# Patient Record
Sex: Male | Born: 1965 | Race: White | Hispanic: No | Marital: Single | Smoking: Current every day smoker
Health system: Southern US, Community
[De-identification: ages and names within clinical notes are randomized; demographics above are authoritative.]

## PROBLEM LIST (undated history)

## (undated) DIAGNOSIS — E785 Hyperlipidemia, unspecified: Secondary | ICD-10-CM

## (undated) DIAGNOSIS — D496 Neoplasm of unspecified behavior of brain: Secondary | ICD-10-CM

## (undated) DIAGNOSIS — F419 Anxiety disorder, unspecified: Secondary | ICD-10-CM

## (undated) DIAGNOSIS — R202 Paresthesia of skin: Secondary | ICD-10-CM

## (undated) DIAGNOSIS — K219 Gastro-esophageal reflux disease without esophagitis: Secondary | ICD-10-CM

## (undated) DIAGNOSIS — D330 Benign neoplasm of brain, supratentorial: Secondary | ICD-10-CM

## (undated) DIAGNOSIS — I10 Essential (primary) hypertension: Secondary | ICD-10-CM

## (undated) HISTORY — DX: Paresthesia of skin: R20.2

## (undated) HISTORY — PX: TOTAL HIP ARTHROPLASTY: SHX124

## (undated) HISTORY — PX: BRAIN SURGERY: SHX531

## (undated) HISTORY — PX: EYE SURGERY: SHX253

---
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
## 1998-02-11 ENCOUNTER — Emergency Department (HOSPITAL_COMMUNITY): Admission: EM | Admit: 1998-02-11 | Discharge: 1998-02-11 | Payer: Self-pay | Admitting: Emergency Medicine

## 1999-05-18 ENCOUNTER — Encounter: Payer: Self-pay | Admitting: Gastroenterology

## 1999-05-18 ENCOUNTER — Ambulatory Visit (HOSPITAL_COMMUNITY): Admission: RE | Admit: 1999-05-18 | Discharge: 1999-05-18 | Payer: Self-pay | Admitting: Gastroenterology

## 2000-05-17 ENCOUNTER — Ambulatory Visit (HOSPITAL_COMMUNITY): Admission: RE | Admit: 2000-05-17 | Discharge: 2000-05-17 | Payer: Self-pay | Admitting: Endocrinology

## 2001-06-01 ENCOUNTER — Ambulatory Visit (HOSPITAL_COMMUNITY): Admission: RE | Admit: 2001-06-01 | Discharge: 2001-06-01 | Payer: Self-pay | Admitting: Endocrinology

## 2001-06-01 ENCOUNTER — Encounter: Payer: Self-pay | Admitting: Endocrinology

## 2001-06-08 ENCOUNTER — Encounter: Payer: Self-pay | Admitting: Neurological Surgery

## 2001-06-08 ENCOUNTER — Ambulatory Visit (HOSPITAL_COMMUNITY): Admission: RE | Admit: 2001-06-08 | Discharge: 2001-06-08 | Payer: Self-pay | Admitting: Neurological Surgery

## 2001-06-10 ENCOUNTER — Inpatient Hospital Stay (HOSPITAL_COMMUNITY): Admission: EM | Admit: 2001-06-10 | Discharge: 2001-06-16 | Payer: Self-pay | Admitting: Emergency Medicine

## 2001-06-11 ENCOUNTER — Encounter: Payer: Self-pay | Admitting: Endocrinology

## 2001-06-12 ENCOUNTER — Encounter: Payer: Self-pay | Admitting: Neurosurgery

## 2001-08-01 ENCOUNTER — Encounter: Payer: Self-pay | Admitting: Neurosurgery

## 2001-08-01 ENCOUNTER — Encounter: Admission: RE | Admit: 2001-08-01 | Discharge: 2001-08-01 | Payer: Self-pay | Admitting: Neurosurgery

## 2002-05-04 ENCOUNTER — Encounter: Payer: Self-pay | Admitting: Neurosurgery

## 2002-05-04 ENCOUNTER — Encounter: Admission: RE | Admit: 2002-05-04 | Discharge: 2002-05-04 | Payer: Self-pay | Admitting: Neurosurgery

## 2005-12-19 ENCOUNTER — Encounter: Admission: RE | Admit: 2005-12-19 | Discharge: 2005-12-19 | Payer: Self-pay | Admitting: Orthopedic Surgery

## 2007-02-02 ENCOUNTER — Emergency Department (HOSPITAL_COMMUNITY): Admission: EM | Admit: 2007-02-02 | Discharge: 2007-02-02 | Payer: Self-pay | Admitting: Emergency Medicine

## 2007-04-24 ENCOUNTER — Encounter: Admission: RE | Admit: 2007-04-24 | Discharge: 2007-04-24 | Payer: Self-pay | Admitting: Neurosurgery

## 2007-06-19 ENCOUNTER — Inpatient Hospital Stay (HOSPITAL_COMMUNITY): Admission: RE | Admit: 2007-06-19 | Discharge: 2007-06-26 | Payer: Self-pay | Admitting: Orthopaedic Surgery

## 2007-06-22 ENCOUNTER — Ambulatory Visit: Payer: Self-pay | Admitting: Physical Medicine & Rehabilitation

## 2007-08-24 ENCOUNTER — Encounter: Admission: RE | Admit: 2007-08-24 | Discharge: 2007-10-31 | Payer: Self-pay | Admitting: Orthopaedic Surgery

## 2010-06-23 NOTE — Op Note (Signed)
NAMEHAIM, HANSSON               ACCOUNT NO.:  [id number]   MEDICAL RECORD NO.:  [id number]          PATIENT TYPE:  INP   LOCATION:  5025                         FACILITY:  [facility]   PHYSICIAN:  Mark C. Ophelia Charter, M.D.    DATE OF BIRTH:  [birth year]   DATE OF PROCEDURE:  06/19/2007  DATE OF DISCHARGE:                               OPERATIVE REPORT   PREOPERATIVE DIAGNOSIS:  Left hip avascular necrosis.   POSTOPERATIVE DIAGNOSIS:  Left hip avascular necrosis.   PROCEDURE:  1. Left hip press-fit monopolar hemiarthroplasty.  2. De[place]uy [place] coated #6 stem with +0 neck and 52-mm head.   SURGEON:  Annell Greening, MD   ASSISTANT:  Wende Neighbors, PA-C   ANESTHESIA:  GOT plus Marcaine local.   ESTIMATED BLOOD LOSS:  300 mL.   DRAINS:  None.   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  He had bilateral AVN and has been in a  wheelchair for greater than 6 months getting up to go to the bathroom.  X-rays and MRI shows whole head involvement with significant collapse on  the left without significant acetabular changes.   PROCEDURE:  After induction of general anesthesia orotracheal  intubation, the patient was placed in the lateral position with the left  hip up.  Standard prep and drape was performed usual impervious  stockinette, Cobans, split sheets, sterile skin marker, Betadine and  Biodrape.  Foley catheter was placed prior to the patient being turned  to the lateral position in prepping and draping.  Time-out checklist was  completed.  Surgical site position was marked and confirmed images were  displayed.  Posterior incision was made, gluteus maximus split in line  with the fibers.  Gluteus maximus was thick as expected for a young 52-  year-old.  Tensor fascia was nicked, extended in line with the gluteus  maximus fascia and a Charnley retractor placed.   Short external rotators  were divided.  Piriformis was tagged for later repair.  Posterior  capsule was opened, rushed fluid clear yellow, no purulence was seen.  Hip was internally rotated.  Neck was cut one fingerbreadth above the  lesser trochanter and head was moved with a corkscrew.  The articular  piece was still left inside the acetabulum and was peeled off the head  about 4 mm thick and was still attached to some capsule.  The remaining  head was excised severely deformed barely able to be measured.  A 52 mm  trials were tried with 50, 52, 54; 52 gave the best suction fit.  Neck  was recut, playing down, canal starter reamer and broaching power  reaming up to 6 broaching to #6 with excellent fit.  Lateral  trochanteric reamer was used.  There were some spurs noted on the neck  that were trimmed back and the acetabular cartilage looked good.  Labrum  was preserved.  Trial with zero was attempted but was extremely tight.  The  patient was not relaxed and muscle relaxant was given by the  anesthesia team to paralyze the patient until he had no twitches.  A -3  was placed and left for a period of time.  There was excellent stability  of flexion, internal rotation to 90 degrees Weber, hip flexors were  tight due to the long time for the patient's onset of symptoms of  presentation.  Quad was not tight, it easily flexed past 90 degrees.  He  was stable in extension and external rotation anteriorly.  Anterior  capsule was divided partially which improved hip flexion contracture,  rectus femoris was not divided.  No further anterior capsule division  was performed and then the permanent stem was placed with +0 neck, 52 mm  ball reduced with identical findings of stability, improvement in the  hip flexure but he still was tight with hip flexion contracture.  When  he was brought out to extension, the quad easily flexed past 90 degrees.  The iliopsoas was tight.  The abductor was not tight  and did not require  release.  After irrigation with saline solution, piriformis was repaired  back to the gluteus medius tendon.  Posterior capsule was not able to be  repaired since the patient had lost some height with AVN and collapse,  with restoration of the capsule was stretched out.  One suture was  placed in it side-to-side.  It was not sutured back down to the bone.  Tensor fascia was repaired with no #1 Tycron and 0 Vicryl on the gluteus  maximus fascia, 2-0 Vicryl on the subcu tissue, subcuticular skin  closure.  Marcaine infiltration.  Tincture of Benzoin, Steri-Strips  postop dressing and knee immobilizer.  Instrument count and needle count  was correct.      Mark C. Ophelia Charter, M.D.  Electronically Signed     MCY/MEDQ  D:  06/19/2007  T:  06/20/2007  Job:  [id number]

## 2010-06-26 NOTE — Procedures (Signed)
[name]  Patient:    Herbert Williams, Herbert Williams Visit Number: [id number] MRN: [id number]          Service Type: MED Location: 3000 3028 01 Attending Physician:  Melody Haver Dictated by:   Petra Kuba, M.D. Proc. Date: 06/12/01 Admit Date:  06/10/2001   CC:         Alfonse Alpers. Dagoberto Ligas, M.D.  John C. Madilyn Fireman, M.D.   Procedure Report  PROCEDURE PERFORMED:  Esophagogastroduodenoscopy.  ENDOSCOPIST:  Petra Kuba, M.D.  INDICATIONS FOR PROCEDURE:  Recurring nausea and vomiting.  Consent was signed after risks, benefits, methods, and options were thoroughly discussed by myself with the father and the patient as well as by Dr. Randa Evens over the weekend.  MEDICATIONS USED:  Demerol 30 mg, Versed 4 mg.  DESCRIPTION OF PROCEDURE:  Video endoscope was inserted by direct vision. Proximal and mid esophagus was normal.  In the distal esophagus was a small medium-sized hiatal hernia with a few small shallow linear probable Mallory-Weiss tears.  Very minimal heme was seen at this juncture.  The scope passed into the stomach and advanced to a normal antrum, normal pylorus, into a normal duodenal bulb and around the C-loop to a normal second portion of the duodenum.  The scope was withdrawn back to the bulb and a good look there ruled out abnormalities in that location.  Scope was withdrawn back to the stomach and retroflexed.  Angularis, fundus, greater and lesser curve were normal.  High in the cardia, the hiatal hernia was confirmed.  The scope was straightened.  On straight visualization the stomach was normal.  Air was suctioned.  The scope slowly withdrawn, again confirming the hiatal hernia and the small linear Mallory-Weiss tears without active bleeding.  Air and water were suctioned and the scope further withdrawn.  The remainder of the esophagus was normal.  The patient tolerated the procedure well.  There was no obvious immediate  complication.  ENDOSCOPIC DIAGNOSIS: 1. Small to medium-size hiatal hernia with a Mallory-Weiss tear. 2. Otherwise normal EGD.  PLAN:  Change him to p.o. b.i.d. pump inhibitors.  If doing well in the future can wean to once a day.  Dr. Madilyn Fireman to see tomorrow, will go ahead and slowly readvance his diet. Dictated by:   Petra Kuba, M.D. Attending Physician:  Melody Haver DD:  06/12/01 TD:  06/13/01 Job: [id number]

## 2010-06-26 NOTE — Consult Note (Signed)
[place]. [facility]  Patient:    Herbert Williams, Herbert Williams Visit Number: [id number] MRN: [id number]          Service Type: MED Location: 3000 3028 01 Attending Physician:  Melody Haver Dictated by:   Llana Aliment. Randa Evens, M.D. Admit Date:  06/10/2001   CC:         John C. Madilyn Fireman, M.D.  Alfonse Alpers. Dagoberto Ligas, M.D.  Stefani Dama, M.D.   Consultation Report  DATE OF BIRTH:  [birth year]  REASON FOR CONSULTATION:  Hematemesis.  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 He has done fairly well since then. He has had fairly significant deficits with ataxia, tremor, but no mental deficits. He has had no progression of his neurologic deficits, has been fairly stable. He has had a right cranial nerve palsy, marked hyperflexia. Most recently, he has had nausea and vomiting according to he and his mother. Nausea and vomiting has been going on about two weeks intermittently, has been fairly persistent for the previous several days. He came in to the emergency room yesterday after vomiting up blood yesterday morning and again yesterday evening. It was bright red and it occurred after he had already vomited some. His pertinent labs in the emergency room reveal normal electrolytes with a hemoglobin of 16.4. The patient has not had any abdominal pain. His main symptoms have been headache and nausea. He apparently has had an MRI done just a couple of days ago showing possible increased pressure. He has a VP shunt in place and Dr. Danielle Dess is following this. Due to his hematemesis, we are asked to see him. He does have history of chronic esophageal reflux, but symptoms have been stable on Prevacid.  CURRENT MEDICATIONS:  Darvocet, Prevacid. He has not been on any nonsteroidals whatsoever. I have gone over the names of a number of them and he has not taken any of them.  ALLERGIES:   PENICILLIN.  PAST MEDICAL HISTORY:  Status post resection of a choroid plexus papilloma. Neurological deficit, stable since age 45 with recent nausea and vomiting and possible increased intracranial pressure. He does have a VP shunt in place and apparently has had this replaced several times over the years but has never had any other abdominal surgery. Other problems include sinus arrhythmia with tachycardia, hypertension, hyperlipidemia, chronic constipation, gastroesophageal reflux.  FAMILY HISTORY:  Not pertinent. The patient is adopted. Family history unknown.  PHYSICAL EXAMINATION:  VITAL SIGNS:  Temperature 96.7, pulse 84, blood pressure 146/87.  GENERAL:  A very pleasant, oriented, and alert white male who has obvious neurological impairment with obvious gross abnormalities of his eyes and mouth and facial muscles.  NEUROLOGIC:  Formal neurologic exam was not performed.  HEENT:  Eyes:  Sclerae nonicteric.  LUNGS:  Clear.  HEART:  Regular rate and rhythm, no murmurs or gallops.  ABDOMEN:  Soft, completely nontender, nondistended. I cannot palpate any areas of tenderness.  LABORATORY DATA:  Pertinent data base:  White count 18,900, hemoglobin 16, renal function tests all unremarkable.  DIAGNOSTIC DATA:  Gallbladder ultrasound obtained just prior to this exam. I have seen the report and have not been able to study the films as of yet.  ASSESSMENT: 1. Hematemesis. This occurred two times. The patient is chronically on    Prevacid for reflux and I think more than likely the hematemesis was due  to a Mallory-Weiss ______. Does not seem to have any risk factors who    uninvolves him, etc. I do think we need to be certain that the cause of his    nausea and vomiting is not due to an intra-abdominal process such as    gallstones or ulcer. However, I do think the overall picture appears that    this is less likely. 2. Nausea and vomiting. Apparently at this point in time,  due to increase in    intracranial pressure. 3. 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 4. Stable neurological deficits.  PLAN:  Will proceed with endoscopy tomorrow by myself or one of my partners. Will go ahead and prophylax with Cipro due to his VP shunt. Will continue him IV Protonix. Dictated by:   Llana Aliment. Randa Evens, M.D. Attending Physician:  Melody Haver DD:  06/11/01 TD:  06/12/01 Job: [id number]

## 2010-06-26 NOTE — H&P (Signed)
[place]. [facility]  Patient:    Herbert Williams, Herbert Williams Visit Number: [id number] MRN: [id number]          Service Type: MED Location: 3000 3028 01 Attending Physician:  Melody Haver Dictated by:   Alfonse Alpers Dagoberto Ligas, M.D. Admit Date:  06/10/2001                           History and Physical  CHIEF COMPLAINT:  This is a 45 year old man who presents with a history of persistent vomiting.  HISTORY OF PRESENT ILLNESS:  This patient had a choroid plexus papilloma resected in 1978.  At that time, he became somewhat debilitated, using a walker for stability and having a moderate tremor and ataxia.  He has been doing reasonably well but has had a history of gastroesophageal reflux disease and has been followed by the gastroenterologist.  He has probably not had an endoscopic examination.  Approximately two weeks prior to this admission, he began having more and more symptoms of persistent vomiting and discomfort. However, associated with that has been a progression of right-sided headache. An MRI was done which showed a questionable increase in pressure in the fourth ventricle system with a question of increased ventricular pressure.  He has been followed by the neurosurgeons, however, and no definite diagnosis has been made.  Apparently, more laboratory studies have been done including MRI scanning, however, no definite diagnosis has been made.  During the last five days, he has had persistent vomiting which has persisted and it has increased to the point where now he is vomiting black coffee-grounds material.  He presents now with persistent vomiting and hematemesis.  PAST MEDICAL HISTORY:  His past medical history is essentially negative except for his choroid plexus papilloma resection in 1978.  MEDICATIONS PRIOR TO THIS ADMISSION:  Prevacid 30 mg one q.d.  FAMILY HISTORY:  Noncontributory.  PERSONAL HISTORY:  He lives with his family.  He denies  excessive alcohol intake.  He is moderately active.  ALLERGIES:  He has an allergy to PENICILLIN.  REVIEW OF SYSTEMS:  His weight has been stable.  CARDIOVASCULAR/RESPIRATORY: No complaints.  GI:  See above.    PHYSICAL EXAMINATION:  GENERAL:  This is a well-developed man who appears in no acute distress.  SKIN:  Warm.  HEENT:  His head is normocephalic.  NECK:  Supple.  LUNGS:  Clear.  CARDIOVASCULAR:  Cardiovascular reveals the rhythm to be regular.  ABDOMEN:  Soft.  No masses or tenderness are present.  EXTREMITIES:  Normal.  NEUROMUSCULAR:  Essentially unchanged.  He has the same speech impediment that he has had previously.  His pupils are mid-size.  He has moderate ataxia bilaterally.  IMPRESSION: 1. Gastrointestinal bleeding probably from a Mallory-Weiss tear. 2. Rule out intracerebral pressure resulting in vomiting. Dictated by:   Alfonse Alpers. Dagoberto Ligas, M.D. Attending Physician:  Melody Haver DD:  06/10/01 TD:  06/11/01 Job: [id number]

## 2010-06-26 NOTE — Discharge Summary (Signed)
Herbert Williams, Herbert Williams               ACCOUNT NO.:  [id number]   MEDICAL RECORD NO.:  [id number]          PATIENT TYPE:  INP   LOCATION:  5025                         FACILITY:  [facility]   PHYSICIAN:  Mark C. Ophelia Charter, M.D.    DATE OF BIRTH:  04-22-1965   DATE OF ADMISSION:  06/19/2007  DATE OF DISCHARGE:  06/26/2007                               DISCHARGE SUMMARY   ADMISSION DIAGNOSES:  1. Avascular necrosis of the left hip.  2. History of brain tumor, status post craniotomy in 1978 with shunt      placement.  Shunt revisions in 1985 and 2003.  3. Hypertension  4. Gastroesophageal reflux disease.  5. Tobacco abuse.   DISCHARGE DIAGNOSES:  1. Avascular necrosis of the left hip.  2. History of brain tumor status post craniotomy 1978 with shunt      placement.  Shunt revisions in 1985 and 2003.  3. Hypertension  4. Gastroesophageal reflux disease.  5. Tobacco abuse.  6. Mild posthemorrhagic anemia.   PROCEDURE:  On Jun 19, 2007, the patient underwent left hip press-fit  monopolar hemiarthroplasty performed by Dr. Ophelia Charter, assisted by Maud Deed PA-C under general anesthesia.   CONSULTATIONS:  Dr. Thomasena Edis of [facility]   BRIEF HISTORY:  The patient is a 45 year old male with history of  cranial surgery for brain stem tumor.  He has had alcohol abuse over the  years drinking as much as a 12-pack a day.  He has had chronic and  progressive left hip pain refractory to conservative treatment.  He has  been wheelchair-bound for greater than 6 months secondary to the pain in  his hip.  MRI scan of the left hip has indicated whole-head involvement  with significant collapse of the left femoral head without significant  acetabular changes.  He has bilateral avascular necrosis of the hips.  It was felt in order to allow him mobility once again.  He would require  surgical intervention in the form of the left hip hemiarthroplasty.  He  was admitted for this  procedure.   BRIEF HOSPITAL COURSE:  The patient tolerated the procedure under  general anesthesia without complications.  Postoperatively, he was  placed on Coumadin for DVT prophylaxis.  The patient's INR was at 2.3  after 1 dose of Coumadin.  Coumadin was held secondary to a rapid  increase in INR initially and he eventually was placed back on the  Coumadin.  Adjustments in dosage were made according to protimes by the  pharmacist at [facility].  Pain was well controlled with p.o. analgesics  prior to discharge.  He was afebrile and vital signs stable through the  hospital stay.  Initially, it was felt he may require inpatient  rehabilitation as he was somewhat slow to advance.  His largest deficit  was that of balance secondary to his previous brain tumor.  He does live  with his family and unfortunately he was not felt to be a candidate for  inpatient rehabilitation.  Arrangements were made for home health  physical therapy and occupational therapy as well as a  bath aide.  The  patient prior to discharge was able to ambulate 80 feet with a rolling  walker.  The patient was instructed in total hip replacement  precautions.  Dressing was changed daily during the hospital stay and  wound was healing without difficulties.  The patient was instructed in  wound care for home purposes.   PERTINENT LABORATORY VALUES:  EKG on admission normal sinus rhythm.  CBC  on admission, hemoglobin and hematocrit 16.1 and 48 respectively.  At  discharge, 13.6 and 39.1, white blood cell count on admission was 11.8.  On Jun 21, 2007, 13.2.  INR at discharge 2.8.  Chemistry studies on  admission within normal limits, repeat on Jun 20, 2007, with values  normal with exception of glucose 124.  Urinalysis on admission negative  for urinary tract infection.   PLAN:  Arrangements were made for the patient to have necessary durable  medical equipment, home health physical therapy 3 times weekly for 3  weeks  as well as the home health nurse for weekly protimes and  occupational therapy for ADLs.  He is allowed to full weightbearing on  the operative extremity.  He should have range of motion, strengthening,  and stretching exercises.  He is allowed to shower.  Dressing change to  be done daily.  He has on Coumadin for DVT prophylaxis and arrangements  made for Coumadin management through the advanced home care.   MEDICATIONS AT DISCHARGE:  The patient will resume his home medications  as taken prior to admission and medication reconciliation form was given  to the patient with these instructions.  Tylox 1-2 every 6-8 hours as  needed for pain and Coumadin 2.5 mg Wednesday and Sunday with 5 mg on  all other days for a total of 4 weeks.  The patient will follow up 2  weeks in the office with Dr. Ophelia Charter.  All questions encouraged and  answered.   CONDITION ON DISCHARGE:  Stable.      Wende Neighbors, P.A.      Mark C. Ophelia Charter, M.D.  Electronically Signed    SMV/MEDQ  D:  07/26/2007  T:  07/27/2007  Job:  [id number]

## 2010-11-04 LAB — CBC
MCHC: 33.8
Platelets: 222
Platelets: 235
Platelets: 247
RDW: 12.9
RDW: 13.2
RDW: 13.2
WBC: 11.8 — ABNORMAL HIGH

## 2010-11-04 LAB — BASIC METABOLIC PANEL
BUN: 8
CO2: 32
Calcium: 9.3
Creatinine, Ser: 0.9
GFR calc Af Amer: >60
Glucose, Bld: 124 — ABNORMAL HIGH

## 2010-11-04 LAB — COMPREHENSIVE METABOLIC PANEL
ALT: 43
BUN: 10
Calcium: 10.2
Chloride: 97
Creatinine, Ser: 0.81
GFR calc Af Amer: >60
GFR calc non Af Amer: >60
Glucose, Bld: 94
Potassium: 5
Sodium: 136

## 2010-11-04 LAB — PROTIME-INR
INR: 0.9
INR: 1
INR: 2.3 — ABNORMAL HIGH
INR: 2.2 — ABNORMAL HIGH
INR: 2.8 — ABNORMAL HIGH
Prothrombin Time: 12.5
Prothrombin Time: 13
Prothrombin Time: 24.7 — ABNORMAL HIGH
Prothrombin Time: 29 — ABNORMAL HIGH
Prothrombin Time: 30.3 — ABNORMAL HIGH

## 2010-11-04 LAB — URINALYSIS, ROUTINE W REFLEX MICROSCOPIC
Bilirubin Urine: NEGATIVE
Hgb urine dipstick: NEGATIVE
Protein, ur: NEGATIVE
Urobilinogen, UA: 1

## 2010-11-04 LAB — DIFFERENTIAL
Eosinophils Absolute: 0.5
Lymphocytes Relative: 16
Lymphs Abs: 1.9
Neutrophils Relative %: 73

## 2010-11-04 LAB — HEMOGLOBIN AND HEMATOCRIT, BLOOD: Hemoglobin: 13.6

## 2013-09-10 ENCOUNTER — Emergency Department (HOSPITAL_COMMUNITY): Payer: Medicare HMO

## 2013-09-10 ENCOUNTER — Encounter (HOSPITAL_COMMUNITY): Payer: Self-pay | Admitting: Emergency Medicine

## 2013-09-10 ENCOUNTER — Inpatient Hospital Stay (HOSPITAL_COMMUNITY)
Admission: EM | Admit: 2013-09-10 | Discharge: 2013-09-13 | DRG: 481 | Disposition: A | Discharge status: Skilled Nursing Facility | Payer: Medicare HMO | Attending: Internal Medicine | Admitting: Internal Medicine

## 2013-09-10 DIAGNOSIS — S72413A Displaced unspecified condyle fracture of lower end of unspecified femur, initial encounter for closed fracture: Principal | ICD-10-CM | POA: Diagnosis present

## 2013-09-10 DIAGNOSIS — R29818 Other symptoms and signs involving the nervous system: Secondary | ICD-10-CM | POA: Diagnosis present

## 2013-09-10 DIAGNOSIS — F17200 Nicotine dependence, unspecified, uncomplicated: Secondary | ICD-10-CM | POA: Diagnosis present

## 2013-09-10 DIAGNOSIS — D72829 Elevated white blood cell count, unspecified: Secondary | ICD-10-CM | POA: Diagnosis present

## 2013-09-10 DIAGNOSIS — R29810 Facial weakness: Secondary | ICD-10-CM | POA: Diagnosis present

## 2013-09-10 DIAGNOSIS — M87059 Idiopathic aseptic necrosis of unspecified femur: Secondary | ICD-10-CM | POA: Diagnosis present

## 2013-09-10 DIAGNOSIS — E785 Hyperlipidemia, unspecified: Secondary | ICD-10-CM | POA: Diagnosis present

## 2013-09-10 DIAGNOSIS — IMO0002 Reserved for concepts with insufficient information to code with codable children: Secondary | ICD-10-CM

## 2013-09-10 DIAGNOSIS — R279 Unspecified lack of coordination: Secondary | ICD-10-CM | POA: Diagnosis present

## 2013-09-10 DIAGNOSIS — I10 Essential (primary) hypertension: Secondary | ICD-10-CM | POA: Diagnosis present

## 2013-09-10 DIAGNOSIS — R259 Unspecified abnormal involuntary movements: Secondary | ICD-10-CM | POA: Diagnosis present

## 2013-09-10 DIAGNOSIS — Z96649 Presence of unspecified artificial hip joint: Secondary | ICD-10-CM | POA: Diagnosis not present

## 2013-09-10 DIAGNOSIS — S82142A Displaced bicondylar fracture of left tibia, initial encounter for closed fracture: Secondary | ICD-10-CM

## 2013-09-10 DIAGNOSIS — W010XXA Fall on same level from slipping, tripping and stumbling without subsequent striking against object, initial encounter: Secondary | ICD-10-CM | POA: Diagnosis present

## 2013-09-10 DIAGNOSIS — K219 Gastro-esophageal reflux disease without esophagitis: Secondary | ICD-10-CM | POA: Diagnosis present

## 2013-09-10 DIAGNOSIS — S82142P Displaced bicondylar fracture of left tibia, subsequent encounter for closed fracture with malunion: Secondary | ICD-10-CM

## 2013-09-10 DIAGNOSIS — Z880 Allergy status to penicillin: Secondary | ICD-10-CM | POA: Diagnosis not present

## 2013-09-10 DIAGNOSIS — S72412A Displaced unspecified condyle fracture of lower end of left femur, initial encounter for closed fracture: Secondary | ICD-10-CM

## 2013-09-10 DIAGNOSIS — S82109A Unspecified fracture of upper end of unspecified tibia, initial encounter for closed fracture: Secondary | ICD-10-CM | POA: Diagnosis present

## 2013-09-10 DIAGNOSIS — S82143A Displaced bicondylar fracture of unspecified tibia, initial encounter for closed fracture: Secondary | ICD-10-CM

## 2013-09-10 DIAGNOSIS — Z86011 Personal history of benign neoplasm of the brain: Secondary | ICD-10-CM

## 2013-09-10 DIAGNOSIS — F1010 Alcohol abuse, uncomplicated: Secondary | ICD-10-CM | POA: Diagnosis present

## 2013-09-10 DIAGNOSIS — S8253XA Displaced fracture of medial malleolus of unspecified tibia, initial encounter for closed fracture: Secondary | ICD-10-CM | POA: Diagnosis present

## 2013-09-10 DIAGNOSIS — M25569 Pain in unspecified knee: Secondary | ICD-10-CM | POA: Diagnosis present

## 2013-09-10 DIAGNOSIS — D330 Benign neoplasm of brain, supratentorial: Secondary | ICD-10-CM | POA: Diagnosis present

## 2013-09-10 HISTORY — DX: Essential (primary) hypertension: I10

## 2013-09-10 HISTORY — DX: Hyperlipidemia, unspecified: E78.5

## 2013-09-10 HISTORY — DX: Benign neoplasm of brain, supratentorial: D33.0

## 2013-09-10 HISTORY — DX: Gastro-esophageal reflux disease without esophagitis: K21.9

## 2013-09-10 HISTORY — DX: Neoplasm of unspecified behavior of brain: D49.6

## 2013-09-10 LAB — CBC
HCT: 43.1 % (ref 39.0–52.0)
Hemoglobin: 14.5 g/dL (ref 13.0–17.0)
MCH: 32.5 pg (ref 26.0–34.0)
MCHC: 33.6 g/dL (ref 30.0–36.0)
MCV: 96.6 fL (ref 78.0–100.0)
Platelets: 237 10*3/uL (ref 150–400)
RBC: 4.46 MIL/uL (ref 4.22–5.81)
RDW: 12.6 % (ref 11.5–15.5)
WBC: 14.1 10*3/uL — ABNORMAL HIGH (ref 4.0–10.5)

## 2013-09-10 LAB — SURGICAL PCR SCREEN
MRSA, PCR: NEGATIVE
STAPHYLOCOCCUS AUREUS: NEGATIVE

## 2013-09-10 LAB — BASIC METABOLIC PANEL
Anion gap: 13 (ref 5–15)
BUN: 12 mg/dL (ref 6–23)
CO2: 27 mEq/L (ref 19–32)
Calcium: 10.1 mg/dL (ref 8.4–10.5)
Chloride: 99 mEq/L (ref 96–112)
Creatinine, Ser: 0.72 mg/dL (ref 0.50–1.35)
GFR calc Af Amer: >90 mL/min (ref >90)
GFR calc non Af Amer: >90 mL/min (ref >90)
Glucose, Bld: 110 mg/dL — ABNORMAL HIGH (ref 70–99)
Potassium: 4.3 mEq/L (ref 3.7–5.3)
Sodium: 139 mEq/L (ref 137–147)

## 2013-09-10 LAB — URINALYSIS, ROUTINE W REFLEX MICROSCOPIC
Bilirubin Urine: NEGATIVE
Glucose, UA: NEGATIVE mg/dL
Hgb urine dipstick: NEGATIVE
KETONES UR: NEGATIVE mg/dL
LEUKOCYTES UA: NEGATIVE
NITRITE: NEGATIVE
PH: 6 (ref 5.0–8.0)
Protein, ur: NEGATIVE mg/dL
Specific Gravity, Urine: 1.023 (ref 1.005–1.030)
Urobilinogen, UA: 1 mg/dL (ref 0.0–1.0)

## 2013-09-10 MED ORDER — FENOFIBRATE 160 MG PO TABS
160.0000 mg | ORAL_TABLET | Freq: Every day | ORAL | Status: DC
  Administered 2013-09-11 – 2013-09-13 (×3): 160 mg via ORAL
  Filled 2013-09-10 (×4): qty 1

## 2013-09-10 MED ORDER — LORAZEPAM 2 MG/ML IJ SOLN
0.0000 mg | Freq: Two times a day (BID) | INTRAMUSCULAR | Status: DC
  Filled 2013-09-10: qty 1

## 2013-09-10 MED ORDER — ENOXAPARIN SODIUM 40 MG/0.4ML ~~LOC~~ SOLN
40.0000 mg | SUBCUTANEOUS | Status: DC
Start: 2013-09-10 — End: 2013-09-13
  Administered 2013-09-11 – 2013-09-12 (×2): 40 mg via SUBCUTANEOUS
  Filled 2013-09-10 (×4): qty 0.4

## 2013-09-10 MED ORDER — ADULT MULTIVITAMIN W/MINERALS CH
1.0000 | ORAL_TABLET | Freq: Every day | ORAL | Status: DC
  Administered 2013-09-11 – 2013-09-13 (×3): 1 via ORAL
  Filled 2013-09-10 (×3): qty 1

## 2013-09-10 MED ORDER — LORAZEPAM 2 MG/ML IJ SOLN: 1.0000 mg | Freq: Four times a day (QID) | INTRAMUSCULAR | Status: DC | PRN

## 2013-09-10 MED ORDER — PANTOPRAZOLE SODIUM 40 MG PO TBEC
40.0000 mg | DELAYED_RELEASE_TABLET | Freq: Every day | ORAL | Status: DC
  Administered 2013-09-12 – 2013-09-13 (×2): 40 mg via ORAL
  Filled 2013-09-10 (×2): qty 1

## 2013-09-10 MED ORDER — FOLIC ACID 1 MG PO TABS
1.0000 mg | ORAL_TABLET | Freq: Every day | ORAL | Status: DC
  Administered 2013-09-11 – 2013-09-13 (×3): 1 mg via ORAL
  Filled 2013-09-10 (×3): qty 1

## 2013-09-10 MED ORDER — MAGNESIUM CITRATE PO SOLN
1.0000 | Freq: Once | ORAL | Status: AC | PRN
Start: 2013-09-10 — End: 2013-09-10

## 2013-09-10 MED ORDER — ALBUTEROL SULFATE (2.5 MG/3ML) 0.083% IN NEBU: 2.5000 mg | INHALATION_SOLUTION | RESPIRATORY_TRACT | Status: DC | PRN

## 2013-09-10 MED ORDER — ALUM & MAG HYDROXIDE-SIMETH 200-200-20 MG/5ML PO SUSP: 30.0000 mL | Freq: Four times a day (QID) | ORAL | Status: DC | PRN

## 2013-09-10 MED ORDER — NICOTINE 21 MG/24HR TD PT24
21.0000 mg | MEDICATED_PATCH | Freq: Every day | TRANSDERMAL | Status: DC
  Filled 2013-09-10: qty 1

## 2013-09-10 MED ORDER — ONDANSETRON HCL 4 MG/2ML IJ SOLN: 4.0000 mg | Freq: Four times a day (QID) | INTRAMUSCULAR | Status: DC | PRN

## 2013-09-10 MED ORDER — ACETAMINOPHEN 650 MG RE SUPP
650.0000 mg | Freq: Four times a day (QID) | RECTAL | Status: DC | PRN
Start: 2013-09-10 — End: 2013-09-13

## 2013-09-10 MED ORDER — LORAZEPAM 2 MG/ML IJ SOLN
0.0000 mg | Freq: Four times a day (QID) | INTRAMUSCULAR | Status: AC
  Administered 2013-09-10 – 2013-09-12 (×4): 2 mg via INTRAVENOUS
  Filled 2013-09-10 (×3): qty 1

## 2013-09-10 MED ORDER — LORAZEPAM 1 MG PO TABS: 1.0000 mg | ORAL_TABLET | Freq: Four times a day (QID) | ORAL | Status: DC | PRN

## 2013-09-10 MED ORDER — MORPHINE SULFATE 2 MG/ML IJ SOLN
2.0000 mg | INTRAMUSCULAR | Status: DC | PRN
  Administered 2013-09-10 – 2013-09-11 (×2): 2 mg via INTRAVENOUS
  Filled 2013-09-10 (×2): qty 1

## 2013-09-10 MED ORDER — CARBOXYMETHYLCELLULOSE SODIUM 0.5 % OP SOLN: 2.0000 [drp] | Freq: Four times a day (QID) | OPHTHALMIC | Status: DC | PRN

## 2013-09-10 MED ORDER — NICOTINE 14 MG/24HR TD PT24
14.0000 mg | MEDICATED_PATCH | Freq: Every day | TRANSDERMAL | Status: DC
Start: 2013-09-10 — End: 2013-09-13
  Filled 2013-09-10 (×4): qty 1

## 2013-09-10 MED ORDER — POLYETHYLENE GLYCOL 3350 17 G PO PACK: 17.0000 g | PACK | Freq: Every day | ORAL | Status: DC | PRN

## 2013-09-10 MED ORDER — DOCUSATE SODIUM 100 MG PO CAPS
100.0000 mg | ORAL_CAPSULE | Freq: Two times a day (BID) | ORAL | Status: DC
  Administered 2013-09-10 – 2013-09-13 (×6): 100 mg via ORAL
  Filled 2013-09-10 (×7): qty 1

## 2013-09-10 MED ORDER — VITAMIN B-1 100 MG PO TABS
100.0000 mg | ORAL_TABLET | Freq: Every day | ORAL | Status: DC
Start: 2013-09-10 — End: 2013-09-13
  Administered 2013-09-11 – 2013-09-13 (×3): 100 mg via ORAL
  Filled 2013-09-10 (×3): qty 1

## 2013-09-10 MED ORDER — SODIUM CHLORIDE 0.9 % IV SOLN
INTRAVENOUS | Status: AC
  Administered 2013-09-10 – 2013-09-11 (×2): via INTRAVENOUS

## 2013-09-10 MED ORDER — THIAMINE HCL 100 MG/ML IJ SOLN
Freq: Once | INTRAVENOUS | Status: AC
  Administered 2013-09-10: 17:00:00 via INTRAVENOUS
  Filled 2013-09-10: qty 1000

## 2013-09-10 MED ORDER — POLYVINYL ALCOHOL 1.4 % OP SOLN
2.0000 [drp] | Freq: Four times a day (QID) | OPHTHALMIC | Status: DC | PRN
  Filled 2013-09-10: qty 15

## 2013-09-10 MED ORDER — ONDANSETRON HCL 4 MG PO TABS
4.0000 mg | ORAL_TABLET | Freq: Four times a day (QID) | ORAL | Status: DC | PRN
Start: 2013-09-10 — End: 2013-09-13

## 2013-09-10 MED ORDER — OXYCODONE HCL 5 MG PO TABS
5.0000 mg | ORAL_TABLET | ORAL | Status: DC | PRN
  Administered 2013-09-10 – 2013-09-13 (×13): 5 mg via ORAL
  Filled 2013-09-10 (×13): qty 1

## 2013-09-10 MED ORDER — IPRATROPIUM BROMIDE 0.02 % IN SOLN: 0.5000 mg | RESPIRATORY_TRACT | Status: DC | PRN

## 2013-09-10 MED ORDER — SORBITOL 70 % SOLN: 30.0000 mL | Freq: Every day | Status: DC | PRN

## 2013-09-10 MED ORDER — TRAMADOL HCL 50 MG PO TABS
50.0000 mg | ORAL_TABLET | Freq: Once | ORAL | Status: AC
  Administered 2013-09-10: 50 mg via ORAL
  Filled 2013-09-10: qty 1

## 2013-09-10 MED ORDER — ACETAMINOPHEN 325 MG PO TABS: 650.0000 mg | ORAL_TABLET | Freq: Four times a day (QID) | ORAL | Status: DC | PRN

## 2013-09-10 MED ORDER — THIAMINE HCL 100 MG/ML IJ SOLN
100.0000 mg | Freq: Every day | INTRAMUSCULAR | Status: DC
  Filled 2013-09-10 (×3): qty 1

## 2013-09-10 MED ORDER — ATENOLOL 50 MG PO TABS
50.0000 mg | ORAL_TABLET | Freq: Every day | ORAL | Status: DC
Start: 2013-09-11 — End: 2013-09-13
  Administered 2013-09-11 – 2013-09-13 (×3): 50 mg via ORAL
  Filled 2013-09-10 (×3): qty 1

## 2013-09-10 NOTE — H&P (Addendum)
[facility] History and Physical  Herbert Williams QMG:[id number] DOB: 31-Dec-1965 DOA: 09/10/2013  Referring physician: Dr Mingo Amber PCP: Mathews Argyle, MD   Chief Complaint: Left knee pain  HPI: Herbert Williams is a 48 y.o. male  With history of [place] in 1978 with baseline moderate tremor and ataxia who ambulates with a a walker, hyperlipidemia, probable hypertension, gastroesophageal reflux disease who presents to the ED with left knee pain. Patient states 1 day prior to admission he slipped on a paper towel and fell on his left side. Patient stated that he was able to get up and ambulate yesterday. However at 3 AM on the morning of admission patient woke up with a stiff leg with some swelling and pain. Patient subsequently presented to the ED. Patient states was unable to bear weight on his left lower extremity. Patient denies any fevers, no chills, no chest pain, no shortness of breath, no syncope, no nausea, no vomiting, no abdominal pain, no dysuria, no cough, no weakness. Patient was seen in the ED CBC done had a white count of 14.1 otherwise was within normal limits. Basic metabolic profile done was unremarkable. Extremity left hip showed avascular necrosis of the right hip and left hip replacement in good anatomic alignment. Left knee showed a fracture of the left femoral condyles with slight displacement of fracture fragment. Extraocular left tibia/fibula showed fracture of the left femoral condylar region no tibiofibular fracture detected. CT of the left knee showed mildly displaced intra-articular fracture involving the lateral femoral condyle posterolaterally. Nondisplaced fracture of the medial tibial plateau posteromedially. EDPA status she spoke with Dr. Percell Miller of orthopedics who requested patient be admitted to the medicine service and be transferred to [facility]. We were subsequently called to admit the patient.   Review of Systems: As per HPI o/w  negative. Constitutional:  No weight loss, night sweats, Fevers, chills, fatigue.  HEENT:  No headaches, Difficulty swallowing,Tooth/dental problems,Sore throat,  No sneezing, itching, ear ache, nasal congestion, post nasal drip,  Cardio-vascular:  No chest pain, Orthopnea, PND, swelling in lower extremities, anasarca, dizziness, palpitations  GI:  No heartburn, indigestion, abdominal pain, nausea, vomiting, diarrhea, change in bowel habits, loss of appetite  Resp:  No shortness of breath with exertion or at rest. No excess mucus, no productive cough, No non-productive cough, No coughing up of blood.No change in color of mucus.No wheezing.No chest wall deformity  Skin:  no rash or lesions.  GU:  no dysuria, change in color of urine, no urgency or frequency. No flank pain.  Musculoskeletal:  No joint pain or swelling. No decreased range of motion. No back pain.  Psych:  No change in mood or affect. No depression or anxiety. No memory loss.   Past Medical History  Diagnosis Date  . Brain tumor 1979  . 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 GERD (gastroesophageal reflux disease) 09/10/2013  . Hyperlipemia 09/10/2013  . HTN (hypertension) 09/10/2013   Past Surgical History  Procedure Laterality Date  . Brain surgery    . Total hip arthroplasty Left    Social History:  reports that he has been smoking Cigarettes.  He has been smoking about 0.25 packs per day. He does not have any smokeless tobacco history on file. He reports that he drinks about 12.6 ounces of alcohol per week. He reports that he does not use illicit drugs.  Allergies  Allergen Reactions  . Penicillins  Rash    History reviewed. No pertinent family history.   Prior to Admission medications   Medication Sig Start Date End Date Taking? Authorizing Provider  atenolol (TENORMIN) 50 MG tablet Take 50 mg by mouth daily.   Yes Historical Provider, MD   carboxymethylcellulose (REFRESH PLUS) 0.5 % SOLN 2 drops 4 (four) times daily as needed (dry eyes.).   Yes Historical Provider, MD  fenofibrate 160 MG tablet Take 160 mg by mouth daily.   Yes Historical Provider, MD  ibuprofen (ADVIL,MOTRIN) 200 MG tablet Take 200 mg by mouth every 6 (six) hours as needed for moderate pain.   Yes Historical Provider, MD  lansoprazole (PREVACID) 30 MG capsule Take 30 mg by mouth daily at 12 noon.   Yes Historical Provider, MD   Physical Exam: Filed Vitals:   09/10/13 1026 09/10/13 1524  BP: 149/78 138/78  Pulse: 78 78  Temp: 98 F (36.7 C)   TempSrc: Oral   Resp: 20 16  SpO2: 98% 98%    Wt Readings from Last 3 Encounters:  No data found for Wt    General:  Well-developed well-nourished sitting on gurney in no acute cardiopulmonary distress with a baseline tremor however speaking in full sentences.  Eyes: PERRLA,EOMI, normal lids, irises & conjunctiva ENT: grossly normal hearing, lips & tongue Neck: no LAD, masses or thyromegaly Cardiovascular: RRR, no m/r/g. No LE edema. Respiratory: CTA bilaterally, no w/r/r. Normal respiratory effort. Abdomen: soft, ntnd, positive bowel sounds, no rebound, no guarding Skin: Erythema noted around the left knee. Musculoskeletal: grossly normal tone BUE/BLE. Left knee with no deformity, erythema noted around the knee with some tenderness to palpation. No warmth. Left knee with full range of motion. Left ankle with full range of motion. Psychiatric: grossly normal mood and affect, speech fluent and appropriate Neurologic: Alert and oriented x3. Cranial nerves II through XII are grossly intact. No focal deficits.           Labs on Admission:  Basic Metabolic Panel:  Recent Labs Lab 09/10/13 1316  NA 139  K 4.3  CL 99  CO2 27  GLUCOSE 110*  BUN 12  CREATININE 0.72  CALCIUM 10.1   Liver Function Tests: No results found for this basename: AST, ALT, ALKPHOS, BILITOT, PROT, ALBUMIN,  in the last 168  hours No results found for this basename: LIPASE, AMYLASE,  in the last 168 hours No results found for this basename: AMMONIA,  in the last 168 hours CBC:  Recent Labs Lab 09/10/13 1316  WBC 14.1*  HGB 14.5  HCT 43.1  MCV 96.6  PLT 237   Cardiac Enzymes: No results found for this basename: CKTOTAL, CKMB, CKMBINDEX, TROPONINI,  in the last 168 hours  BNP (last 3 results) No results found for this basename: PROBNP,  in the last 8760 hours CBG: No results found for this basename: GLUCAP,  in the last 168 hours  Radiological Exams on Admission: Dg Hip Complete Left  09/10/2013   CLINICAL DATA:  Fall.  EXAM: LEFT HIP - COMPLETE 2+ VIEW  COMPARISON:  04/24/2007.  FINDINGS: Soft tissue structures are unremarkable. Gas pattern is nonspecific. Stool is noted throughout the colon. Pill fragments are noted over the abdomen. Catheters noted in stable position over the abdomen. Left hip replacement. Sclerosis and cystic changes right femoral head consistent with avascular necrosis. Loose bodies are noted over the right hip joint.  IMPRESSION: 1. No acute bony abnormality. 2. Avascular necrosis right hip. 3. Left hip replacement in good  anatomic alignment.   Electronically Signed   By: Marcello Moores  Register   On: 09/10/2013 12:08   Dg Tibia/fibula Left  09/10/2013   CLINICAL DATA:  Fall.  Ankle pain.  Soft tissue swelling laterally.  EXAM: LEFT TIBIA AND FIBULA - 2 VIEW  COMPARISON:  Left knee films same date. Left ankle films 02/02/2007.  FINDINGS: Fracture of the left femoral condylar region suspected as noted on the knee film report.  No tibia or fibular fracture is detected. There are degenerative changes of the lateral malleolar and distal tibiotalar region.  Spur at the level of the Achilles tendon insertion and plantar fashion origin.  IMPRESSION: Fracture of the left femoral condylar region suspected as noted on the knee film report.  No tibia or fibular fracture is detected. There are degenerative  changes of the lateral malleolar and distal tibiotalar region.   Electronically Signed   By: Chauncey Cruel M.D.   On: 09/10/2013 11:14   Ct Knee Left Wo Contrast  09/10/2013   CLINICAL DATA:  Posterior knee pain after falling yesterday. Evaluate lateral femoral condylar fracture.  EXAM: CT OF THE LEFT KNEE WITHOUT CONTRAST  TECHNIQUE: Multidetector CT imaging of the left knee was performed according to the standard protocol. Multiplanar CT image reconstructions were also generated.  COMPARISON:  Radiographs same date.  FINDINGS: The bones appear diffusely demineralized with the trabecular prominence. As demonstrated radiographically, there is a mildly displaced fracture involving the posterolateral aspect of the lateral femoral condyle. This demonstrates up to 4 mm of articular surface offset laterally. The lateral metaphyseal cortex is also mildly displaced.  The medial femoral condyle is intact. There is a nondisplaced fracture involving the medial tibial plateau posteromedially. The lateral tibial plateau and proximal fibula are intact. The patella is intact.  There is a small joint effusion without definite layering intra-articular fat. There is mild soft tissue edema surrounding the knee.  IMPRESSION: 1. Mildly displaced intra-articular fracture involving the lateral femoral condyle posterolaterally. There is mild articular surface offset as described. 2. Nondisplaced fracture of the medial tibial plateau posteromedially. 3. Prominent osseous demineralization for age suggesting possible metabolic disorder or disuse. Correlate clinically.   Electronically Signed   By: Camie Patience M.D.   On: 09/10/2013 12:44   Dg Knee Complete 4 Views Left  09/10/2013   CLINICAL DATA:  Fall.  Pain.  EXAM: LEFT KNEE - COMPLETE 4+ VIEW  COMPARISON:  None.  FINDINGS: Fracture of the left femoral condyles which is unusual and I suspect acute. Slight displacement of fracture fragment. Small joint effusion. Given the unusual  appearance, CT imaging may be considered to confirm that this is acute and better define the degree of displacement of the fracture fragment.  IMPRESSION: Fracture of the left femoral condyles which is unusual and I suspect acute. Slight displacement of fracture fragment. Small joint effusion. Given the unusual appearance, CT imaging may be considered to confirm that this is acute and better define the degree of displacement of the fracture fragment.   Electronically Signed   By: Chauncey Cruel M.D.   On: 09/10/2013 11:10    EKG: Not done  Assessment/Plan Principal Problem:   Fracture of femoral condyle, left, closed Active Problems:   Tibial plateau fracture, left   Fracture, tibial plateau   Choroid plexus papilloma: s/p resection 1978   GERD (gastroesophageal reflux disease)   Hyperlipemia   HTN (hypertension)   Leukocytosis  #1 left tibial plateau fracture/left fracture of the femoral condyle Secondary to  mechanical fall. EDPA stated she spoke with Dr. Percell Miller of orthopedics and patient will be taken to the operating room for repair tomorrow. Per ALT of.  #2 gastroesophageal reflux disease PPI.  #3 hypertension Continue home regimen of atenolol.  #4 leukocytosis Leukocytosis. Urinalysis is negative. Patient with no respiratory symptoms. Follow.  #5 hyperlipidemia Check a fasting lipid panel. Continue [facility].  #6 history of choroid plexus papilloma status post resection in 1978 Stable. PT/OT.  #7 Hx of ETOH use Per family patient drinks 3 large cans of beer daily and likely may need something to prevent withdrawal. Place on Ativan withdrawal CIWA protocol.  #8 Tobacco use Nicotine patch. Tobacco cessation  #9 Prophylaxis PPI for GI prophylaxis. Lovenox for DVT prophylaxis.  Code Status: Full DVT Prophylaxis:Lovenox Family Communication: Updated patient, mother and cousin at bedside. Disposition Plan: Admit to medsurg at [facility] per ortho  Time spent: 10  [place] MD [facility] Pager [phone]  **Disclaimer: This note may have been dictated with voice recognition software. Similar sounding words can inadvertently be transcribed and this note may contain transcription errors which may not have been corrected upon publication of note.**

## 2013-09-10 NOTE — Consult Note (Signed)
ORTHOPAEDIC CONSULTATION  REQUESTING PHYSICIAN: Eugenie Filler, MD  Chief Complaint: left knee injury  HPI: Herbert Williams is a 48 y.o. male who complains of  A mechanical fall and left knee pain. He ambulates with a walker the majority of the time per patient and mother.   Past Medical History  Diagnosis Date  . Brain tumor 1979  . 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 GERD (gastroesophageal reflux disease) 09/10/2013  . Hyperlipemia 09/10/2013  . HTN (hypertension) 09/10/2013   Past Surgical History  Procedure Laterality Date  . Brain surgery    . Total hip arthroplasty Left    History   Social History  . Marital Status: Single    Spouse Name: N/A    Number of Children: N/A  . Years of Education: N/A   Social History Main Topics  . Smoking status: Current Every Day Smoker -- 0.25 packs/day    Types: Cigarettes  . Smokeless tobacco: None  . Alcohol Use: 12.6 oz/week    21 Cans of beer per week     Comment: per family, patient drinks larger cans of beer.  . Drug Use: No  . Sexual Activity: None   Other Topics Concern  . None   Social History Narrative  . None   History reviewed. No pertinent family history. Allergies  Allergen Reactions  . Penicillins Rash   Prior to Admission medications   Medication Sig Start Date End Date Taking? Authorizing Provider  atenolol (TENORMIN) 50 MG tablet Take 50 mg by mouth daily.   Yes Historical Provider, MD  carboxymethylcellulose (REFRESH PLUS) 0.5 % SOLN 2 drops 4 (four) times daily as needed (dry eyes.).   Yes Historical Provider, MD  fenofibrate 160 MG tablet Take 160 mg by mouth daily.   Yes Historical Provider, MD  ibuprofen (ADVIL,MOTRIN) 200 MG tablet Take 200 mg by mouth every 6 (six) hours as needed for moderate pain.   Yes Historical Provider, MD  lansoprazole (PREVACID) 30 MG capsule Take 30 mg by mouth daily at 12 noon.   Yes  Historical Provider, MD   Dg Hip Complete Left  09/10/2013   CLINICAL DATA:  Fall.  EXAM: LEFT HIP - COMPLETE 2+ VIEW  COMPARISON:  04/24/2007.  FINDINGS: Soft tissue structures are unremarkable. Gas pattern is nonspecific. Stool is noted throughout the colon. Pill fragments are noted over the abdomen. Catheters noted in stable position over the abdomen. Left hip replacement. Sclerosis and cystic changes right femoral head consistent with avascular necrosis. Loose bodies are noted over the right hip joint.  IMPRESSION: 1. No acute bony abnormality. 2. Avascular necrosis right hip. 3. Left hip replacement in good anatomic alignment.   Electronically Signed   By: Marcello Moores  Register   On: 09/10/2013 12:08   Dg Tibia/fibula Left  09/10/2013   CLINICAL DATA:  Fall.  Ankle pain.  Soft tissue swelling laterally.  EXAM: LEFT TIBIA AND FIBULA - 2 VIEW  COMPARISON:  Left knee films same date. Left ankle films 02/02/2007.  FINDINGS: Fracture of the left femoral condylar region suspected as noted on the knee film report.  No tibia or fibular fracture is detected. There are degenerative changes of the lateral malleolar and distal tibiotalar region.  Spur at the level of the Achilles tendon insertion and plantar fashion origin.  IMPRESSION: Fracture of the left femoral condylar region suspected as noted on the knee film  report.  No tibia or fibular fracture is detected. There are degenerative changes of the lateral malleolar and distal tibiotalar region.   Electronically Signed   By: Chauncey Cruel M.D.   On: 09/10/2013 11:14   Ct Knee Left Wo Contrast  09/10/2013   CLINICAL DATA:  Posterior knee pain after falling yesterday. Evaluate lateral femoral condylar fracture.  EXAM: CT OF THE LEFT KNEE WITHOUT CONTRAST  TECHNIQUE: Multidetector CT imaging of the left knee was performed according to the standard protocol. Multiplanar CT image reconstructions were also generated.  COMPARISON:  Radiographs same date.  FINDINGS: The bones  appear diffusely demineralized with the trabecular prominence. As demonstrated radiographically, there is a mildly displaced fracture involving the posterolateral aspect of the lateral femoral condyle. This demonstrates up to 4 mm of articular surface offset laterally. The lateral metaphyseal cortex is also mildly displaced.  The medial femoral condyle is intact. There is a nondisplaced fracture involving the medial tibial plateau posteromedially. The lateral tibial plateau and proximal fibula are intact. The patella is intact.  There is a small joint effusion without definite layering intra-articular fat. There is mild soft tissue edema surrounding the knee.  IMPRESSION: 1. Mildly displaced intra-articular fracture involving the lateral femoral condyle posterolaterally. There is mild articular surface offset as described. 2. Nondisplaced fracture of the medial tibial plateau posteromedially. 3. Prominent osseous demineralization for age suggesting possible metabolic disorder or disuse. Correlate clinically.   Electronically Signed   By: Camie Patience M.D.   On: 09/10/2013 12:44   Dg Knee Complete 4 Views Left  09/10/2013   CLINICAL DATA:  Fall.  Pain.  EXAM: LEFT KNEE - COMPLETE 4+ VIEW  COMPARISON:  None.  FINDINGS: Fracture of the left femoral condyles which is unusual and I suspect acute. Slight displacement of fracture fragment. Small joint effusion. Given the unusual appearance, CT imaging may be considered to confirm that this is acute and better define the degree of displacement of the fracture fragment.  IMPRESSION: Fracture of the left femoral condyles which is unusual and I suspect acute. Slight displacement of fracture fragment. Small joint effusion. Given the unusual appearance, CT imaging may be considered to confirm that this is acute and better define the degree of displacement of the fracture fragment.   Electronically Signed   By: Chauncey Cruel M.D.   On: 09/10/2013 11:10    Positive ROS: All  other systems have been reviewed and were otherwise negative with the exception of those mentioned in the HPI and as above.  Labs cbc  Recent Labs  09/10/13 1316  WBC 14.1*  HGB 14.5  HCT 43.1  PLT 237    Labs inflam No results found for this basename: ESR, CRP,  in the last 72 hours  Labs coag No results found for this basename: INR, PT, PTT,  in the last 72 hours   Recent Labs  09/10/13 1316  NA 139  K 4.3  CL 99  CO2 27  GLUCOSE 110*  BUN 12  CREATININE 0.72  CALCIUM 10.1    Physical Exam: Filed Vitals:   09/10/13 2056  BP: 132/53  Pulse: 71  Temp: 98.2 F (36.8 C)  Resp: 16   General: Alert, no acute distress Cardiovascular: No pedal edema Respiratory: No cyanosis, no use of accessory musculature GI: No organomegaly, abdomen is soft and non-tender Skin: No lesions in the area of chief complaint Neurologic: Sensation intact distally Psychiatric: Patient is competent for consent with normal mood and affect Lymphatic:  No axillary or cervical lymphadenopathy  MUSCULOSKELETAL:  TTP at L knee. Distally NVI. Compartments soft Other extremities are atraumatic with painless ROM and NVI.  Assessment: Left lat chondyle fx.   Plan: Arthroscopic assisted CRPP of his chondyle Weight Bearing Status: NWB LLE PT VTE px: SCD's and ASA post op   Edmonia Lynch, D, MD Cell [phone]   09/10/2013 10:01 PM

## 2013-09-10 NOTE — ED Notes (Signed)
Pt states was walking with walker yesterday, slipped on piece of paper. C/o left leg pain from knee down to ankle.

## 2013-09-10 NOTE — ED Provider Notes (Signed)
CSN: [id number]     Arrival date & time 09/10/13  1017 History   First MD Initiated Contact with Patient 09/10/13 1026     Chief Complaint  Patient presents with  . Knee Pain  . Leg Pain     (Consider location/radiation/quality/duration/timing/severity/associated sxs/prior Treatment) HPI Pt is a 48yo male with hx of brain tumor in 1979 presenting to ED with c/o left knee and lower leg pain. Pt uses a walker and wheelchair to help get around at home. Reports falling on his left knee yesterday while in his room using his walker.  Pt feel from ground height.  Pain is constant, 5/10, worse when trying to stand. Reports taking ibuprofen w/o relief.  Family has accompanied pt and states he is normally able to stand on left leg w/o difficulty.  Pt does have hx of left total hip replacement in 2009 but denies pain in left hip.  Denies any other injuries as result of fall including head or neck trauma.    Past Medical History  Diagnosis Date  . Brain tumor 1979  . 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 GERD (gastroesophageal reflux disease) 09/10/2013  . Hyperlipemia 09/10/2013  . HTN (hypertension) 09/10/2013   Past Surgical History  Procedure Laterality Date  . Brain surgery    . Total hip arthroplasty Left    History reviewed. No pertinent family history. History  Substance Use Topics  . Smoking status: Current Every Day Smoker -- 0.25 packs/day    Types: Cigarettes  . Smokeless tobacco: Not on file  . Alcohol Use: 12.6 oz/week    21 Cans of beer per week     Comment: per family, patient drinks larger cans of beer.    Review of Systems  Musculoskeletal: Positive for arthralgias, gait problem ( chronic, uses wheelchair and walker) and myalgias. Negative for back pain, neck pain and neck stiffness.       Left knee and leg  Skin: Positive for color change ( red left knee ). Negative for wound.  Neurological: Negative  for weakness, numbness and headaches.  All other systems reviewed and are negative.     Allergies  Penicillins  Home Medications   Prior to Admission medications   Medication Sig Start Date End Date Taking? Authorizing Provider  atenolol (TENORMIN) 50 MG tablet Take 50 mg by mouth daily.   Yes Historical Provider, MD  carboxymethylcellulose (REFRESH PLUS) 0.5 % SOLN 2 drops 4 (four) times daily as needed (dry eyes.).   Yes Historical Provider, MD  fenofibrate 160 MG tablet Take 160 mg by mouth daily.   Yes Historical Provider, MD  ibuprofen (ADVIL,MOTRIN) 200 MG tablet Take 200 mg by mouth every 6 (six) hours as needed for moderate pain.   Yes Historical Provider, MD  lansoprazole (PREVACID) 30 MG capsule Take 30 mg by mouth daily at 12 noon.   Yes Historical Provider, MD   BP 137/87  Pulse 92  Temp(Src) 97.9 F (36.6 C) (Oral)  Resp 16  Ht 5\' 7"  (1.702 m)  Wt 174 lb (78.926 kg)  BMI 27.25 kg/m2  SpO2 99% Physical Exam  Nursing note and vitals reviewed. Constitutional: He is oriented to person, place, and time. He appears well-developed and well-nourished.  HENT:  Head: Normocephalic and atraumatic.  Eyes: EOM are normal.  Neck: Normal range of motion.  Cardiovascular: Normal rate.   Pulses:      Posterior tibial  pulses are 2+ on the left side.  Pulmonary/Chest: Effort normal.  Musculoskeletal: He exhibits tenderness. He exhibits no edema.  Left knee: no obvious deformity. Mild erythema with circumferential tenderness to knee and left lower leg.   No erythema or warmth of left calf. Soft compartments.  FROM left knee and ankle. No tenderness to left ankle. No tenderness of left hip.  Neurological: He is alert and oriented to person, place, and time.  Skin: Skin is warm and dry. There is erythema.  Skin in tact, no ecchymosis. Mild erythema of left knee  Psychiatric: He has a normal mood and affect. His behavior is normal.    ED Course  Procedures (including critical  care time) Labs Review Labs Reviewed  CBC - Abnormal; Notable for the following:    WBC 14.1 (*)    All other components within normal limits  BASIC METABOLIC PANEL - Abnormal; Notable for the following:    Glucose, Bld 110 (*)    All other components within normal limits  URINALYSIS, ROUTINE W REFLEX MICROSCOPIC - Abnormal; Notable for the following:    Color, Urine AMBER (*)    All other components within normal limits  BASIC METABOLIC PANEL - Abnormal; Notable for the following:    Glucose, Bld 106 (*)    All other components within normal limits  CBC - Abnormal; Notable for the following:    RBC 3.97 (*)    Hemoglobin 12.6 (*)    All other components within normal limits  SURGICAL PCR SCREEN  URINE CULTURE  PROTIME-INR  LIPID PANEL  MAGNESIUM  APTT  PROTIME-INR    Imaging Review Dg Hip Complete Left  09/10/2013   CLINICAL DATA:  Fall.  EXAM: LEFT HIP - COMPLETE 2+ VIEW  COMPARISON:  04/24/2007.  FINDINGS: Soft tissue structures are unremarkable. Gas pattern is nonspecific. Stool is noted throughout the colon. Pill fragments are noted over the abdomen. Catheters noted in stable position over the abdomen. Left hip replacement. Sclerosis and cystic changes right femoral head consistent with avascular necrosis. Loose bodies are noted over the right hip joint.  IMPRESSION: 1. No acute bony abnormality. 2. Avascular necrosis right hip. 3. Left hip replacement in good anatomic alignment.   Electronically Signed   By: Marcello Moores  Register   On: 09/10/2013 12:08   Dg Tibia/fibula Left  09/10/2013   CLINICAL DATA:  Fall.  Ankle pain.  Soft tissue swelling laterally.  EXAM: LEFT TIBIA AND FIBULA - 2 VIEW  COMPARISON:  Left knee films same date. Left ankle films 02/02/2007.  FINDINGS: Fracture of the left femoral condylar region suspected as noted on the knee film report.  No tibia or fibular fracture is detected. There are degenerative changes of the lateral malleolar and distal tibiotalar  region.  Spur at the level of the Achilles tendon insertion and plantar fashion origin.  IMPRESSION: Fracture of the left femoral condylar region suspected as noted on the knee film report.  No tibia or fibular fracture is detected. There are degenerative changes of the lateral malleolar and distal tibiotalar region.   Electronically Signed   By: Chauncey Cruel M.D.   On: 09/10/2013 11:14   Ct Knee Left Wo Contrast  09/10/2013   CLINICAL DATA:  Posterior knee pain after falling yesterday. Evaluate lateral femoral condylar fracture.  EXAM: CT OF THE LEFT KNEE WITHOUT CONTRAST  TECHNIQUE: Multidetector CT imaging of the left knee was performed according to the standard protocol. Multiplanar CT image reconstructions were also generated.  COMPARISON:  Radiographs same date.  FINDINGS: The bones appear diffusely demineralized with the trabecular prominence. As demonstrated radiographically, there is a mildly displaced fracture involving the posterolateral aspect of the lateral femoral condyle. This demonstrates up to 4 mm of articular surface offset laterally. The lateral metaphyseal cortex is also mildly displaced.  The medial femoral condyle is intact. There is a nondisplaced fracture involving the medial tibial plateau posteromedially. The lateral tibial plateau and proximal fibula are intact. The patella is intact.  There is a small joint effusion without definite layering intra-articular fat. There is mild soft tissue edema surrounding the knee.  IMPRESSION: 1. Mildly displaced intra-articular fracture involving the lateral femoral condyle posterolaterally. There is mild articular surface offset as described. 2. Nondisplaced fracture of the medial tibial plateau posteromedially. 3. Prominent osseous demineralization for age suggesting possible metabolic disorder or disuse. Correlate clinically.   Electronically Signed   By: Camie Patience M.D.   On: 09/10/2013 12:44   Dg Knee Complete 4 Views Left  09/10/2013    CLINICAL DATA:  Fall.  Pain.  EXAM: LEFT KNEE - COMPLETE 4+ VIEW  COMPARISON:  None.  FINDINGS: Fracture of the left femoral condyles which is unusual and I suspect acute. Slight displacement of fracture fragment. Small joint effusion. Given the unusual appearance, CT imaging may be considered to confirm that this is acute and better define the degree of displacement of the fracture fragment.  IMPRESSION: Fracture of the left femoral condyles which is unusual and I suspect acute. Slight displacement of fracture fragment. Small joint effusion. Given the unusual appearance, CT imaging may be considered to confirm that this is acute and better define the degree of displacement of the fracture fragment.   Electronically Signed   By: Chauncey Cruel M.D.   On: 09/10/2013 11:10     EKG Interpretation None      MDM   Final diagnoses:  Fracture of femoral condyle, left, closed, initial encounter  Fracture, tibial plateau, left, closed, with malunion, subsequent encounter  Essential hypertension  Leukocytosis    Pt is a 48yo male with c/o left leg pain after fall in his room yesterday while using his walker.  Pain is circumferential of left knee but FROM. neurovascularly in tact with soft compartments.   Pt does have hx of brain tumor in 1979 which has caused him to rely on a wheelchair and walker to ambulate, however, family states pt is normally able to place full weight on left leg.    Last meal was around 9am this morning.   Imaging: (1) mildly displaced intra-articular fracture involving lateral femoral condyle posterolaterally.  Mild articular surface offset.  (2) Nondisplaced fracture of medial tibial plateau posteromedially. (3) prominent osseous demineralization for age suggesting possible metabolic disorder or disuse.   Plain films Left hip: no acute bony abnormality.  Left hip replacement in good anatomic alignment.    Discussed pt with Dr. Mingo Amber who agrees to consult with orthopedics and  likely admit pt.  No evidence of compartment syndrome at this time.   Consulted with Dr. Percell Miller who agreed to operate on pt tomorrow morning, requested pt be transferred to [facility] where he is performing surgeries.  Pt will be admitted to medicine.   Consulted with Dr. Grandville Silos, hospitalist, to admit pt to med-surg at [facility].     Noland Fordyce, PA-C 09/11/13 [phone]

## 2013-09-10 NOTE — ED Notes (Signed)
Bed: WTR6 Expected date:  Expected time:  Means of arrival:  Comments: 

## 2013-09-10 NOTE — ED Notes (Signed)
Patient transported to X-ray 

## 2013-09-10 NOTE — ED Notes (Addendum)
[facility] here to provide safe transport of pt to [facility], pt's w/c sent home with family.  NAD upon leaving dept.

## 2013-09-10 NOTE — ED Notes (Signed)
Initial Contact - pt sitting up in w/c, A+Ox4, reports slipped on a napkin while walking with a walker yesterday and fell.  Pt denies hitting head or LOC.  Pt reports was able to ambulate after even though pain was present.  Pt reports pain worse this AM, 5/10 pain to L knee and lower leg currently, and pt reports "my whole leg is swollen".  Mild swelling noted, redness to L knee.  +csm/+pulses.  Skin otherwise PWD.  Pt denies other complaints.  Speaking full/clear sentences.  NAD.

## 2013-09-10 NOTE — ED Notes (Signed)
inpt md at bedside

## 2013-09-10 NOTE — ED Notes (Signed)
Pt to radiology.

## 2013-09-10 NOTE — ED Notes (Signed)
[facility] called to provide transport of pt to [facility].

## 2013-09-11 ENCOUNTER — Inpatient Hospital Stay (HOSPITAL_COMMUNITY): Payer: Medicare HMO

## 2013-09-11 ENCOUNTER — Encounter (HOSPITAL_COMMUNITY)
Admission: EM | Disposition: A | Discharge status: Skilled Nursing Facility | Payer: Self-pay | Source: Home / Self Care | Attending: Internal Medicine

## 2013-09-11 ENCOUNTER — Encounter (HOSPITAL_COMMUNITY): Payer: Self-pay | Admitting: Anesthesiology

## 2013-09-11 ENCOUNTER — Encounter (HOSPITAL_COMMUNITY): Payer: Medicare HMO | Admitting: Anesthesiology

## 2013-09-11 ENCOUNTER — Inpatient Hospital Stay (HOSPITAL_COMMUNITY): Payer: Medicare HMO | Admitting: Anesthesiology

## 2013-09-11 DIAGNOSIS — E785 Hyperlipidemia, unspecified: Secondary | ICD-10-CM

## 2013-09-11 HISTORY — PX: KNEE ARTHROSCOPY: SHX127

## 2013-09-11 HISTORY — PX: PERCUTANEOUS PINNING: SHX2209

## 2013-09-11 LAB — LIPID PANEL
CHOLESTEROL: 161 mg/dL (ref 0–200)
HDL: 63 mg/dL (ref >39)
LDL Cholesterol: 73 mg/dL (ref 0–99)
Total CHOL/HDL Ratio: 2.6 RATIO
Triglycerides: 123 mg/dL (ref <150)
VLDL: 25 mg/dL (ref 0–40)

## 2013-09-11 LAB — CBC
HCT: 39.2 % (ref 39.0–52.0)
Hemoglobin: 12.6 g/dL — ABNORMAL LOW (ref 13.0–17.0)
MCH: 31.7 pg (ref 26.0–34.0)
MCHC: 32.1 g/dL (ref 30.0–36.0)
MCV: 98.7 fL (ref 78.0–100.0)
Platelets: 179 10*3/uL (ref 150–400)
RBC: 3.97 MIL/uL — AB (ref 4.22–5.81)
RDW: 12.9 % (ref 11.5–15.5)
WBC: 8.4 10*3/uL (ref 4.0–10.5)

## 2013-09-11 LAB — BASIC METABOLIC PANEL
Anion gap: 12 (ref 5–15)
BUN: 9 mg/dL (ref 6–23)
CO2: 27 mEq/L (ref 19–32)
Calcium: 9.1 mg/dL (ref 8.4–10.5)
Chloride: 101 mEq/L (ref 96–112)
Creatinine, Ser: 0.74 mg/dL (ref 0.50–1.35)
GFR calc Af Amer: >90 mL/min (ref >90)
GFR calc non Af Amer: >90 mL/min (ref >90)
GLUCOSE: 106 mg/dL — AB (ref 70–99)
Potassium: 4 mEq/L (ref 3.7–5.3)
SODIUM: 140 meq/L (ref 137–147)

## 2013-09-11 LAB — PROTIME-INR
INR: 0.98 (ref 0.00–1.49)
Prothrombin Time: 13 seconds (ref 11.6–15.2)

## 2013-09-11 LAB — URINE CULTURE
CULTURE: NO GROWTH
Colony Count: NO GROWTH

## 2013-09-11 SURGERY — ARTHROSCOPY, KNEE
Anesthesia: General | Laterality: Left

## 2013-09-11 MED ORDER — PROPOFOL 10 MG/ML IV BOLUS
INTRAVENOUS | Status: AC
  Filled 2013-09-11: qty 20

## 2013-09-11 MED ORDER — DOCUSATE SODIUM 100 MG PO CAPS: 100.0000 mg | ORAL_CAPSULE | Freq: Two times a day (BID) | ORAL | Status: DC

## 2013-09-11 MED ORDER — BUPIVACAINE-EPINEPHRINE (PF) 0.5% -1:200000 IJ SOLN
INTRAMUSCULAR | Status: DC | PRN
  Administered 2013-09-11: 30 mL via PERINEURAL

## 2013-09-11 MED ORDER — MIDAZOLAM HCL 2 MG/2ML IJ SOLN
INTRAMUSCULAR | Status: AC
  Filled 2013-09-11: qty 2

## 2013-09-11 MED ORDER — CLINDAMYCIN PHOSPHATE 600 MG/50ML IV SOLN
600.0000 mg | Freq: Four times a day (QID) | INTRAVENOUS | Status: AC
  Administered 2013-09-11 – 2013-09-12 (×3): 600 mg via INTRAVENOUS
  Filled 2013-09-11 (×3): qty 50

## 2013-09-11 MED ORDER — FLUTICASONE PROPIONATE 50 MCG/ACT NA SUSP
2.0000 | Freq: Every day | NASAL | Status: DC
  Filled 2013-09-11: qty 16

## 2013-09-11 MED ORDER — OXYCODONE HCL 5 MG/5ML PO SOLN
5.0000 mg | Freq: Once | ORAL | Status: DC | PRN
Start: 2013-09-11 — End: 2013-09-11

## 2013-09-11 MED ORDER — SALINE SPRAY 0.65 % NA SOLN
1.0000 | NASAL | Status: DC | PRN
  Filled 2013-09-11: qty 44

## 2013-09-11 MED ORDER — OXYCODONE HCL 5 MG PO TABS: 10.0000 mg | ORAL_TABLET | ORAL | Status: DC | PRN

## 2013-09-11 MED ORDER — NEOSTIGMINE METHYLSULFATE 10 MG/10ML IV SOLN
INTRAVENOUS | Status: DC | PRN
  Administered 2013-09-11: 4 mg via INTRAVENOUS

## 2013-09-11 MED ORDER — HYDROMORPHONE HCL PF 1 MG/ML IJ SOLN
0.2500 mg | INTRAMUSCULAR | Status: DC | PRN
  Administered 2013-09-11 (×2): 0.25 mg via INTRAVENOUS

## 2013-09-11 MED ORDER — LIDOCAINE HCL (CARDIAC) 20 MG/ML IV SOLN
INTRAVENOUS | Status: DC | PRN
  Administered 2013-09-11: 60 mg via INTRAVENOUS
  Administered 2013-09-11: 40 mg via INTRAVENOUS

## 2013-09-11 MED ORDER — CLINDAMYCIN PHOSPHATE 600 MG/50ML IV SOLN
600.0000 mg | Freq: Once | INTRAVENOUS | Status: DC
  Filled 2013-09-11: qty 50

## 2013-09-11 MED ORDER — LACTATED RINGERS IV SOLN
INTRAVENOUS | Status: DC
  Administered 2013-09-11 – 2013-09-13 (×3): via INTRAVENOUS

## 2013-09-11 MED ORDER — ROCURONIUM BROMIDE 100 MG/10ML IV SOLN
INTRAVENOUS | Status: DC | PRN
  Administered 2013-09-11: 50 mg via INTRAVENOUS

## 2013-09-11 MED ORDER — OXYCODONE HCL 5 MG PO TABS: 5.0000 mg | ORAL_TABLET | Freq: Once | ORAL | Status: DC | PRN

## 2013-09-11 MED ORDER — FENTANYL CITRATE 0.05 MG/ML IJ SOLN
INTRAMUSCULAR | Status: AC
  Administered 2013-09-11: 25 ug
  Filled 2013-09-11: qty 2

## 2013-09-11 MED ORDER — ASPIRIN 81 MG PO CHEW
81.0000 mg | CHEWABLE_TABLET | Freq: Every day | ORAL | Status: DC
  Administered 2013-09-11 – 2013-09-13 (×3): 81 mg via ORAL
  Filled 2013-09-11 (×2): qty 1

## 2013-09-11 MED ORDER — CLINDAMYCIN PHOSPHATE 600 MG/50ML IV SOLN
INTRAVENOUS | Status: DC | PRN
  Administered 2013-09-11: 600 mg via INTRAVENOUS

## 2013-09-11 MED ORDER — ARTIFICIAL TEARS OP OINT
TOPICAL_OINTMENT | OPHTHALMIC | Status: DC | PRN
  Administered 2013-09-11: 1 via OPHTHALMIC

## 2013-09-11 MED ORDER — FENTANYL CITRATE 0.05 MG/ML IJ SOLN
INTRAMUSCULAR | Status: AC
  Filled 2013-09-11: qty 5

## 2013-09-11 MED ORDER — ONDANSETRON HCL 4 MG PO TABS: 4.0000 mg | ORAL_TABLET | Freq: Three times a day (TID) | ORAL | Status: DC | PRN

## 2013-09-11 MED ORDER — PROPOFOL 10 MG/ML IV BOLUS
INTRAVENOUS | Status: DC | PRN
  Administered 2013-09-11: 160 mg via INTRAVENOUS

## 2013-09-11 MED ORDER — PROMETHAZINE HCL 25 MG/ML IJ SOLN: 6.2500 mg | INTRAMUSCULAR | Status: DC | PRN

## 2013-09-11 MED ORDER — ONDANSETRON HCL 4 MG/2ML IJ SOLN
INTRAMUSCULAR | Status: DC | PRN
  Administered 2013-09-11: 4 mg via INTRAVENOUS

## 2013-09-11 MED ORDER — FENTANYL CITRATE 0.05 MG/ML IJ SOLN
INTRAMUSCULAR | Status: DC | PRN
  Administered 2013-09-11 (×5): 50 ug via INTRAVENOUS

## 2013-09-11 MED ORDER — ASPIRIN 81 MG PO TABS: 81.0000 mg | ORAL_TABLET | Freq: Every day | ORAL | Status: AC

## 2013-09-11 MED ORDER — SODIUM CHLORIDE 0.9 % IR SOLN
Status: DC | PRN
  Administered 2013-09-11 (×2): 3000 mL

## 2013-09-11 MED ORDER — LACTATED RINGERS IV SOLN
INTRAVENOUS | Status: DC | PRN
  Administered 2013-09-11: 12:00:00 via INTRAVENOUS

## 2013-09-11 MED ORDER — HYDROMORPHONE HCL PF 1 MG/ML IJ SOLN
INTRAMUSCULAR | Status: AC
  Filled 2013-09-11: qty 1

## 2013-09-11 MED ORDER — PROPOFOL 10 MG/ML IV BOLUS
INTRAVENOUS | Status: AC
Start: 2013-09-11 — End: 2013-09-11
  Filled 2013-09-11: qty 20

## 2013-09-11 MED ORDER — GLYCOPYRROLATE 0.2 MG/ML IJ SOLN
INTRAMUSCULAR | Status: DC | PRN
  Administered 2013-09-11: 0.6 mg via INTRAVENOUS

## 2013-09-11 MED ORDER — MIDAZOLAM HCL 2 MG/2ML IJ SOLN
INTRAMUSCULAR | Status: AC
  Administered 2013-09-11: 1 mg
  Filled 2013-09-11: qty 2

## 2013-09-11 SURGICAL SUPPLY — 60 items
BANDAGE ELASTIC 6 VELCRO ST LF (GAUZE/BANDAGES/DRESSINGS) ×2 IMPLANT
BANDAGE ESMARK 6X9 LF (GAUZE/BANDAGES/DRESSINGS) IMPLANT
BIT DRILL CANNULATED 2.7 (BIT) IMPLANT
BLADE CUTTER GATOR 3.5 (BLADE) ×2 IMPLANT
BLADE SURG 11 STRL SS (BLADE) ×2 IMPLANT
BLADE SURG ROTATE 9660 (MISCELLANEOUS) ×2 IMPLANT
BNDG CMPR 9X6 STRL LF SNTH (GAUZE/BANDAGES/DRESSINGS)
BNDG ESMARK 6X9 LF (GAUZE/BANDAGES/DRESSINGS)
BNDG GAUZE ELAST 4 BULKY (GAUZE/BANDAGES/DRESSINGS) ×2 IMPLANT
BOOTCOVER CLEANROOM LRG (PROTECTIVE WEAR) ×2 IMPLANT
COVER SURGICAL LIGHT HANDLE (MISCELLANEOUS) ×1 IMPLANT
CUFF TOURNIQUET SINGLE 34IN LL (TOURNIQUET CUFF) ×2 IMPLANT
CUTTER MENISCUS 3.5MM 6/BX (BLADE) ×1 IMPLANT
DRAPE ARTHROSCOPY W/POUCH 114 (DRAPES) ×3 IMPLANT
DRAPE C-ARMOR (DRAPES) ×2 IMPLANT
DRAPE U-SHAPE 47X51 STRL (DRAPES) ×3 IMPLANT
DRILL CANNULATED 2.7 (BIT) ×3
DRSG ADAPTIC 3X8 NADH LF (GAUZE/BANDAGES/DRESSINGS) ×2 IMPLANT
DRSG PAD ABDOMINAL 8X10 ST (GAUZE/BANDAGES/DRESSINGS) ×2 IMPLANT
FACESHIELD WRAPAROUND (MASK) IMPLANT
FACESHIELD WRAPAROUND OR TEAM (MASK) ×1 IMPLANT
GAUZE SPONGE 4X4 12PLY STRL (GAUZE/BANDAGES/DRESSINGS) ×2 IMPLANT
GAUZE XEROFORM 1X8 LF (GAUZE/BANDAGES/DRESSINGS) ×1 IMPLANT
GLOVE BIO SURGEON STRL SZ8 (GLOVE) ×2 IMPLANT
GLOVE BIOGEL PI IND STRL 8 (GLOVE) ×1 IMPLANT
GLOVE BIOGEL PI INDICATOR 8 (GLOVE) ×2
GLOVE BIOGEL PI ORTHO PRO SZ8 (GLOVE) ×2
GLOVE ORTHO TXT STRL SZ7.5 (GLOVE) ×3 IMPLANT
GLOVE PI ORTHO PRO STRL SZ8 (GLOVE) IMPLANT
GOWN STRL REUS W/ TWL LRG LVL3 (GOWN DISPOSABLE) ×2 IMPLANT
GOWN STRL REUS W/ TWL XL LVL3 (GOWN DISPOSABLE) ×1 IMPLANT
GOWN STRL REUS W/TWL 2XL LVL3 (GOWN DISPOSABLE) ×1 IMPLANT
GOWN STRL REUS W/TWL LRG LVL3 (GOWN DISPOSABLE) ×6
GOWN STRL REUS W/TWL XL LVL3 (GOWN DISPOSABLE) ×6
K-WIRE ORTHOPEDIC 2X15CML THR (WIRE) ×6
KIT ROOM TURNOVER OR (KITS) ×3 IMPLANT
KWIRE ORTHOPEDIC 2X15CML THR (WIRE) IMPLANT
MANIFOLD NEPTUNE II (INSTRUMENTS) ×2 IMPLANT
NDL SPNL 18GX3.5 QUINCKE PK (NEEDLE) IMPLANT
NEEDLE 22X1 1/2 (OR ONLY) (NEEDLE) ×5 IMPLANT
NEEDLE SPNL 18GX3.5 QUINCKE PK (NEEDLE) ×3 IMPLANT
NS IRRIG 1000ML POUR BTL (IV SOLUTION) IMPLANT
PACK ARTHROSCOPY DSU (CUSTOM PROCEDURE TRAY) ×3 IMPLANT
PAD ABD 8X10 STRL (GAUZE/BANDAGES/DRESSINGS) ×2 IMPLANT
PAD ARMBOARD 7.5X6 YLW CONV (MISCELLANEOUS) ×4 IMPLANT
PAD CAST 4YDX4 CTTN HI CHSV (CAST SUPPLIES) IMPLANT
PADDING CAST COTTON 4X4 STRL (CAST SUPPLIES) ×3
PADDING CAST COTTON 6X4 STRL (CAST SUPPLIES) IMPLANT
SCREW CANNULATED 5.0X44MM (Screw) ×2 IMPLANT
SET ARTHROSCOPY TUBING (MISCELLANEOUS) ×3
SET ARTHROSCOPY TUBING LN (MISCELLANEOUS) ×1 IMPLANT
SPONGE GAUZE 4X4 12PLY STER LF (GAUZE/BANDAGES/DRESSINGS) ×2 IMPLANT
SPONGE LAP 4X18 X RAY DECT (DISPOSABLE) ×3 IMPLANT
SUT ETHILON 2 0 FS 18 (SUTURE) ×2 IMPLANT
TOWEL OR 17X24 6PK STRL BLUE (TOWEL DISPOSABLE) ×1 IMPLANT
TOWEL OR 17X26 10 PK STRL BLUE (TOWEL DISPOSABLE) ×3 IMPLANT
TUBE CONNECTING 12'X1/4 (SUCTIONS)
TUBE CONNECTING 12X1/4 (SUCTIONS) ×1 IMPLANT
WASHER FOR SCREW 5.0 (Washer) ×2 IMPLANT
WATER STERILE IRR 1000ML POUR (IV SOLUTION) ×3 IMPLANT

## 2013-09-11 NOTE — Progress Notes (Signed)
Report called to Sharyn Lull , RN in short stay. Patient having ORIF of L femur fracture today. Consents signed, PCR swab performed, CHG bath done.

## 2013-09-11 NOTE — Anesthesia Procedure Notes (Addendum)
Anesthesia Regional Block:  Femoral nerve block  Pre-Anesthetic Checklist: ,, timeout performed, Correct Patient, Correct Site, Correct Laterality, Correct Procedure, Correct Position, site marked, Risks and benefits discussed, at surgeon's request and post-op pain management  Laterality: Left and Upper  Prep: chloraprep       Needles:  Injection technique: Single-shot  Needle Type: Echogenic Needle     Needle Length: 9cm 9 cm Needle Gauge: 22 and 22 G  Needle insertion depth: 5 cm   Additional Needles:  Procedures: ultrasound guided (picture in chart) and nerve stimulator Femoral nerve block  Nerve Stimulator or Paresthesia:  Response: Twitch elicited, 0.8 mA,   Additional Responses:   Narrative:  Start time: 09/11/2013 10:15 AM End time: 09/11/2013 10:30 AM Injection made incrementally with aspirations every 5 mL.  Performed by: Personally  Anesthesiologist: Bartolo Darter, MD  Additional Notes: BP cuff, EKG monitors applied. Sedation begun. Femoral artery palpated for location of nerve. After nerve location anesthetic injected incrementally, slowly , and after neg aspirations. Tolerated well.   Procedure Name: Intubation Date/Time: 09/11/2013 12:28 PM Performed by: Scheryl Darter Pre-anesthesia Checklist: Patient identified, Emergency Drugs available, Suction available, Patient being monitored and Timeout performed Patient Re-evaluated:Patient Re-evaluated prior to inductionOxygen Delivery Method: Circle system utilized Preoxygenation: Pre-oxygenation with 100% oxygen Intubation Type: IV induction Ventilation: Mask ventilation without difficulty Laryngoscope Size: Miller and 4 Grade View: Grade II Tube type: Oral Tube size: 8.0 mm Number of attempts: 1 Airway Equipment and Method: Stylet Placement Confirmation: ETT inserted through vocal cords under direct vision,  positive ETCO2 and breath sounds checked- equal and bilateral Secured at: 22 cm Tube secured with:  Tape Dental Injury: Teeth and Oropharynx as per pre-operative assessment

## 2013-09-11 NOTE — Anesthesia Postprocedure Evaluation (Signed)
  Anesthesia Post-op Note  Patient: Herbert Williams  Procedure(s) Performed: Procedure(s): LEFT ARTHROSCOPY WITH FIXATION OF FEMUR FX. (Left) PERCUTANEOUS PINNING EXTREMITY (Left)  Patient Location: PACU  Anesthesia Type:General  Level of Consciousness: awake, alert  and oriented  Airway and Oxygen Therapy: Patient Spontanous Breathing and Patient connected to nasal cannula oxygen  Post-op Pain: mild  Post-op Assessment: Post-op Vital signs reviewed, Patient's Cardiovascular Status Stable, Respiratory Function Stable, Patent Airway and Pain level controlled  Post-op Vital Signs: stable  Last Vitals:  Filed Vitals:   09/11/13 1500  BP: 140/77  Pulse: 77  Temp: 36.4 C  Resp: 16    Complications: No apparent anesthesia complications

## 2013-09-11 NOTE — Transfer of Care (Signed)
Immediate Anesthesia Transfer of Care Note  Patient: Herbert Williams  Procedure(s) Performed: Procedure(s): LEFT ARTHROSCOPY WITH FIXATION OF FEMUR FX. (Left) PERCUTANEOUS PINNING EXTREMITY (Left)  Patient Location: PACU  Anesthesia Type:General  Level of Consciousness: awake, alert , oriented and sedated  Airway & Oxygen Therapy: Patient Spontanous Breathing and Patient connected to nasal cannula oxygen  Post-op Assessment: Report given to PACU RN, Post -op Vital signs reviewed and stable and Patient moving all extremities  Post vital signs: Reviewed and stable  Complications: No apparent anesthesia complications

## 2013-09-11 NOTE — Progress Notes (Signed)
PT Cancellation Note  Patient Details Name: Herbert Williams MRN: [id number] DOB: 1965/02/18   Cancelled Treatment:    Reason Eval/Treat Not Completed:  (Pt off unit getting ORIF).  Will check back on pt tomorrow.    Denice Bors 09/11/2013, 1:02 PM

## 2013-09-11 NOTE — Progress Notes (Signed)
OT Cancellation Note  Patient Details Name: Herbert Williams MRN: [id number] DOB: 08/31/1965   Cancelled Treatment:    Reason Eval/Treat Not Completed: Patient at procedure or test/ unavailable. OT to reattempt.  Hortencia Pilar 09/11/2013, 11:36 AM

## 2013-09-11 NOTE — Anesthesia Preprocedure Evaluation (Signed)
Anesthesia Evaluation  Patient identified by MRN, date of birth, ID band Patient awake    Reviewed: Allergy & Precautions, H&P , NPO status , Patient's Chart, lab work & pertinent test results  History of Anesthesia Complications Negative for: history of anesthetic complications  Airway Mallampati: I  Neck ROM: Full    Dental  (+) Teeth Intact   Pulmonary Current Smoker,  breath sounds clear to auscultation        Cardiovascular hypertension, Rate:Normal     Neuro/Psych Post tumor resection 1978    GI/Hepatic GERD-  ,  Endo/Other    Renal/GU      Musculoskeletal   Abdominal   Peds  Hematology   Anesthesia Other Findings   Reproductive/Obstetrics                           Anesthesia Physical Anesthesia Plan  ASA: II  Anesthesia Plan: General   Post-op Pain Management:    Induction: Intravenous  Airway Management Planned:   Additional Equipment:   Intra-op Plan:   Post-operative Plan: Extubation in OR  Informed Consent: I have reviewed the patients History and Physical, chart, labs and discussed the procedure including the risks, benefits and alternatives for the proposed anesthesia with the patient or authorized representative who has indicated his/her understanding and acceptance.   Dental advisory given  Plan Discussed with: CRNA and Surgeon  Anesthesia Plan Comments:         Anesthesia Quick Evaluation

## 2013-09-11 NOTE — Progress Notes (Signed)
[facility] Progress Note   Herbert Williams ENI:[id number] DOB: 07/02/1965 DOA: 09/10/2013 PCP: Mathews Argyle, MD  Brief narrative: Herbert Williams is a 48 y.o. male  Admitted for right knee surgery and was asked for medical team to admit.    Subjective: Has no complaints other than pain after surgery.   Assessment/Plan: Principal Problem:   Fracture of femoral condyle, left, closed - per ortho team  Active Problems:  #1 left tibial plateau fracture/left fracture of the femoral condyle  Per ortho  #2 gastroesophageal reflux disease  PPI.   #3 hypertension  Continue home regimen of atenolol.   #4 leukocytosis  Improved - no acute infection  #5 hyperlipidemia  fasting lipid panel normal-  Continue [facility].   #6 history of choroid plexus papilloma status post resection in 1978  Neurological deficits are stable (left facial droop, poor balance)- needs walker to walk at baseline  #7 Hx of ETOH use  Per family patient drinks 3 large cans of beer daily therefore, Placed on Ativan withdrawal CIWA protocol - no signs of withdrawal as of yet- follow  #8 Tobacco use  Nicotine patch. Tobacco cessation       Code Status: full code Family Communication: none Disposition Plan: per PT eval  Consultants: ortho  Procedures: 8/4- LEFT ARTHROSCOPY WITH FIXATION OF FEMUR FX., PERCUTANEOUS PINNING EXTREMITY   Antibiotics: Anti-infectives   Start     Dose/Rate Route Frequency Ordered Stop   09/11/13 1515  clindamycin (CLEOCIN) IVPB 600 mg     600 mg 100 mL/hr over 30 Minutes Intravenous Every 6 hours 09/11/13 1454 09/12/13 0914   09/11/13 1300  clindamycin (CLEOCIN) IVPB 600 mg  Status:  Discontinued     600 mg 100 mL/hr over 30 Minutes Intravenous  Once 09/11/13 1246 09/11/13 1454       DVT prophylaxis: Lovenox  Objective: Filed Weights   09/10/13 1900  Weight: 78.926 kg (174 lb)    Intake/Output Summary (Last 24 hours) at 09/11/13 1736 Last data  filed at 09/11/13 1703  Gross per 24 hour  Intake    640 ml  Output    525 ml  Net    115 ml     Vitals Filed Vitals:   09/11/13 1430 09/11/13 1445 09/11/13 1454 09/11/13 1500  BP: 152/88 143/86 158/91 140/77  Pulse: 71 59 80 77  Temp:   98.3 F (36.8 C) 97.5 F (36.4 C)  TempSrc:    Oral  Resp: 16 19 13 16   Height:      Weight:      SpO2: 100% 100% 100% 100%    Exam: General: No acute respiratory distress Lungs: Clear to auscultation bilaterally without wheezes or crackles Cardiovascular: Regular rate and rhythm without murmur gallop or rub normal S1 and S2 Abdomen: Nontender, nondistended, soft, bowel sounds positive, no rebound, no ascites, no appreciable mass Extremities: No significant cyanosis, clubbing, or edema bilateral lower extremities- left leg in immobilizer  Data Reviewed: Basic Metabolic Panel:  Recent Labs Lab 09/10/13 1316 09/11/13 0549  NA 139 140  K 4.3 4.0  CL 99 101  CO2 27 27  GLUCOSE 110* 106*  BUN 12 9  CREATININE 0.72 0.74  CALCIUM 10.1 9.1   Liver Function Tests: No results found for this basename: AST, ALT, ALKPHOS, BILITOT, PROT, ALBUMIN,  in the last 168 hours No results found for this basename: LIPASE, AMYLASE,  in the last 168 hours No results found for this basename: AMMONIA,  in the  last 168 hours CBC:  Recent Labs Lab 09/10/13 1316 09/11/13 0549  WBC 14.1* 8.4  HGB 14.5 12.6*  HCT 43.1 39.2  MCV 96.6 98.7  PLT 237 179   Cardiac Enzymes: No results found for this basename: CKTOTAL, CKMB, CKMBINDEX, TROPONINI,  in the last 168 hours BNP (last 3 results) No results found for this basename: PROBNP,  in the last 8760 hours CBG: No results found for this basename: GLUCAP,  in the last 168 hours  Recent Results (from the past 240 hour(s))  SURGICAL PCR SCREEN     Status: None   Collection Time    09/10/13  5:27 PM      Result Value Ref Range Status   MRSA, PCR NEGATIVE  NEGATIVE Final   Staphylococcus aureus NEGATIVE   NEGATIVE Final   Comment:            The Xpert SA Assay (FDA     approved for NASAL specimens     in patients over 21 years of age),     is one component of     a comprehensive surveillance     program.  Test performance has     been validated by [employer] for patients greater     than or equal to 67 year old.     It is not intended     to diagnose infection nor to     guide or monitor treatment.     Studies:  Recent x-ray studies have been reviewed in detail by the Attending Physician  Scheduled Meds:  Scheduled Meds: . aspirin  81 mg Oral Daily  . atenolol  50 mg Oral Daily  . clindamycin (CLEOCIN) IV  600 mg Intravenous Q6H  . docusate sodium  100 mg Oral BID  . enoxaparin (LOVENOX) injection  40 mg Subcutaneous Q24H  . fenofibrate  160 mg Oral Daily  . folic acid  1 mg Oral Daily  . HYDROmorphone      . LORazepam  0-4 mg Intravenous Q6H   Followed by  . [START ON 09/12/2013] LORazepam  0-4 mg Intravenous Q12H  . multivitamin with minerals  1 tablet Oral Daily  . nicotine  14 mg Transdermal Daily  . pantoprazole  40 mg Oral Daily  . thiamine  100 mg Oral Daily   Or  . thiamine  100 mg Intravenous Daily   Continuous Infusions: . sodium chloride 75 mL/hr at 09/11/13 0700  . lactated ringers 75 mL/hr at 09/11/13 1551    Time spent on care of this patient: 30 min   [place], MD 09/11/2013, 5:36 PM  LOS: 1 day   [facility] Office  [phone] Pager - Text Page per [email or web address]  If 7PM-7AM, please contact night-coverage [email or web address]

## 2013-09-11 NOTE — Discharge Instructions (Signed)
Remove dressings in 5 days and ok to shower then. No soaking  No weight on leg  Take ASA 81mg  for 30 days  Knee immobilizer full time.

## 2013-09-11 NOTE — Op Note (Signed)
09/10/2013 - 09/11/2013  1:31 PM  PATIENT:  Herbert Williams    PRE-OPERATIVE DIAGNOSIS:  LEFT FEMORAL CONDYLE FRACTURE  POST-OPERATIVE DIAGNOSIS:  Same  PROCEDURE:  LEFT ARTHROSCOPY WITH FIXATION OF FEMUR FX., PERCUTANEOUS PINNING EXTREMITY  SURGEON:  Edmonia Lynch, D, MD  ASSISTANT: Joya Gaskins  ANESTHESIA:   General  PREOPERATIVE INDICATIONS:  [name] is a  48 y.o. male with a diagnosis of LEFT FEMORAL CONDYLE FRACTURE who failed conservative measures and elected for surgical management.    The risks benefits and alternatives were discussed with the patient preoperatively including but not limited to the risks of infection, bleeding, nerve injury, cardiopulmonary complications, the need for revision surgery, among others, and the patient was willing to proceed.  OPERATIVE IMPLANTS: 5.0 canulated screw  OPERATIVE FINDINGS: appropriate reduction  BLOOD LOSS: min  COMPLICATIONS: none  TOURNIQUET TIME: none  OPERATIVE PROCEDURE:  Patient was identified in the preoperative holding area and site was marked by me He was transported to the operating theater and placed on the table in supine position taking care to pad all bony prominences. After a preincinduction time out anesthesia was induced. The left lower extremity was prepped and draped in normal sterile fashion and a pre-incision timeout was performed. He received ancef for preoperative antibiotics.   I made a inferior medial and lateral arthroscopic portals and inserted the arthroscope. The remainder of the knee was without pathology the medial tibial plateau had no visible car chondral disruption.  On the lateral joint line there is an obvious fracture at the weightbearing surface is engaged in only a few degrees of flexion. There is a significant step off. I debrided out the fracture with a shaver. I then inserted a K wire from back to front and lateral to central and obtain control of the Hoffa fragment with a K wire  and reduced it into place and then advance his K wire across into the stable portion of bone. At this point I took multiple fluoroscopic views to confirm that the K wire did not enter into the notch there was very close to the anterior aspect of the notch but I then inserted the scope was not able to visualize in the notch side except in the position as we had excellent reduction of the fracture.  Again I examined the fracture arthroscopically and a reduction was within a millimeter. At this point I overdrilled the cortex and placed a partially-threaded 50 cannulated screw with a washer it had an excellent bite and I visualized the entire articular surface the fracture and had an excellent reduction. I then not removed all arthroscopic equipment expressed the fluid and closed the portals with a nylon stitch as well as the insertion screw of the incision for the screw insertion. I placed a sterile dressing his taken the PACU in stable condition.  POST OPERATIVE PLAN: He'll be nonweightbearing he'll stay in a knee immobilizer until followup with an aspirin for DVT prophylaxis.

## 2013-09-12 NOTE — Progress Notes (Signed)
[facility] PROGRESS NOTE  Herbert Williams VHQ:[id number] DOB: 02-05-1966 DOA: 09/10/2013 PCP: Mathews Argyle, MD  Assessment/Plan: Principal Problem:   Fracture of femoral condyle, left, closed Active Problems:   Fracture, tibial plateau   Choroid plexus papilloma: s/p resection 1978   GERD (gastroesophageal reflux disease)   Hyperlipemia   HTN (hypertension)   Leukocytosis   Tibial plateau fracture, left    Brief narrative:  Herbert Williams is a 48 y.o. male Admitted for right knee surgery and was asked for medical team to admit.  Subjective:  Has no complaints other than pain after surgery.  Assessment/Plan:  Principal Problem:  Fracture of femoral condyle, left, closed  - per ortho team  Active Problems:  #1 left tibial plateau fracture/left fracture of the femoral condyle  Per ortho  CIR versus SNF versus home health Continue knee immobilizer Discussed plan of care with the mother and basically told her that the social worker and case manager finally have to decide on his disposition, medically and surgically stable for discharge #2 gastroesophageal reflux disease  PPI.  #3 hypertension  Continue home regimen of atenolol.  #4 leukocytosis  Improved - no acute infection  #5 hyperlipidemia  fasting lipid panel normal- Continue TriCor.  #6 history of choroid plexus papilloma status post resection in 1978  Neurological deficits are stable (left facial droop, poor balance)- needs walker to walk at baseline  #7 Hx of ETOH use  Per family patient drinks 3 large cans of beer daily therefore, Placed on Ativan withdrawal CIWA protocol  - no signs of withdrawal as of yet- follow  #8 Tobacco use  Nicotine patch. Tobacco cessation    Code Status: full code  Family Communication: none  Disposition Plan: per PT eval  Consultants:  ortho  Procedures:  8/4- LEFT ARTHROSCOPY WITH FIXATION OF FEMUR FX., PERCUTANEOUS PINNING EXTREMITY  Antibiotics:   Anti-infectives    Start    Dose/Rate  Route  Frequency  Ordered  Stop    09/11/13 1515   clindamycin (CLEOCIN) IVPB 600 mg  600 mg  100 mL/hr over 30 Minutes  Intravenous  Every 6 hours  09/11/13 1454  09/12/13 0914    09/11/13 1300   clindamycin (CLEOCIN) IVPB 600 mg Status: Discontinued  600 mg  100 mL/hr over 30 Minutes  Intravenous  Once  09/11/13 1246  09/11/13 1454      DVT prophylaxis:  Lovenox     HPI/Subjective: Patient reports pain as mild to moderate. Patient sitting up in bed in no acute distress      Objective: Filed Vitals:   09/11/13 1454 09/11/13 1500 09/11/13 2100 09/12/13 0521  BP: 158/91 140/77 132/75 122/79  Pulse: 80 77 81 80  Temp: 98.3 F (36.8 C) 97.5 F (36.4 C) 97.9 F (36.6 C) 98.2 F (36.8 C)  TempSrc:  Oral    Resp: 13 16 16 14   Height:      Weight:      SpO2: 100% 100% 100% 96%    Intake/Output Summary (Last 24 hours) at 09/12/13 1054 Last data filed at 09/12/13 0830  Gross per 24 hour  Intake    880 ml  Output   1475 ml  Net   -595 ml    Exam:  General: alert & oriented x 3 In NAD  Cardiovascular: RRR, nl S1 s2  Respiratory: Decreased breath sounds at the bases, scattered rhonchi, no crackles  Abdomen: soft +BS NT/ND, no masses palpable  Extremities: Dorsiflexion/Plantar flexion intact  Incision: dressing C/D/I and no drainage        Data Reviewed: Basic Metabolic Panel:  Recent Labs Lab 09/10/13 1316 09/11/13 0549  NA 139 140  K 4.3 4.0  CL 99 101  CO2 27 27  GLUCOSE 110* 106*  BUN 12 9  CREATININE 0.72 0.74  CALCIUM 10.1 9.1    Liver Function Tests: No results found for this basename: AST, ALT, ALKPHOS, BILITOT, PROT, ALBUMIN,  in the last 168 hours No results found for this basename: LIPASE, AMYLASE,  in the last 168 hours No results found for this basename: AMMONIA,  in the last 168 hours  CBC:  Recent Labs Lab 09/10/13 1316 09/11/13 0549  WBC 14.1* 8.4  HGB 14.5 12.6*  HCT 43.1 39.2  MCV  96.6 98.7  PLT 237 179    Cardiac Enzymes: No results found for this basename: CKTOTAL, CKMB, CKMBINDEX, TROPONINI,  in the last 168 hours BNP (last 3 results) No results found for this basename: PROBNP,  in the last 8760 hours   CBG: No results found for this basename: GLUCAP,  in the last 168 hours  Recent Results (from the past 240 hour(s))  URINE CULTURE     Status: None   Collection Time    09/10/13  2:54 PM      Result Value Ref Range Status   Specimen Description URINE, CLEAN CATCH   Final   Special Requests NONE   Final   Culture  Setup Time     Final   Value: 09/10/2013 20:02     Performed at [place]     Final   Value: NO GROWTH     Performed at [facility]   Culture     Final   Value: NO GROWTH     Performed at [facility]   Report Status 09/11/2013 FINAL   Final  SURGICAL PCR SCREEN     Status: None   Collection Time    09/10/13  5:27 PM      Result Value Ref Range Status   MRSA, PCR NEGATIVE  NEGATIVE Final   Staphylococcus aureus NEGATIVE  NEGATIVE Final   Comment:            The Xpert SA Assay (FDA     approved for NASAL specimens     in patients over 10 years of age),     is one component of     a comprehensive surveillance     program.  Test performance has     been validated by [employer] for patients greater     than or equal to 41 year old.     It is not intended     to diagnose infection nor to     guide or monitor treatment.     Studies: Dg Hip Complete Left  09/10/2013   CLINICAL DATA:  Fall.  EXAM: LEFT HIP - COMPLETE 2+ VIEW  COMPARISON:  04/24/2007.  FINDINGS: Soft tissue structures are unremarkable. Gas pattern is nonspecific. Stool is noted throughout the colon. Pill fragments are noted over the abdomen. Catheters noted in stable position over the abdomen. Left hip replacement. Sclerosis and cystic changes right femoral head consistent with avascular necrosis. Loose bodies are noted over  the right hip joint.  IMPRESSION: 1. No acute bony abnormality. 2. Avascular necrosis right hip. 3. Left hip replacement in good anatomic alignment.   Electronically Signed  By: [place]   On: 09/10/2013 12:08   Dg Tibia/fibula Left  09/10/2013   CLINICAL DATA:  Fall.  Ankle pain.  Soft tissue swelling laterally.  EXAM: LEFT TIBIA AND FIBULA - 2 VIEW  COMPARISON:  Left knee films same date. Left ankle films 02/02/2007.  FINDINGS: Fracture of the left femoral condylar region suspected as noted on the knee film report.  No tibia or fibular fracture is detected. There are degenerative changes of the lateral malleolar and distal tibiotalar region.  Spur at the level of the Achilles tendon insertion and plantar fashion origin.  IMPRESSION: Fracture of the left femoral condylar region suspected as noted on the knee film report.  No tibia or fibular fracture is detected. There are degenerative changes of the lateral malleolar and distal tibiotalar region.   Electronically Signed   By: Chauncey Cruel M.D.   On: 09/10/2013 11:14   Ct Knee Left Wo Contrast  09/10/2013   CLINICAL DATA:  Posterior knee pain after falling yesterday. Evaluate lateral femoral condylar fracture.  EXAM: CT OF THE LEFT KNEE WITHOUT CONTRAST  TECHNIQUE: Multidetector CT imaging of the left knee was performed according to the standard protocol. Multiplanar CT image reconstructions were also generated.  COMPARISON:  Radiographs same date.  FINDINGS: The bones appear diffusely demineralized with the trabecular prominence. As demonstrated radiographically, there is a mildly displaced fracture involving the posterolateral aspect of the lateral femoral condyle. This demonstrates up to 4 mm of articular surface offset laterally. The lateral metaphyseal cortex is also mildly displaced.  The medial femoral condyle is intact. There is a nondisplaced fracture involving the medial tibial plateau posteromedially. The lateral tibial plateau and proximal  fibula are intact. The patella is intact.  There is a small joint effusion without definite layering intra-articular fat. There is mild soft tissue edema surrounding the knee.  IMPRESSION: 1. Mildly displaced intra-articular fracture involving the lateral femoral condyle posterolaterally. There is mild articular surface offset as described. 2. Nondisplaced fracture of the medial tibial plateau posteromedially. 3. Prominent osseous demineralization for age suggesting possible metabolic disorder or disuse. Correlate clinically.   Electronically Signed   By: Camie Patience M.D.   On: 09/10/2013 12:44   Dg Knee Complete 4 Views Left  09/10/2013   CLINICAL DATA:  Fall.  Pain.  EXAM: LEFT KNEE - COMPLETE 4+ VIEW  COMPARISON:  None.  FINDINGS: Fracture of the left femoral condyles which is unusual and I suspect acute. Slight displacement of fracture fragment. Small joint effusion. Given the unusual appearance, CT imaging may be considered to confirm that this is acute and better define the degree of displacement of the fracture fragment.  IMPRESSION: Fracture of the left femoral condyles which is unusual and I suspect acute. Slight displacement of fracture fragment. Small joint effusion. Given the unusual appearance, CT imaging may be considered to confirm that this is acute and better define the degree of displacement of the fracture fragment.   Electronically Signed   By: Chauncey Cruel M.D.   On: 09/10/2013 11:10   Dg Knee Left Port  09/11/2013   CLINICAL DATA:  Distal femur fracture.  EXAM: PORTABLE LEFT KNEE - 1-2 VIEW  COMPARISON:  CT scan dated 09/10/2013  FINDINGS: The patient has undergone open reduction and internal fixation of the fracture of the lateral femoral condyle. Alignment and position of the fracture fragments is anatomic. There is a single fixation screw in place. Osteopenia. Air and fluid in the joint, as expected.  IMPRESSION: Open reduction and internal fixation of lateral femoral condyle fracture.    Electronically Signed   By: Rozetta Nunnery M.D.   On: 09/11/2013 15:05   Dg C-arm 1-60 Min  09/11/2013   CLINICAL DATA:  Femur fracture ORIF.  EXAM: LEFT KNEE - 3 VIEW; DG C-ARM 61-120 MIN  COMPARISON:  CT, 09/10/2013  FINDINGS: A screw has been placed from lateral to medial reducing the lateral femoral condylar fracture into near anatomic alignment. The orthopedic hardware is well-seated. No evidence of an operative complication.  IMPRESSION: ORIF of a fracture of the left lateral femoral condyle.   Electronically Signed   By: Lajean Manes M.D.   On: 09/11/2013 16:08   Dg Knee 2 Views Left  09/11/2013   CLINICAL DATA:  Femur fracture ORIF.  EXAM: LEFT KNEE - 3 VIEW; DG C-ARM 61-120 MIN  COMPARISON:  CT, 09/10/2013  FINDINGS: A screw has been placed from lateral to medial reducing the lateral femoral condylar fracture into near anatomic alignment. The orthopedic hardware is well-seated. No evidence of an operative complication.  IMPRESSION: ORIF of a fracture of the left lateral femoral condyle.   Electronically Signed   By: Lajean Manes M.D.   On: 09/11/2013 16:08    Scheduled Meds: . aspirin  81 mg Oral Daily  . atenolol  50 mg Oral Daily  . docusate sodium  100 mg Oral BID  . enoxaparin (LOVENOX) injection  40 mg Subcutaneous Q24H  . fenofibrate  160 mg Oral Daily  . folic acid  1 mg Oral Daily  . LORazepam  0-4 mg Intravenous Q6H   Followed by  . LORazepam  0-4 mg Intravenous Q12H  . multivitamin with minerals  1 tablet Oral Daily  . nicotine  14 mg Transdermal Daily  . pantoprazole  40 mg Oral Daily  . thiamine  100 mg Oral Daily   Or  . thiamine  100 mg Intravenous Daily   Continuous Infusions: . sodium chloride 75 mL/hr at 09/11/13 0700  . lactated ringers 50 mL/hr at 09/12/13 6503    Principal Problem:   Fracture of femoral condyle, left, closed Active Problems:   Fracture, tibial plateau   Choroid plexus papilloma: s/p resection 1978   GERD (gastroesophageal reflux  disease)   Hyperlipemia   HTN (hypertension)   Leukocytosis   Tibial plateau fracture, left    Time spent: 40 minutes   [facility] Pager [phone]. If 8PM-8AM, please contact night-coverage at [email or web address], password [facility] 09/12/2013, 10:54 AM  LOS: 2 days

## 2013-09-12 NOTE — Progress Notes (Signed)
Received prescreen request for inpatient rehab from PT. I have reviewed pt's case and made note that pt has [employer]. Pt's current diagnosis is s/p L tib plateau fx and L fem fracture repair. I spoke with [place] reviewer and she stated that [facility] would not give authorization for inpatient rehab based on his current diagnosis.  We would recommend that skilled nursing or home with home health be pursued. If team would like [place] to pursue a formal rehab consult, please contact me. However, [employer] would likely not give authorization for inpatient rehab.  Please call me with any questions. Thanks.  Nanetta Batty, PT Rehabilitation Admissions Coordinator [phone]

## 2013-09-12 NOTE — Clinical Social Work Note (Signed)
Clinical Social Work Department BRIEF PSYCHOSOCIAL ASSESSMENT 09/12/2013  Patient:  Herbert Williams, Herbert Williams     Account Number:  [id number]     Admit date:  09/10/2013  Clinical Social Worker:  Wylene Men  Date/Time:  09/12/2013 11:46 AM  Referred by:  Physician  Date Referred:  09/12/2013 Referred for  SNF Placement  Psychosocial assessment   Other Referral:   none   Interview type:  Other - See comment Other interview type:   pt and pt mother, Herbert Williams    PSYCHOSOCIAL DATA Living Status:  PARENTS Admitted from facility:   Level of care:   Primary support name:  Herbert Williams Primary support relationship to patient:  PARENT Degree of support available:   strong    CURRENT CONCERNS Current Concerns  Post-Acute Placement   Other Concerns:   none    SOCIAL WORK ASSESSMENT / PLAN CSW met with pt at bedside.  Pt mother also present at bedside.  Pt was alert and oriented x4 during the course of the assessment. Mother is pt's main caregiver.  PT recommends CIR upon medical discharge.  CSW reviewed this plan with the pt and mother.  CSW reviewed plan B with pt and mother.  Plan A- CIR.  Plan B - SNF.  Pt and mother have already discussed and determined that SNF would be necessary.  Pt and mother first SNF choice is [facility]. CSW unsure if [facility] SNF is contracted with pt insurance ([place]).  Pt and mother agreeable to [facility] search.    Mother is hopeful regarding pt's progression and prognosis after STR.   Assessment/plan status:  Other - See comment Other assessment/ plan:   FL2  PASARR  CIR   Information/referral to community resources:   SNF    PATIENT'S/FAMILY'S RESPONSE TO PLAN OF CARE: Pt and mother is agreeable to SNF search as a back up to CIR.       Nonnie Done, [place] [phone]  Clinical Social Work

## 2013-09-12 NOTE — Evaluation (Signed)
Physical Therapy Evaluation Patient Details Name: Herbert Williams MRN: [id number] DOB: 12/26/1965 Today's Date: 09/12/2013   History of Present Illness  48 y.o. male s/p LEFT ARTHROSCOPY WITH FIXATION OF FEMUR FX., PERCUTANEOUS PINNING after a mechanical fall resulting in a fx of left femoral condyle and tibial plateau.  Clinical Impression  Patient is seen following the above procedure and presents with functional limitations due to the deficits listed below (see PT Problem List). Min-Mod assist +2 for mobility due to poor balance. Pt was Mod I at home prior to this admission using a rollator for mobility but reports an increase in frequency of falls. Feel patient may be a good candidate for CIR due to co-morbidities, allowing him to return to prior level of functional independence before d/c to home. Patient will benefit from skilled PT to increase their independence and safety with mobility to allow discharge to the venue listed below.    Follow Up Recommendations CIR    Equipment Recommendations  Rolling walker with 5" wheels    Recommendations for Other Services Rehab consult;OT consult     Precautions / Restrictions Precautions Precautions: Fall Required Braces or Orthoses: Knee Immobilizer - Left Restrictions Weight Bearing Restrictions: Yes LLE Weight Bearing: Non weight bearing      Mobility  Bed Mobility Overal bed mobility: Needs Assistance Bed Mobility: Supine to Sit     Supine to sit: Min guard     General bed mobility comments: Min guard for safety. VC for technique. Requires extra time.  Transfers Overall transfer level: Needs assistance Equipment used: Rolling walker (2 wheeled) Transfers: Sit to/from Stand Sit to Stand: +2 physical assistance;Min assist         General transfer comment: Min assist +2 for boost and balance. VC for hand placement. Performed from lowest bed setting and recliner.  Ambulation/Gait Ambulation/Gait assistance: +2  safety/equipment;Mod assist Ambulation Distance (Feet): 10 Feet (x2) Assistive device: Rolling walker (2 wheeled) Gait Pattern/deviations: Ataxic ("hop-to" pattern)   Gait velocity interpretation: Below normal speed for age/gender General Gait Details: Mod assist for balance loss and poor stability. Needs assist for walker control. Able to maintain NWB on LLE. Quite ataxic, needs stability for rolling walker palcement. Poor step quality with RLE due to decreased UE use on rolling walker.  Stairs            Wheelchair Mobility    Modified Rankin (Stroke Patients Only)       Balance Overall balance assessment: Needs assistance Sitting-balance support: No upper extremity supported;Feet supported Sitting balance-Leahy Scale: Fair     Standing balance support: Bilateral upper extremity supported Standing balance-Leahy Scale: Poor                               Pertinent Vitals/Pain Pt reports "spasms" with movement on LLE. No numerical value given. Patient repositioned in chair for comfort.     Home Living Family/patient expects to be discharged to:: Private residence Living Arrangements: Parent Available Help at Discharge: Family;Available PRN/intermittently Type of Home: House Home Access: Ramped entrance     Home Layout: One level Home Equipment: Walker - 4 wheels;Grab bars - toilet;Grab bars - tub/shower;Shower seat;Wheelchair - manual      Prior Function Level of Independence: Independent with assistive device(s)         Comments: Used RW for ambulation - lives with mother who cooks meals     Hand Dominance   Dominant Hand: Right  Extremity/Trunk Assessment   Upper Extremity Assessment: Defer to OT evaluation           Lower Extremity Assessment: LLE deficits/detail   LLE Deficits / Details: decreased strength and ROM as expected post op . Hx of ataxia.     Communication   Communication: No difficulties  Cognition  Arousal/Alertness: Awake/alert Behavior During Therapy: WFL for tasks assessed/performed Overall Cognitive Status: Within Functional Limits for tasks assessed                      General Comments      Exercises General Exercises - Lower Extremity Ankle Circles/Pumps: AROM;Both;10 reps;Supine Quad Sets: AROM;Left;10 reps;Seated      Assessment/Plan    PT Assessment Patient needs continued PT services  PT Diagnosis Difficulty walking;Abnormality of gait;Acute pain   PT Problem List Decreased strength;Decreased range of motion;Decreased activity tolerance;Decreased balance;Decreased mobility;Decreased coordination;Decreased knowledge of use of DME;Pain  PT Treatment Interventions DME instruction;Gait training;Functional mobility training;Therapeutic activities;Therapeutic exercise;Balance training;Neuromuscular re-education;Patient/family education;Wheelchair mobility training;Modalities   PT Goals (Current goals can be found in the Care Plan section) Acute Rehab PT Goals Patient Stated Goal: Be independent again PT Goal Formulation: With patient Time For Goal Achievement: 09/19/13 Potential to Achieve Goals: Good    Frequency Min 5X/week   Barriers to discharge Decreased caregiver support Pt lives with mother who is elderly. Would not be able to provide adequate support with current physical state.    Co-evaluation               End of Session Equipment Utilized During Treatment: Left knee immobilizer Activity Tolerance: Patient tolerated treatment well Patient left: in chair;with call bell/phone within reach;with family/visitor present Nurse Communication: Mobility status;Weight bearing status         Time: 5366-4403 PT Time Calculation (min): 39 min   Charges:   PT Evaluation $Initial PT Evaluation Tier I: 1 Procedure PT Treatments $Gait Training: 8-22 mins $Therapeutic Activity: 8-22 mins   PT G Codes:         Elayne Snare,  [place]  Ellouise Newer 09/12/2013, 10:23 AM

## 2013-09-12 NOTE — Progress Notes (Signed)
I participated in the care of this patient and agree with the above history, physical and evaluation. I performed a review of the history and a physical exam as detailed   Maleiah Dula Akio Taylon Louison MD  

## 2013-09-12 NOTE — Evaluation (Signed)
Occupational Therapy Evaluation Patient Details Name: Herbert Williams MRN: [id number] DOB: 1965-08-29 Today's Date: 09/12/2013    History of Present Illness 48 y.o. male s/p LEFT ARTHROSCOPY WITH FIXATION OF FEMUR FX., PERCUTANEOUS PINNING after a mechanical fall resulting in a fx of left femoral condyle and tibial plateau.   Clinical Impression   Pt currently requires total to total +2 for toileting and LB selfcare sit to stand.  Increased ataxia noted in his UEs and especially in his trunk with attempted RW use.  Feel he will benefit from acute care OT to help increase overall independence, however extended rehab at SNF level is strongly recommended.  Pt's mother cannot provide assist at his current level of care.     Follow Up Recommendations  SNF;Supervision/Assistance - 24 hour    Equipment Recommendations  3 in 1 bedside comode;Tub/shower bench       Precautions / Restrictions Precautions Precautions: Fall Required Braces or Orthoses: Knee Immobilizer - Left Restrictions Weight Bearing Restrictions: Yes LLE Weight Bearing: Weight bearing as tolerated      Mobility Bed Mobility                  Transfers Overall transfer level: Needs assistance Equipment used: Rolling walker (2 wheeled) Transfers: Sit to/from Stand Sit to Stand: +2 physical assistance;Min assist         General transfer comment: Pt requires max to total assist for sit to stand and transitoning his UEs from the bedside commode to the RW.    Balance Overall balance assessment: Needs assistance Sitting-balance support: No upper extremity supported;Feet supported Sitting balance-Leahy Scale: Fair     Standing balance support: Bilateral upper extremity supported Standing balance-Leahy Scale: Poor                              ADL Overall ADL's : Needs assistance/impaired Eating/Feeding: Modified independent;Sitting   Grooming: Wash/dry hands;Supervision/safety;Sitting   Upper  Body Bathing: Supervision/ safety;Sitting   Lower Body Bathing: +2 for physical assistance;Total assistance   Upper Body Dressing : Supervision/safety;Sitting;Cueing for safety   Lower Body Dressing: Cueing for safety;Supervision/safety   Toilet Transfer: BSC;RW;Stand-pivot;Total assistance   Toileting- Clothing Manipulation and Hygiene: Sit to/from stand;Total assistance         General ADL Comments: Pt with moderate ataxia in standing and when attempting to advance the RW during toilet transfer.       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  Decreased FM capabilities at time regarding fastening and buttoning but he was able to tie his right shoe with increased time. )       Cervical / Trunk Assessment Cervical / Trunk Assessment: Other exceptions Cervical / Trunk Exceptions: Pt maintains some right lateral cervical flexion at rest and with standing.    Communication Communication Communication: No difficulties   Cognition Arousal/Alertness: Awake/alert Behavior During Therapy: WFL for tasks assessed/performed Overall Cognitive Status: Within Functional Limits for tasks assessed  Home Living Family/patient expects to be discharged to:: Private residence Living Arrangements: Parent Available Help at Discharge: Family;Available PRN/intermittently Type of Home: House Home Access: Ramped entrance     Home Layout: One level     Bathroom Shower/Tub: Teacher, early years/pre: Standard     Home Equipment:  Walker - 4 wheels;Grab bars - toilet;Grab bars - tub/shower;Shower seat;Wheelchair - manual          Prior Functioning/Environment Level of Independence: Independent with assistive device(s)        Comments: Used RW for ambulation - lives with mother who cooks meals    OT Diagnosis: Acute pain;Generalized weakness   OT Problem List: Decreased strength;Decreased range of motion;Impaired balance (sitting and/or standing);Decreased knowledge of use of DME or AE;Pain   OT Treatment/Interventions: Self-care/ADL training;Patient/family education;Balance training;Therapeutic activities;DME and/or AE instruction    OT Goals(Current goals can be found in the care plan section) Acute Rehab OT Goals Patient Stated Goal: Pt wants to be able to do for himself. OT Goal Formulation: With patient Time For Goal Achievement: 09/19/13 Potential to Achieve Goals: Good  OT Frequency: Min 2X/week   Barriers to D/C: Decreased caregiver support  Pt's mother cannot provide physical assist.          End of Session Equipment Utilized During Treatment: Gait belt;Rolling walker;Left knee immobilizer Nurse Communication: Mobility status  Activity Tolerance: Patient tolerated treatment well Patient left: in chair;with call bell/phone within reach   Time: 1309-1350 OT Time Calculation (min): 41 min Charges:  OT General Charges $OT Visit: 1 Procedure OT Evaluation $Initial OT Evaluation Tier I: 1 Procedure OT Treatments $Self Care/Home Management : 23-37 mins G-Codes:    Derrich Gaby OTR/L 24-Sep-2013, 2:51 PM

## 2013-09-12 NOTE — Progress Notes (Signed)
Patient ID: Herbert Williams, male   DOB: [birth year], 48 y.o.   MRN: [id number]     Subjective:  Patient reports pain as mild to moderate.  Patient sitting up in bed in no acute distress  Objective:   VITALS:   Filed Vitals:   09/11/13 1454 09/11/13 1500 09/11/13 2100 09/12/13 0521  BP: 158/91 140/77 132/75 122/79  Pulse: 80 77 81 80  Temp: 98.3 F (36.8 C) 97.5 F (36.4 C) 97.9 F (36.6 C) 98.2 F (36.8 C)  TempSrc:  Oral    Resp: 13 16 16 14   Height:      Weight:      SpO2: 100% 100% 100% 96%    ABD soft Sensation intact distally Dorsiflexion/Plantar flexion intact Incision: dressing C/D/I and no drainage   Lab Results  Component Value Date   WBC 8.4 09/11/2013   HGB 12.6* 09/11/2013   HCT 39.2 09/11/2013   MCV 98.7 09/11/2013   PLT 179 09/11/2013     Assessment/Plan: 1 Day Post-Op   Principal Problem:   Fracture of femoral condyle, left, closed Active Problems:   Fracture, tibial plateau   Choroid plexus papilloma: s/p resection 1978   GERD (gastroesophageal reflux disease)   Hyperlipemia   HTN (hypertension)   Leukocytosis   Tibial plateau fracture, left   Advance diet Up with therapy Continue plan per medicine Continue knee immobilizer Okay for Discharge  NWB   Remonia Richter 09/12/2013, 9:13 AM   Edmonia Lynch MD [phone]

## 2013-09-13 MED ORDER — METHOCARBAMOL 500 MG PO TABS: 500.0000 mg | ORAL_TABLET | Freq: Four times a day (QID) | ORAL | Status: DC | PRN

## 2013-09-13 MED ORDER — OXYCODONE HCL 5 MG PO TABS: 5.0000 mg | ORAL_TABLET | Freq: Four times a day (QID) | ORAL | Status: DC | PRN

## 2013-09-13 MED ORDER — NICOTINE 14 MG/24HR TD PT24: 14.0000 mg | MEDICATED_PATCH | Freq: Every day | TRANSDERMAL | Status: DC

## 2013-09-13 MED ORDER — POLYETHYLENE GLYCOL 3350 17 G PO PACK: 17.0000 g | PACK | Freq: Every day | ORAL | Status: DC | PRN

## 2013-09-13 MED ORDER — THIAMINE HCL 100 MG PO TABS: 100.0000 mg | ORAL_TABLET | Freq: Every day | ORAL | Status: DC

## 2013-09-13 MED ORDER — ALUM & MAG HYDROXIDE-SIMETH 200-200-20 MG/5ML PO SUSP: 30.0000 mL | Freq: Four times a day (QID) | ORAL | Status: DC | PRN

## 2013-09-13 MED ORDER — ENOXAPARIN SODIUM 40 MG/0.4ML ~~LOC~~ SOLN: 40.0000 mg | SUBCUTANEOUS | Status: DC

## 2013-09-13 NOTE — Care Management Note (Signed)
CARE MANAGEMENT NOTE 09/13/2013  Patient:  Herbert Williams, Herbert Williams   Account Number:  [id number]  Date Initiated:  09/12/2013  Documentation initiated by:  Ricki Miller  Subjective/Objective Assessment:   48 yr old male s/p left tibia plateau fracture with percutaneous pinning.     Action/Plan:   Physical therapist is reccomending a CIR eval. Case manager will follow.  Patient will go to [facility] for shortterm rehab.   Anticipated DC Date:  09/13/2013   Anticipated DC Plan:  SKILLED NURSING FACILITY  In-house referral  Clinical Social Worker      DC Planning Services  CM consult      Choice offered to / List presented to:     DME arranged  NA        [place] arranged  NA      Status of service:  Completed, signed off Medicare Important Message given?  YES (If response is "NO", the following Medicare IM given date fields will be blank) Date Medicare IM given:  09/12/2013 Medicare IM given by:  Ricki Miller Date Additional Medicare IM given:   Additional Medicare IM given by:    Discharge Disposition:  [place]  Per UR Regulation:  Reviewed for med. necessity/level of care/duration of stay

## 2013-09-13 NOTE — Progress Notes (Signed)
CSW has placed dc packet in shadow chart, and contacted [facility] for transport to [facility]. No additional needs CSW signing off.  Hunt Oris, MSW, [place]

## 2013-09-13 NOTE — ED Provider Notes (Signed)
Medical screening examination/treatment/procedure(s) were conducted as a shared visit with non-physician practitioner(s) and myself.  I personally evaluated the patient during the encounter.   EKG Interpretation None      Patient here with fall from wheelchair. Hit L knee. Xray shows femoral condyle fracture, nondisplcaed tibial plateau. CT further evaluated. Dr. Percell Miller evaluated films, recommended surgery and admission. Patient admitted by medicine. Transferred to [facility].    Evelina Bucy, MD 09/13/13 1258

## 2013-09-13 NOTE — Discharge Planning (Signed)
Report called to Drucilla Chalet at [facility].

## 2013-09-13 NOTE — Discharge Summary (Signed)
Physician Discharge Summary  REINALDO HELT MRN: [id number] DOB/AGE: [birth year] 48 y.o.  PCP: Mathews Argyle, MD   Admit date: 09/10/2013 Discharge date: 09/13/2013  Discharge Diagnoses:   :   Fracture of femoral condyle, left, closed s/p LEFT ARTHROSCOPY WITH FIXATION OF FEMUR FX., PERCUTANEOUS PINNING    Fracture, tibial plateau   Choroid plexus papilloma: s/p resection 1978   GERD (gastroesophageal reflux disease)   Hyperlipemia   HTN (hypertension)   Leukocytosis   Tibial plateau fracture, left  Follow up recommendations Follow CBC weekly and the patient is on aspirin and Lovenox CMP in 1 week     Medication List    STOP taking these medications       ibuprofen 200 MG tablet  Commonly known as:  ADVIL,MOTRIN      TAKE these medications       alum & mag hydroxide-simeth 200-200-20 MG/5ML suspension  Commonly known as:  MAALOX/MYLANTA  Take 30 mLs by mouth every 6 (six) hours as needed for indigestion or heartburn (dyspepsia).     aspirin 81 MG tablet  Take 1 tablet (81 mg total) by mouth daily.     atenolol 50 MG tablet  Commonly known as:  TENORMIN  Take 50 mg by mouth daily.     carboxymethylcellulose 0.5 % Soln  Commonly known as:  REFRESH PLUS  2 drops 4 (four) times daily as needed (dry eyes.).     docusate sodium 100 MG capsule  Commonly known as:  COLACE  Take 1 capsule (100 mg total) by mouth 2 (two) times daily.     enoxaparin 40 MG/0.4ML injection  Commonly known as:  LOVENOX  Inject 0.4 mLs (40 mg total) into the skin daily.     fenofibrate 160 MG tablet  Take 160 mg by mouth daily.     lansoprazole 30 MG capsule  Commonly known as:  PREVACID  Take 30 mg by mouth daily at 12 noon.     methocarbamol 500 MG tablet  Commonly known as:  ROBAXIN  Take 1 tablet (500 mg total) by mouth every 6 (six) hours as needed for muscle spasms.     nicotine 14 mg/24hr patch  Commonly known as:  NICODERM CQ - dosed in mg/24 hours  Place 1  patch (14 mg total) onto the skin daily.     ondansetron 4 MG tablet  Commonly known as:  ZOFRAN  Take 1 tablet (4 mg total) by mouth every 8 (eight) hours as needed for nausea or vomiting.     oxyCODONE 5 MG immediate release tablet  Commonly known as:  Oxy IR/ROXICODONE  Take 1-2 tablets (5-10 mg total) by mouth every 6 (six) hours as needed for severe pain.     polyethylene glycol packet  Commonly known as:  MIRALAX / GLYCOLAX  Take 17 g by mouth daily as needed for mild constipation.     thiamine 100 MG tablet  Take 1 tablet (100 mg total) by mouth daily.        Discharge Condition:  Disposition:    Consults:  Orthopedic  Significant Diagnostic Studies: Dg Hip Complete Left  09/10/2013   CLINICAL DATA:  Fall.  EXAM: LEFT HIP - COMPLETE 2+ VIEW  COMPARISON:  04/24/2007.  FINDINGS: Soft tissue structures are unremarkable. Gas pattern is nonspecific. Stool is noted throughout the colon. Pill fragments are noted over the abdomen. Catheters noted in stable position over the abdomen. Left hip replacement. Sclerosis and cystic changes right femoral  head consistent with avascular necrosis. Loose bodies are noted over the right hip joint.  IMPRESSION: 1. No acute bony abnormality. 2. Avascular necrosis right hip. 3. Left hip replacement in good anatomic alignment.   Electronically Signed   By: Marcello Moores  Register   On: 09/10/2013 12:08   Dg Tibia/fibula Left  09/10/2013   CLINICAL DATA:  Fall.  Ankle pain.  Soft tissue swelling laterally.  EXAM: LEFT TIBIA AND FIBULA - 2 VIEW  COMPARISON:  Left knee films same date. Left ankle films 02/02/2007.  FINDINGS: Fracture of the left femoral condylar region suspected as noted on the knee film report.  No tibia or fibular fracture is detected. There are degenerative changes of the lateral malleolar and distal tibiotalar region.  Spur at the level of the Achilles tendon insertion and plantar fashion origin.  IMPRESSION: Fracture of the left femoral  condylar region suspected as noted on the knee film report.  No tibia or fibular fracture is detected. There are degenerative changes of the lateral malleolar and distal tibiotalar region.   Electronically Signed   By: Chauncey Cruel M.D.   On: 09/10/2013 11:14   Ct Knee Left Wo Contrast  09/10/2013   CLINICAL DATA:  Posterior knee pain after falling yesterday. Evaluate lateral femoral condylar fracture.  EXAM: CT OF THE LEFT KNEE WITHOUT CONTRAST  TECHNIQUE: Multidetector CT imaging of the left knee was performed according to the standard protocol. Multiplanar CT image reconstructions were also generated.  COMPARISON:  Radiographs same date.  FINDINGS: The bones appear diffusely demineralized with the trabecular prominence. As demonstrated radiographically, there is a mildly displaced fracture involving the posterolateral aspect of the lateral femoral condyle. This demonstrates up to 4 mm of articular surface offset laterally. The lateral metaphyseal cortex is also mildly displaced.  The medial femoral condyle is intact. There is a nondisplaced fracture involving the medial tibial plateau posteromedially. The lateral tibial plateau and proximal fibula are intact. The patella is intact.  There is a small joint effusion without definite layering intra-articular fat. There is mild soft tissue edema surrounding the knee.  IMPRESSION: 1. Mildly displaced intra-articular fracture involving the lateral femoral condyle posterolaterally. There is mild articular surface offset as described. 2. Nondisplaced fracture of the medial tibial plateau posteromedially. 3. Prominent osseous demineralization for age suggesting possible metabolic disorder or disuse. Correlate clinically.   Electronically Signed   By: Camie Patience M.D.   On: 09/10/2013 12:44   Dg Knee Complete 4 Views Left  09/10/2013   CLINICAL DATA:  Fall.  Pain.  EXAM: LEFT KNEE - COMPLETE 4+ VIEW  COMPARISON:  None.  FINDINGS: Fracture of the left femoral condyles  which is unusual and I suspect acute. Slight displacement of fracture fragment. Small joint effusion. Given the unusual appearance, CT imaging may be considered to confirm that this is acute and better define the degree of displacement of the fracture fragment.  IMPRESSION: Fracture of the left femoral condyles which is unusual and I suspect acute. Slight displacement of fracture fragment. Small joint effusion. Given the unusual appearance, CT imaging may be considered to confirm that this is acute and better define the degree of displacement of the fracture fragment.   Electronically Signed   By: Chauncey Cruel M.D.   On: 09/10/2013 11:10   Dg Knee Left Port  09/11/2013   CLINICAL DATA:  Distal femur fracture.  EXAM: PORTABLE LEFT KNEE - 1-2 VIEW  COMPARISON:  CT scan dated 09/10/2013  FINDINGS: The patient has  undergone open reduction and internal fixation of the fracture of the lateral femoral condyle. Alignment and position of the fracture fragments is anatomic. There is a single fixation screw in place. Osteopenia. Air and fluid in the joint, as expected.  IMPRESSION: Open reduction and internal fixation of lateral femoral condyle fracture.   Electronically Signed   By: Rozetta Nunnery M.D.   On: 09/11/2013 15:05   Dg C-arm 1-60 Min  09/11/2013   CLINICAL DATA:  Femur fracture ORIF.  EXAM: LEFT KNEE - 3 VIEW; DG C-ARM 61-120 MIN  COMPARISON:  CT, 09/10/2013  FINDINGS: A screw has been placed from lateral to medial reducing the lateral femoral condylar fracture into near anatomic alignment. The orthopedic hardware is well-seated. No evidence of an operative complication.  IMPRESSION: ORIF of a fracture of the left lateral femoral condyle.   Electronically Signed   By: Lajean Manes M.D.   On: 09/11/2013 16:08   Dg Knee 2 Views Left  09/11/2013   CLINICAL DATA:  Femur fracture ORIF.  EXAM: LEFT KNEE - 3 VIEW; DG C-ARM 61-120 MIN  COMPARISON:  CT, 09/10/2013  FINDINGS: A screw has been placed from lateral to  medial reducing the lateral femoral condylar fracture into near anatomic alignment. The orthopedic hardware is well-seated. No evidence of an operative complication.  IMPRESSION: ORIF of a fracture of the left lateral femoral condyle.   Electronically Signed   By: Lajean Manes M.D.   On: 09/11/2013 16:08       Microbiology: Recent Results (from the past 240 hour(s))  URINE CULTURE     Status: None   Collection Time    09/10/13  2:54 PM      Result Value Ref Range Status   Specimen Description URINE, CLEAN CATCH   Final   Special Requests NONE   Final   Culture  Setup Time     Final   Value: 09/10/2013 20:02     Performed at [place]     Final   Value: NO GROWTH     Performed at [facility]   Culture     Final   Value: NO GROWTH     Performed at [facility]   Report Status 09/11/2013 FINAL   Final  SURGICAL PCR SCREEN     Status: None   Collection Time    09/10/13  5:27 PM      Result Value Ref Range Status   MRSA, PCR NEGATIVE  NEGATIVE Final   Staphylococcus aureus NEGATIVE  NEGATIVE Final   Comment:            The Xpert SA Assay (FDA     approved for NASAL specimens     in patients over 54 years of age),     is one component of     a comprehensive surveillance     program.  Test performance has     been validated by [employer] for patients greater     than or equal to 85 year old.     It is not intended     to diagnose infection nor to     guide or monitor treatment.     Labs: No results found for this or any previous visit (from the past 48 hour(s)).   HPI :*Herbert Williams is a 48 y.o. 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 Patient states 1 day prior to admission he slipped on a paper towel and fell on his left side.  Patient stated that he was able to get up and ambulate yesterday. However at 3 AM on the morning of admission patient woke up with a stiff leg with some swelling and pain. Patient subsequently presented to the ED. Patient states was unable to bear weight on his left lower extremity. Patient denies any fevers, no chills, no chest pain, no shortness of breath, no syncope, no nausea, no vomiting, no abdominal pain, no dysuria, no cough, no weakness.  Patient was seen in the ED CBC done had a white count of 14.1 otherwise was within normal limits. Basic metabolic profile done was unremarkable. Extremity left hip showed avascular necrosis of the right hip and left hip replacement in good anatomic alignment. Left knee showed a fracture of the left femoral condyles with slight displacement of fracture fragment. Extraocular left tibia/fibula showed fracture of the left femoral condylar region no tibiofibular fracture detected. CT of the left knee showed mildly displaced intra-articular fracture involving the lateral femoral condyle posterolaterally. Nondisplaced fracture of the medial tibial plateau posteromedially.  EDPA status she spoke with Dr. Percell Miller of orthopedics who requested patient be admitted to the medicine service and be transferred to [place]:  #1 left tibial plateau fracture/left fracture of the femoral condyle  Per ortho  CIR versus SNF versus home health  Continue knee immobilizer  Up with therapy  Okay for discharge to [facility] vs SNF as per orthopedics NWB Lovenox for DVT prophylaxis 3 weeks  #2 gastroesophageal reflux disease  PPI.  #3 hypertension  Continue home regimen of atenolol.  #4 leukocytosis  Improved - no acute infection  #5 hyperlipidemia  fasting lipid panel normal- Continue TriCor.  #6 history of choroid plexus papilloma status post resection in 1978  Neurological deficits are stable (left facial droop, poor balance)- needs  walker to walk at baseline  #7 Hx of ETOH use  Per family patient drinks 3 large cans of beer daily therefore, Placed on Ativan withdrawal CIWA protocol  - no signs of withdrawal as of yet- follow  #8 Tobacco use  Nicotine patch. Tobacco cessation     Discharge Exam:  Blood pressure 128/73, pulse 76, temperature 98 F (36.7 C), temperature source Oral, resp. rate 16, height 5\' 7"  (1.702 m), weight 83.7 kg (184 lb 8.4 oz), SpO2 99.00%.   General: alert & oriented x 3 In NAD  Cardiovascular: RRR, nl S1 s2  Respiratory: Decreased breath sounds at the bases, scattered rhonchi, no crackles  Abdomen: soft +BS NT/ND, no masses palpable  Extremities: Dorsiflexion/Plantar flexion intact  Incision: dressing C/D/I and no drainage          Follow-up Information   Follow up with MURPHY, TIMOTHY, D, MD In 2 weeks.   Specialty:  Orthopedic Surgery   Contact information:   [address] [place] [address] [phone]       Follow up with Mathews Argyle, MD. Schedule an appointment as soon as possible for a visit in 1 week.   Specialty:  Internal Medicine   Contact information:   [address] [place] [address] [phone]       Signed: Reyne Dumas 09/13/2013, 11:07 AM

## 2013-09-13 NOTE — Progress Notes (Signed)
I participated in the care of this patient and agree with the above history, physical and evaluation. I performed a review of the history and a physical exam as detailed   Peyton Rossner Ysidro Zebastian Carico MD  

## 2013-09-13 NOTE — Progress Notes (Signed)
     Subjective:  Patient reports pain as mild.  Pt sitting up in bed in no acute distress.  He does note some intermittent muscle cramping/spasm in the affected leg, which increases his discomfort.  Otherwise well and asking about discharge.  Objective:   VITALS:   Filed Vitals:   09/12/13 1508 09/12/13 2111 09/13/13 0450 09/13/13 0500  BP: 120/65 123/72 123/70   Pulse:  79 75   Temp:  98 F (36.7 C) 98 F (36.7 C)   TempSrc:      Resp:  16 16   Height:      Weight:    83.7 kg (184 lb 8.4 oz)  SpO2:  95% 99%     ABD soft Sensation intact distally Dorsiflexion/Plantar flexion intact Incision: no drainage   Lab Results  Component Value Date   WBC 8.4 09/11/2013   HGB 12.6* 09/11/2013   HCT 39.2 09/11/2013   MCV 98.7 09/11/2013   PLT 179 09/11/2013     Assessment/Plan: 2 Days Post-Op   Principal Problem:   Fracture of femoral condyle, left, closed Active Problems:   Fracture, tibial plateau   Choroid plexus papilloma: s/p resection 1978   GERD (gastroesophageal reflux disease)   Hyperlipemia   HTN (hypertension)   Leukocytosis   Tibial plateau fracture, left Intermittent muscle cramping/spasm: Rx for po Robaxin today.  Advance diet Up with therapy Continue knee immobilizer Okay for discharge to [facility] vs SNF NWB   Remonia Richter 09/13/2013, 7:14 AM   Edmonia Lynch MD [phone]

## 2013-09-13 NOTE — Progress Notes (Signed)
Physical Therapy Treatment Patient Details Name: Herbert Williams MRN: [id number] DOB: [birth year] Today's Date: 09/13/2013    History of Present Illness 48 y.o. male s/p LEFT ARTHROSCOPY WITH FIXATION OF FEMUR FX., PERCUTANEOUS PINNING after a mechanical fall resulting in a fx of left femoral condyle and tibial plateau.    PT Comments    Pt. Continues to be hindered with his ataxic gait pattern and instability  but tolerated increased distance today.  He asked me to phone his mother to give her an update and I did so.  Pt's mom, Ricard Faulkner, is a former Animal nutritionist with [employer] for many years and well known to this therapist.  I informed her that pt's [employer] Medicare will not approve IP rehab and that I have updated my recommendation for SNF for rehab.  She is in agreement.    Follow Up Recommendations  SNF;Supervision/Assistance - 24 hour;Supervision for mobility/OOB     Equipment Recommendations  Rolling walker with 5" wheels    Recommendations for Other Services       Precautions / Restrictions Precautions Precautions: Fall Required Braces or Orthoses: Knee Immobilizer - Left Knee Immobilizer - Left: Other (comment) (KI was repositioned and realigned correctly) Restrictions Weight Bearing Restrictions: Yes LLE Weight Bearing: Non weight bearing Other Position/Activity Restrictions: Orders set indicated pt. is WBAT  however PA progress note states NWB.  PT is complying with NWB as in todays progress note    Mobility  Bed Mobility Overal bed mobility: Needs Assistance Bed Mobility: Supine to Sit     Supine to sit: Min guard     General bed mobility comments: Min guard for safety. VC for technique. Requires extra time. Uses bed rail heavily for assisting self  Transfers Overall transfer level: Needs assistance Equipment used: Rolling walker (2 wheeled) Transfers: Sit to/from Stand Sit to Stand: +2 physical assistance;Min assist         General transfer comment: pt.  achieved standing with +2 min assist and stabilizing assist due to instability  Ambulation/Gait Ambulation/Gait assistance: +2 physical assistance;Min assist;Mod assist Ambulation Distance (Feet): 20 Feet Assistive device: Rolling walker (2 wheeled) Gait Pattern/deviations: Ataxic (single leg hop pattern due to NWB L LE) Gait velocity: decreased   General Gait Details: Compliant with NWB L LE, needed +2 min and mod assist at times for stabilization due to ataxic instability.  Also requires management of RW.     Stairs            Wheelchair Mobility    Modified Rankin (Stroke Patients Only)       Balance Overall balance assessment: Needs assistance         Standing balance support: Bilateral upper extremity supported;During functional activity Standing balance-Leahy Scale: Poor                      Cognition Arousal/Alertness: Awake/alert Behavior During Therapy: WFL for tasks assessed/performed Overall Cognitive Status: Within Functional Limits for tasks assessed                      Exercises General Exercises - Lower Extremity Ankle Circles/Pumps: AROM;Both;10 reps;Supine    General Comments        Pertinent Vitals/Pain See vitals tab No distress, pain did not limit his mobility today.  He reports he had pain med given 2 hours prior to PT session    Home Living  Prior Function            PT Goals (current goals can now be found in the care plan section) Progress towards PT goals: Progressing toward goals    Frequency  Min 5X/week    PT Plan Current plan remains appropriate    Co-evaluation             End of Session Equipment Utilized During Treatment: Left knee immobilizer Activity Tolerance: Patient tolerated treatment well Patient left: in chair;with call bell/phone within reach     Time: 0755-0822 PT Time Calculation (min): 27 min  Charges:  $Gait Training: 23-37 mins                     G Codes:      Ladona Ridgel 09/13/2013, 8:35 AM Gerlean Ren PT Acute Rehab Services [phone] [place] [phone]

## 2013-09-13 NOTE — Progress Notes (Signed)
Clinical Social Work Department CLINICAL SOCIAL WORK PLACEMENT NOTE 09/13/2013  Patient:  JAECE, DUCHARME  Account Number:  [id number] Admit date:  09/10/2013  Clinical Social Worker:  Hunt Oris, Latanya Presser  Date/time:  09/13/2013 01:41 PM  Clinical Social Work is seeking post-discharge placement for this patient at the following level of care:   SKILLED NURSING   (*CSW will update this form in Epic as items are completed)     Patient/family provided with [facility] Department of Clinical Social Work's list of facilities offering this level of care within the geographic area requested by the patient (or if unable, by the patient's family).    Patient/family informed of their freedom to choose among providers that offer the needed level of care, that participate in Medicare, Medicaid or managed care program needed by the patient, have an available bed and are willing to accept the patient.    Patient/family informed of [facility]' ownership interest in [facility], as well as of the fact that they are under no obligation to receive care at this facility.  PASARR submitted to EDS on 09/11/2013 PASARR number received on 09/11/2013  FL2 transmitted to all facilities in geographic area requested by pt/family on   FL2 transmitted to all facilities within larger geographic area on   Patient informed that his/her managed care company has contracts with or will negotiate with  certain facilities, including the following:     Patient/family informed of bed offers received:  09/13/2013 Patient chooses bed at [facility] Physician recommends and patient chooses bed at    Patient to be transferred to [facility] on  09/13/2013 Patient to be transferred to facility by [facility] Patient and family notified of transfer on 09/13/2013 Name of family member notified:  Pt's mother was contacted and made aware.  The following physician request were entered in  Epic:   Additional Comments: Hunt Oris, MSW, [place]

## 2013-09-17 ENCOUNTER — Non-Acute Institutional Stay (SKILLED_NURSING_FACILITY): Payer: Commercial Managed Care - HMO | Admitting: Internal Medicine

## 2013-09-17 DIAGNOSIS — I10 Essential (primary) hypertension: Secondary | ICD-10-CM

## 2013-09-17 DIAGNOSIS — S72412S Displaced unspecified condyle fracture of lower end of left femur, sequela: Secondary | ICD-10-CM

## 2013-09-17 DIAGNOSIS — K219 Gastro-esophageal reflux disease without esophagitis: Secondary | ICD-10-CM

## 2013-09-17 DIAGNOSIS — K5900 Constipation, unspecified: Secondary | ICD-10-CM | POA: Insufficient documentation

## 2013-09-17 DIAGNOSIS — S8290XS Unspecified fracture of unspecified lower leg, sequela: Secondary | ICD-10-CM

## 2013-09-17 NOTE — Progress Notes (Signed)
HISTORY & PHYSICAL  DATE: 09/17/2013   FACILITY: [facility]  LEVEL OF CARE: SNF (31)  ALLERGIES:  Allergies  Allergen Reactions  . Penicillins Rash    CHIEF COMPLAINT:  Manage left femoral condyle fracture, hypertension and GERD  HISTORY OF PRESENT ILLNESS: Patient is a 48 year old Caucasian male who is admitted to this facility for short-term rehabilitation.  LEFT FEMORAL CONDYLE FX: Patient had a fall and was having left knee pain. In the ER he was diagnosed with a closed femoral condyle fracture. He underwent proctoscopy with fixation with percutaneous pinning. He tolerated the procedure well and now is in immobilizer. He denies knee pain.  HTN: Pt 's HTN remains stable.  Denies CP, sob, DOE, pedal edema, headaches, dizziness or visual disturbances.  No complications from the medications currently being used.  Last BP : 140/86.  GERD: pt's GERD is stable.  Denies ongoing heartburn, abd. Pain, nausea or vomiting.  Currently on a PPI & tolerates it without any adverse reactions.  PAST MEDICAL HISTORY :  Past Medical History  Diagnosis Date  . Brain tumor 1979  . 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 GERD (gastroesophageal reflux disease) 09/10/2013  . Hyperlipemia 09/10/2013  . HTN (hypertension) 09/10/2013    PAST SURGICAL HISTORY: Past Surgical History  Procedure Laterality Date  . Brain surgery    . Total hip arthroplasty Left     SOCIAL HISTORY:  reports that he has been smoking Cigarettes.  He has been smoking about 0.25 packs per day. He does not have any smokeless tobacco history on file. He reports that he drinks about 12.6 ounces of alcohol per week. He reports that he does not use illicit drugs.  FAMILY HISTORY: None  CURRENT MEDICATIONS: Reviewed per MAR/see medication list  REVIEW OF SYSTEMS:  See HPI otherwise 14 point ROS is negative.  PHYSICAL  EXAMINATION  VS:  See VS section  GENERAL: no acute distress, normal body habitus EYES: conjunctivae normal, sclerae normal, normal eye lids MOUTH/THROAT: lips without lesions,no lesions in the mouth,tongue is without lesions,uvula elevates in midline NECK: supple, trachea midline, no neck masses, no thyroid tenderness, no thyromegaly LYMPHATICS: no LAN in the neck, no supraclavicular LAN RESPIRATORY: breathing is even & unlabored, BS CTAB CARDIAC: RRR, no murmur,no extra heart sounds, no edema GI:  ABDOMEN: abdomen soft, normal BS, no masses, no tenderness  LIVER/SPLEEN: no hepatomegaly, no splenomegaly MUSCULOSKELETAL: HEAD: normal to inspection  EXTREMITIES: LEFT UPPER EXTREMITY: full range of motion, normal strength & tone RIGHT UPPER EXTREMITY:  full range of motion, normal strength & tone LEFT LOWER EXTREMITY: Not tested-in immoilizer RIGHT LOWER EXTREMITY:  full range of motion, normal strength & tone PSYCHIATRIC: the patient is alert & oriented to person, affect & behavior appropriate  LABS/RADIOLOGY:  Labs reviewed: Basic Metabolic Panel:  Recent Labs  09/10/13 1316 09/11/13 0549  NA 139 140  K 4.3 4.0  CL 99 101  CO2 27 27  GLUCOSE 110* 106*  BUN 12 9  CREATININE 0.72 0.74  CALCIUM 10.1 9.1   CBC:  Recent Labs  09/10/13 1316 09/11/13 0549  WBC 14.1* 8.4  HGB 14.5 12.6*  HCT 43.1 39.2  MCV 96.6 98.7  PLT 237 179   Lipid Panel:  Recent Labs  09/11/13 0549  HDL 63    LEFT KNEE - COMPLETE 4+ VIEW   COMPARISON:  None.  FINDINGS: Fracture of the left femoral condyles which is unusual and I suspect acute. Slight displacement of fracture fragment. Small joint effusion. Given the unusual appearance, CT imaging may be considered to confirm that this is acute and better define the degree of displacement of the fracture fragment.   IMPRESSION: Fracture of the left femoral condyles which is unusual and I suspect acute. Slight displacement of  fracture fragment. Small joint effusion. Given the unusual appearance, CT imaging may be considered to confirm that this is acute and better define the degree of displacement of the fracture fragment. LEFT TIBIA AND FIBULA - 2 VIEW   COMPARISON:  Left knee films same date. Left ankle films 02/02/2007.   FINDINGS: Fracture of the left femoral condylar region suspected as noted on the knee film report.   No tibia or fibular fracture is detected. There are degenerative changes of the lateral malleolar and distal tibiotalar region.   Spur at the level of the Achilles tendon insertion and plantar fashion origin.   IMPRESSION: Fracture of the left femoral condylar region suspected as noted on the knee film report.   No tibia or fibular fracture is detected. There are degenerative changes of the lateral malleolar and distal tibiotalar region.     LEFT HIP - COMPLETE 2+ VIEW   COMPARISON:  04/24/2007.   FINDINGS: Soft tissue structures are unremarkable. Gas pattern is nonspecific. Stool is noted throughout the colon. Pill fragments are noted over the abdomen. Catheters noted in stable position over the abdomen. Left hip replacement. Sclerosis and cystic changes right femoral head consistent with avascular necrosis. Loose bodies are noted over the right hip joint.   IMPRESSION: 1. No acute bony abnormality. 2. Avascular necrosis right hip. 3. Left hip replacement in good anatomic alignment.   CT OF THE LEFT KNEE WITHOUT CONTRAST   TECHNIQUE: Multidetector CT imaging of the left knee was performed according to the standard protocol. Multiplanar CT image reconstructions were also generated.   COMPARISON:  Radiographs same date.   FINDINGS: The bones appear diffusely demineralized with the trabecular prominence. As demonstrated radiographically, there is a mildly displaced fracture involving the posterolateral aspect of the lateral femoral condyle. This demonstrates up to  4 mm of articular surface offset laterally. The lateral metaphyseal cortex is also mildly displaced.   The medial femoral condyle is intact. There is a nondisplaced fracture involving the medial tibial plateau posteromedially. The lateral tibial plateau and proximal fibula are intact. The patella is intact.   There is a small joint effusion without definite layering intra-articular fat. There is mild soft tissue edema surrounding the knee.   IMPRESSION: 1. Mildly displaced intra-articular fracture involving the lateral femoral condyle posterolaterally. There is mild articular surface offset as described. 2. Nondisplaced fracture of the medial tibial plateau posteromedially. 3. Prominent osseous demineralization for age suggesting possible metabolic disorder or disuse. Correlate clinically.     PORTABLE LEFT KNEE - 1-2 VIEW   COMPARISON:  CT scan dated 09/10/2013   FINDINGS: The patient has undergone open reduction and internal fixation of the fracture of the lateral femoral condyle. Alignment and position of the fracture fragments is anatomic. There is a single fixation screw in place. Osteopenia. Air and fluid in the joint, as expected.   IMPRESSION: Open reduction and internal fixation of lateral femoral condyle fracture. LEFT KNEE - 3 VIEW; DG C-ARM 61-120 MIN   COMPARISON:  CT, 09/10/2013   FINDINGS: A screw has been placed from lateral to medial reducing the lateral  femoral condylar fracture into near anatomic alignment. The orthopedic hardware is well-seated. No evidence of an operative complication.   IMPRESSION: ORIF of a fracture of the left lateral femoral condyle LEFT KNEE - 3 VIEW; DG C-ARM 61-120 MIN   COMPARISON:  CT, 09/10/2013   FINDINGS: A screw has been placed from lateral to medial reducing the lateral femoral condylar fracture into near anatomic alignment. The orthopedic hardware is well-seated. No evidence of an operative complication.     IMPRESSION: ORIF of a fracture of the left lateral femoral condyle.   ASSESSMENT/PLAN:  Left femoral condyle fracture-status post surgical repair. Continued rehabilitation. Hypertension-blood pressure borderline. We'll monitor. GERD-continue PPI Constipation-continue laxatives Hyper triglyceridemia-continue fenofibrate Tobacco abuse-taper off nicotine patch Check CBC and BMP  I have reviewed patient's medical records received at admission/from hospitalization.  CPT CODE: 00511  Luzelena Heeg Y Lynleigh Kovack, [place] [phone]

## 2013-09-19 ENCOUNTER — Encounter (HOSPITAL_COMMUNITY): Payer: Self-pay | Admitting: Orthopedic Surgery

## 2013-09-21 ENCOUNTER — Encounter (HOSPITAL_COMMUNITY): Payer: Self-pay | Admitting: Orthopedic Surgery

## 2013-09-26 ENCOUNTER — Non-Acute Institutional Stay (SKILLED_NURSING_FACILITY): Payer: Commercial Managed Care - HMO | Admitting: Internal Medicine

## 2013-09-26 DIAGNOSIS — R7309 Other abnormal glucose: Secondary | ICD-10-CM

## 2013-09-26 DIAGNOSIS — R739 Hyperglycemia, unspecified: Secondary | ICD-10-CM

## 2013-10-01 NOTE — Progress Notes (Signed)
Patient ID: Herbert Williams, male   DOB: 07-27-1965, 48 y.o.   MRN: [id number]           PROGRESS NOTE  DATE: 09/26/2013        FACILITY:  [facility]  LEVEL OF CARE: SNF (31)  Acute Visit   CHIEF COMPLAINT:  Manage hyperglycemia.    HISTORY OF PRESENT ILLNESS: I was requested by the staff to assess the patient regarding above problem(s):  On 09/24/2013:  Patient's glucose level was 127.  He does not have a history of diabetes mellitus and he is not on prednisone.  He denies polyuria or polydipsia.    PAST MEDICAL HISTORY : Reviewed.  No changes/see problem list  CURRENT MEDICATIONS: Reviewed per MAR/see medication list  PHYSICAL EXAMINATION  VS: see VS section  GENERAL: no acute distress, normal body habitus RESPIRATORY: breathing is even & unlabored, BS CTAB CARDIAC: RRR, no murmur,no extra heart sounds, no edema  ASSESSMENT/PLAN:  Hyperglycemia.  New problem.  Check hemoglobin A1c.    CPT CODE: 49201           Tyra Michelle Y Jazmaine Fuelling, [place] [phone]

## 2013-10-03 ENCOUNTER — Non-Acute Institutional Stay (SKILLED_NURSING_FACILITY): Payer: Commercial Managed Care - HMO | Admitting: Internal Medicine

## 2013-10-03 DIAGNOSIS — R109 Unspecified abdominal pain: Secondary | ICD-10-CM

## 2013-10-10 ENCOUNTER — Non-Acute Institutional Stay (SKILLED_NURSING_FACILITY): Payer: Commercial Managed Care - HMO | Admitting: Internal Medicine

## 2013-10-10 DIAGNOSIS — K219 Gastro-esophageal reflux disease without esophagitis: Secondary | ICD-10-CM

## 2013-10-10 DIAGNOSIS — S82142S Displaced bicondylar fracture of left tibia, sequela: Secondary | ICD-10-CM

## 2013-10-10 DIAGNOSIS — S8290XS Unspecified fracture of unspecified lower leg, sequela: Secondary | ICD-10-CM

## 2013-10-10 DIAGNOSIS — E781 Pure hyperglyceridemia: Secondary | ICD-10-CM

## 2013-10-10 DIAGNOSIS — I10 Essential (primary) hypertension: Secondary | ICD-10-CM

## 2013-10-10 NOTE — Progress Notes (Signed)
Patient ID: Herbert Williams, male   DOB: Nov 22, 1965, 48 y.o.   MRN: [id number]           PROGRESS NOTE  DATE: 10/03/2013          FACILITY:  [facility]  LEVEL OF CARE: SNF (31)  Acute Visit  CHIEF COMPLAINT:  Manage bilateral lower abdominal pain.    HISTORY OF PRESENT ILLNESS: I was requested by the staff to assess the patient regarding above problem(s):  Patient is complaining of bilateral lower abdominal pain, going on for several days.  Symptoms are worse after meals.  He denies nausea, vomiting, diarrhea, constipation, or dysuria.  He denies low back pain.  He cannot identify precipitating or alleviating factors.    PAST MEDICAL HISTORY : Reviewed.  No changes/see problem list  CURRENT MEDICATIONS: Reviewed per MAR/see medication list  REVIEW OF SYSTEMS:  GENERAL: no change in appetite, no fatigue, no weight changes, no fever, chills or weakness RESPIRATORY: no cough, SOB, DOE,, wheezing, hemoptysis CARDIAC: no chest pain, edema or palpitations GI: see HPI, complains of abdominal pain          PHYSICAL EXAMINATION  VS: see VS section  GENERAL: no acute distress, normal body habitus NECK: supple, trachea midline, no neck masses, no thyroid tenderness, no thyromegaly RESPIRATORY: breathing is even & unlabored, BS CTAB CARDIAC: RRR, no murmur,no extra heart sounds, no edema GI: abdomen soft, normal BS, no masses, no tenderness, no hepatomegaly, no splenomegaly PSYCHIATRIC: the patient is alert & oriented to person, affect & behavior appropriate  ASSESSMENT/PLAN:  Bilateral lower abdominal pain.  New problem.  I am not sure what the etiology is.  We will obtain a UA, culture and sensitivities.  Also, start simethicone 80 mg  t.i.d.    CPT CODE: 22025             Gayani Y Dasanayaka, MD [place] [phone]

## 2013-10-12 DIAGNOSIS — E781 Pure hyperglyceridemia: Secondary | ICD-10-CM | POA: Insufficient documentation

## 2013-10-12 NOTE — Progress Notes (Signed)
         PROGRESS NOTE  DATE: 10/10/2013  FACILITY: Nursing Home Location: [facility]  LEVEL OF CARE: SNF (31)  Routine Visit  CHIEF COMPLAINT:  Manage left tibial plateau fracture, hypertension and hypertriglyceridemia  HISTORY OF PRESENT ILLNESS:  REASSESSMENT OF ONGOING PROBLEM(S):  LEFT TIBIAL PLATEAU FX: Patient had a fall and sustained this fracture. He underwent surgical repair and currently receiving rehabilitation in this facility. His left lower extremity is in an immobilizer. He denies left knee pain. He is tolerating his pain medications without any side effects.  HTN: Pt 's HTN remains stable.  Denies CP, sob, DOE, pedal edema, headaches, dizziness or visual disturbances.  No complications from the medications currently being used.  Last BP : 146/84  HYPERTRIGLYCERIDEMIA: Patient is currently on TriCor and tolerates it without any side effects. Her recent fasting lipid panel is not available.  PAST MEDICAL HISTORY : Reviewed.  No changes/see problem list  CURRENT MEDICATIONS: Reviewed per MAR/see medication list  REVIEW OF SYSTEMS:  GENERAL: no change in appetite, no fatigue, no weight changes, no fever, chills or weakness RESPIRATORY: no cough, SOB, DOE, wheezing, hemoptysis CARDIAC: no chest pain, edema or palpitations GI: no abdominal pain, diarrhea, constipation, heart burn, nausea or vomiting  PHYSICAL EXAMINATION  VS:  See VS section  GENERAL: no acute distress, normal body habitus EYES: conjunctivae normal, sclerae normal, normal eye lids NECK: supple, trachea midline, no neck masses, no thyroid tenderness, no thyromegaly LYMPHATICS: no LAN in the neck, no supraclavicular LAN RESPIRATORY: breathing is even & unlabored, BS CTAB CARDIAC: RRR, no murmur,no extra heart sounds, no edema GI: abdomen soft, normal BS, no masses, no tenderness, no hepatomegaly, no splenomegaly PSYCHIATRIC: the patient is alert & oriented to person,  affect & behavior appropriate  LABS/RADIOLOGY:  8-15 glucose 127 otherwise CMP normal, hemoglobin A1c 5.8  ASSESSMENT/PLAN:  Left tibial plateau fracture-status post surgical repair. continue immobilizer & rehabilitation Hypertension-blood pressure borderline. Will monitor. Hypertriglyceridemia-check fasting lipid panel GERD-continue PPI Constipation-well-controlled Check CBC  CPT CODE: 67619  Gayani Y Dasanayaka, MD [facility] [phone]

## 2013-10-17 ENCOUNTER — Non-Acute Institutional Stay (SKILLED_NURSING_FACILITY): Payer: Commercial Managed Care - HMO | Admitting: Internal Medicine

## 2013-10-17 DIAGNOSIS — I10 Essential (primary) hypertension: Secondary | ICD-10-CM

## 2013-10-17 DIAGNOSIS — K219 Gastro-esophageal reflux disease without esophagitis: Secondary | ICD-10-CM

## 2013-10-17 DIAGNOSIS — S82142S Displaced bicondylar fracture of left tibia, sequela: Secondary | ICD-10-CM

## 2013-10-17 DIAGNOSIS — S8290XS Unspecified fracture of unspecified lower leg, sequela: Secondary | ICD-10-CM

## 2013-10-17 DIAGNOSIS — E781 Pure hyperglyceridemia: Secondary | ICD-10-CM

## 2013-10-18 NOTE — Progress Notes (Signed)
         PROGRESS NOTE  DATE: 10-17-13  FACILITY: Nursing Home Location: [place]: SNF (31)  Discharge Visit  CHIEF COMPLAINT:  Manage left tibial plateau fracture, hypertension and hypertriglyceridemia  HISTORY OF PRESENT ILLNESS: I was requested by the social worker to perform face-to-face evaluation for discharge on 10-19-13.  REASSESSMENT OF ONGOING PROBLEM(S):  LEFT TIBIAL PLATEAU FX: Patient had a fall and sustained this fracture. He underwent surgical repair and currently receiving rehabilitation in this facility. His left lower extremity is in an immobilizer. He denies left knee pain. He is tolerating his pain medications without any side effects.  HTN: Pt 's HTN remains stable.  Denies CP, sob, DOE, pedal edema, headaches, dizziness or visual disturbances.  No complications from the medications currently being used.  Last BP : 146/84, 146/66  HYPERTRIGLYCERIDEMIA: Patient is currently on TriCor and tolerates it without any side effects. In 9-15 triglycerides 155, and LDL 112 otherwise fasting lipid panel normal  PAST MEDICAL HISTORY : Reviewed.  No changes/see problem list  CURRENT MEDICATIONS: Reviewed per MAR/see medication list  REVIEW OF SYSTEMS:  GENERAL: no change in appetite, no fatigue, no weight changes, no fever, chills or weakness RESPIRATORY: no cough, SOB, DOE, wheezing, hemoptysis CARDIAC: no chest pain, edema or palpitations GI: no abdominal pain, diarrhea, constipation, heart burn, nausea or vomiting  PHYSICAL EXAMINATION  VS:  See VS section  GENERAL: no acute distress, normal body habitus EYES: conjunctivae normal, sclerae normal, normal eye lids NECK: supple, trachea midline, no neck masses, no thyroid tenderness, no thyromegaly LYMPHATICS: no LAN in the neck, no supraclavicular LAN RESPIRATORY: breathing is even & unlabored, BS CTAB CARDIAC: RRR, no murmur,no extra heart sounds, no edema GI: abdomen soft,  normal BS, no masses, no tenderness, no hepatomegaly, no splenomegaly PSYCHIATRIC: the patient is alert & oriented to person, affect & behavior appropriate  LABS/RADIOLOGY: 9-15 CBC normal 8-15 glucose 127 otherwise CMP normal, hemoglobin A1c 5.8  ASSESSMENT/PLAN:  Left tibial plateau fracture-status post surgical repair. continue immobilizer & home health PT Hypertension-followup with primary M.D. Hypertriglyceridemia-adequately controlled GERD-continue PPI Constipation-well-controlled  I have written prescriptions and filled out discharge paper work Patient will receive home health PT, OT, ST and RN DME: 18" x 18"  wheel chair with drop arm rests,  With pressure-relief cushion, elevating leg rests, tub transfer bench, rolling walker with 5 inch wheels Time greater than 30 minutes which involved coordination of the discharge process with the social worker, physical therapy department nursing staff  CPT CODE: 62130  Cyara Devoto Y Gwynn Crossley, [place] [phone]

## 2014-02-18 DIAGNOSIS — D72829 Elevated white blood cell count, unspecified: Secondary | ICD-10-CM | POA: Diagnosis not present

## 2014-02-18 DIAGNOSIS — S72413A Displaced unspecified condyle fracture of lower end of unspecified femur, initial encounter for closed fracture: Secondary | ICD-10-CM | POA: Diagnosis not present

## 2014-03-21 DIAGNOSIS — D72829 Elevated white blood cell count, unspecified: Secondary | ICD-10-CM | POA: Diagnosis not present

## 2014-03-21 DIAGNOSIS — S72413A Displaced unspecified condyle fracture of lower end of unspecified femur, initial encounter for closed fracture: Secondary | ICD-10-CM | POA: Diagnosis not present

## 2014-04-19 DIAGNOSIS — D72829 Elevated white blood cell count, unspecified: Secondary | ICD-10-CM | POA: Diagnosis not present

## 2014-04-19 DIAGNOSIS — S72413A Displaced unspecified condyle fracture of lower end of unspecified femur, initial encounter for closed fracture: Secondary | ICD-10-CM | POA: Diagnosis not present

## 2014-05-20 DIAGNOSIS — D72829 Elevated white blood cell count, unspecified: Secondary | ICD-10-CM | POA: Diagnosis not present

## 2014-05-20 DIAGNOSIS — S72413A Displaced unspecified condyle fracture of lower end of unspecified femur, initial encounter for closed fracture: Secondary | ICD-10-CM | POA: Diagnosis not present

## 2014-06-19 DIAGNOSIS — D72829 Elevated white blood cell count, unspecified: Secondary | ICD-10-CM | POA: Diagnosis not present

## 2014-06-19 DIAGNOSIS — S72413A Displaced unspecified condyle fracture of lower end of unspecified femur, initial encounter for closed fracture: Secondary | ICD-10-CM | POA: Diagnosis not present

## 2014-07-11 DIAGNOSIS — H6122 Impacted cerumen, left ear: Secondary | ICD-10-CM | POA: Diagnosis not present

## 2014-07-20 DIAGNOSIS — D72829 Elevated white blood cell count, unspecified: Secondary | ICD-10-CM | POA: Diagnosis not present

## 2014-07-20 DIAGNOSIS — S72413A Displaced unspecified condyle fracture of lower end of unspecified femur, initial encounter for closed fracture: Secondary | ICD-10-CM | POA: Diagnosis not present

## 2014-08-01 DIAGNOSIS — Z1389 Encounter for screening for other disorder: Secondary | ICD-10-CM | POA: Diagnosis not present

## 2014-08-01 DIAGNOSIS — R269 Unspecified abnormalities of gait and mobility: Secondary | ICD-10-CM | POA: Diagnosis not present

## 2014-08-01 DIAGNOSIS — I10 Essential (primary) hypertension: Secondary | ICD-10-CM | POA: Diagnosis not present

## 2014-08-01 DIAGNOSIS — Z0000 Encounter for general adult medical examination without abnormal findings: Secondary | ICD-10-CM | POA: Diagnosis not present

## 2014-08-01 DIAGNOSIS — E781 Pure hyperglyceridemia: Secondary | ICD-10-CM | POA: Diagnosis not present

## 2014-08-01 DIAGNOSIS — K219 Gastro-esophageal reflux disease without esophagitis: Secondary | ICD-10-CM | POA: Diagnosis not present

## 2014-08-01 DIAGNOSIS — D496 Neoplasm of unspecified behavior of brain: Secondary | ICD-10-CM | POA: Diagnosis not present

## 2014-08-01 DIAGNOSIS — F17210 Nicotine dependence, cigarettes, uncomplicated: Secondary | ICD-10-CM | POA: Diagnosis not present

## 2014-08-19 DIAGNOSIS — D72829 Elevated white blood cell count, unspecified: Secondary | ICD-10-CM | POA: Diagnosis not present

## 2014-08-19 DIAGNOSIS — S72413A Displaced unspecified condyle fracture of lower end of unspecified femur, initial encounter for closed fracture: Secondary | ICD-10-CM | POA: Diagnosis not present

## 2014-09-19 DIAGNOSIS — S72413A Displaced unspecified condyle fracture of lower end of unspecified femur, initial encounter for closed fracture: Secondary | ICD-10-CM | POA: Diagnosis not present

## 2014-09-19 DIAGNOSIS — D72829 Elevated white blood cell count, unspecified: Secondary | ICD-10-CM | POA: Diagnosis not present

## 2014-10-20 DIAGNOSIS — S72413A Displaced unspecified condyle fracture of lower end of unspecified femur, initial encounter for closed fracture: Secondary | ICD-10-CM | POA: Diagnosis not present

## 2014-10-20 DIAGNOSIS — D72829 Elevated white blood cell count, unspecified: Secondary | ICD-10-CM | POA: Diagnosis not present

## 2014-12-13 DIAGNOSIS — H35372 Puckering of macula, left eye: Secondary | ICD-10-CM | POA: Diagnosis not present

## 2014-12-13 DIAGNOSIS — H2513 Age-related nuclear cataract, bilateral: Secondary | ICD-10-CM | POA: Diagnosis not present

## 2014-12-13 DIAGNOSIS — D3131 Benign neoplasm of right choroid: Secondary | ICD-10-CM | POA: Diagnosis not present

## 2014-12-13 DIAGNOSIS — H04213 Epiphora due to excess lacrimation, bilateral lacrimal glands: Secondary | ICD-10-CM | POA: Diagnosis not present

## 2014-12-13 DIAGNOSIS — H3509 Other intraretinal microvascular abnormalities: Secondary | ICD-10-CM | POA: Diagnosis not present

## 2014-12-13 DIAGNOSIS — H25013 Cortical age-related cataract, bilateral: Secondary | ICD-10-CM | POA: Diagnosis not present

## 2015-02-28 DIAGNOSIS — I10 Essential (primary) hypertension: Secondary | ICD-10-CM | POA: Diagnosis not present

## 2015-07-18 ENCOUNTER — Encounter (HOSPITAL_COMMUNITY): Payer: Medicare HMO

## 2015-07-18 ENCOUNTER — Other Ambulatory Visit: Payer: Self-pay | Admitting: Geriatric Medicine

## 2015-07-18 ENCOUNTER — Ambulatory Visit
Admission: RE | Admit: 2015-07-18 | Discharge: 2015-07-18 | Disposition: A | Discharge status: Home / Self Care | Payer: Commercial Managed Care - HMO | Source: Ambulatory Visit | Attending: Geriatric Medicine | Admitting: Geriatric Medicine

## 2015-07-18 DIAGNOSIS — R600 Localized edema: Secondary | ICD-10-CM

## 2015-07-18 DIAGNOSIS — R609 Edema, unspecified: Secondary | ICD-10-CM

## 2015-07-18 DIAGNOSIS — M79605 Pain in left leg: Secondary | ICD-10-CM | POA: Diagnosis not present

## 2015-07-18 DIAGNOSIS — I10 Essential (primary) hypertension: Secondary | ICD-10-CM | POA: Diagnosis not present

## 2015-07-18 DIAGNOSIS — L03116 Cellulitis of left lower limb: Secondary | ICD-10-CM | POA: Diagnosis not present

## 2015-08-08 DIAGNOSIS — Z1389 Encounter for screening for other disorder: Secondary | ICD-10-CM | POA: Diagnosis not present

## 2015-08-08 DIAGNOSIS — F17210 Nicotine dependence, cigarettes, uncomplicated: Secondary | ICD-10-CM | POA: Diagnosis not present

## 2015-08-08 DIAGNOSIS — D496 Neoplasm of unspecified behavior of brain: Secondary | ICD-10-CM | POA: Diagnosis not present

## 2015-08-08 DIAGNOSIS — R269 Unspecified abnormalities of gait and mobility: Secondary | ICD-10-CM | POA: Diagnosis not present

## 2015-08-08 DIAGNOSIS — K219 Gastro-esophageal reflux disease without esophagitis: Secondary | ICD-10-CM | POA: Diagnosis not present

## 2015-08-08 DIAGNOSIS — I10 Essential (primary) hypertension: Secondary | ICD-10-CM | POA: Diagnosis not present

## 2015-08-08 DIAGNOSIS — E781 Pure hyperglyceridemia: Secondary | ICD-10-CM | POA: Diagnosis not present

## 2015-08-08 DIAGNOSIS — Z0000 Encounter for general adult medical examination without abnormal findings: Secondary | ICD-10-CM | POA: Diagnosis not present

## 2015-08-08 DIAGNOSIS — Z79899 Other long term (current) drug therapy: Secondary | ICD-10-CM | POA: Diagnosis not present

## 2016-02-11 DIAGNOSIS — H04213 Epiphora due to excess lacrimation, bilateral lacrimal glands: Secondary | ICD-10-CM | POA: Diagnosis not present

## 2016-02-11 DIAGNOSIS — H35372 Puckering of macula, left eye: Secondary | ICD-10-CM | POA: Diagnosis not present

## 2016-02-11 DIAGNOSIS — H04123 Dry eye syndrome of bilateral lacrimal glands: Secondary | ICD-10-CM | POA: Diagnosis not present

## 2016-02-11 DIAGNOSIS — H2513 Age-related nuclear cataract, bilateral: Secondary | ICD-10-CM | POA: Diagnosis not present

## 2016-03-05 DIAGNOSIS — B9789 Other viral agents as the cause of diseases classified elsewhere: Secondary | ICD-10-CM | POA: Diagnosis not present

## 2016-03-05 DIAGNOSIS — J069 Acute upper respiratory infection, unspecified: Secondary | ICD-10-CM | POA: Diagnosis not present

## 2016-04-21 ENCOUNTER — Emergency Department (HOSPITAL_COMMUNITY): Payer: Medicare HMO

## 2016-04-21 ENCOUNTER — Encounter (HOSPITAL_COMMUNITY): Payer: Self-pay | Admitting: Family Medicine

## 2016-04-21 ENCOUNTER — Emergency Department (HOSPITAL_COMMUNITY)
Admission: EM | Admit: 2016-04-21 | Discharge: 2016-04-21 | Disposition: A | Discharge status: Home / Self Care | Payer: Medicare HMO | Attending: Emergency Medicine | Admitting: Emergency Medicine

## 2016-04-21 DIAGNOSIS — Z96642 Presence of left artificial hip joint: Secondary | ICD-10-CM | POA: Diagnosis not present

## 2016-04-21 DIAGNOSIS — Y999 Unspecified external cause status: Secondary | ICD-10-CM | POA: Diagnosis not present

## 2016-04-21 DIAGNOSIS — F17210 Nicotine dependence, cigarettes, uncomplicated: Secondary | ICD-10-CM | POA: Insufficient documentation

## 2016-04-21 DIAGNOSIS — I10 Essential (primary) hypertension: Secondary | ICD-10-CM | POA: Diagnosis not present

## 2016-04-21 DIAGNOSIS — S79922A Unspecified injury of left thigh, initial encounter: Secondary | ICD-10-CM | POA: Diagnosis present

## 2016-04-21 DIAGNOSIS — W050XXA Fall from non-moving wheelchair, initial encounter: Secondary | ICD-10-CM | POA: Insufficient documentation

## 2016-04-21 DIAGNOSIS — Y939 Activity, unspecified: Secondary | ICD-10-CM | POA: Insufficient documentation

## 2016-04-21 DIAGNOSIS — Z79899 Other long term (current) drug therapy: Secondary | ICD-10-CM | POA: Insufficient documentation

## 2016-04-21 DIAGNOSIS — S72112A Displaced fracture of greater trochanter of left femur, initial encounter for closed fracture: Secondary | ICD-10-CM | POA: Diagnosis not present

## 2016-04-21 DIAGNOSIS — S72142A Displaced intertrochanteric fracture of left femur, initial encounter for closed fracture: Secondary | ICD-10-CM | POA: Diagnosis not present

## 2016-04-21 DIAGNOSIS — Y929 Unspecified place or not applicable: Secondary | ICD-10-CM | POA: Insufficient documentation

## 2016-04-21 MED ORDER — FENTANYL CITRATE (PF) 100 MCG/2ML IJ SOLN
50.0000 ug | INTRAMUSCULAR | Status: AC | PRN
  Administered 2016-04-21 (×3): 50 ug via INTRAVENOUS
  Filled 2016-04-21 (×3): qty 2

## 2016-04-21 MED ORDER — OXYCODONE-ACETAMINOPHEN 5-325 MG PO TABS: 2.0000 | ORAL_TABLET | ORAL | 0 refills | Status: DC | PRN

## 2016-04-21 NOTE — ED Notes (Signed)
Patient transported to X-ray 

## 2016-04-21 NOTE — ED Triage Notes (Signed)
Patient is complaining of left hip pain. Pt reports he was in his wheelchair, went to lean down to grab a bag with the wheechair locked. Pt fell out of the chair on to his left hip. Pt has had a hip replacement. Also, he normally is able to bear weight on the hip but unable to now. Took Ibuprofen 1 tab about 16:00.

## 2016-04-21 NOTE — ED Provider Notes (Signed)
[facility] DEPT Provider Note   CSN: [id number] Arrival date & time: 04/21/16  2009     History   Chief Complaint Chief Complaint  Patient presents with  . Hip Pain    HPI Herbert Williams is a 51 y.o. male.  The history is provided by the patient.  Hip Pain  This is a new problem. The current episode started 3 to 5 hours ago (fall from wheelchair). The problem occurs constantly. The problem has not changed since onset.Pertinent negatives include no chest pain, no abdominal pain and no shortness of breath. The symptoms are aggravated by bending and twisting. Nothing relieves the symptoms. He has tried nothing for the symptoms.     Past Medical History:  Diagnosis Date  . Brain tumor ([place]) 1979  . 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 GERD (gastroesophageal reflux disease) 09/10/2013  . HTN (hypertension) 09/10/2013  . Hyperlipemia 09/10/2013    Patient Active Problem List   Diagnosis Date Noted  . Pure hyperglyceridemia 10/12/2013  . Unspecified constipation 09/17/2013  . Fracture, tibial plateau 09/10/2013  . 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 GERD (gastroesophageal reflux disease) 09/10/2013  . Hyperlipemia 09/10/2013  . HTN (hypertension) 09/10/2013  . Leukocytosis 09/10/2013  . Tibial plateau fracture, left 09/10/2013  . Fracture of femoral condyle, left, closed ([place]) 09/10/2013    Past Surgical History:  Procedure Laterality Date  . BRAIN SURGERY    . KNEE ARTHROSCOPY Left 09/11/2013   Procedure: LEFT ARTHROSCOPY WITH FIXATION OF FEMUR FX.;  Surgeon: Renette Butters, MD;  Location: [facility];  Service: Orthopedics;  Laterality: Left;  . PERCUTANEOUS PINNING Left 09/11/2013   Procedure: PERCUTANEOUS PINNING EXTREMITY;  Surgeon: Renette Butters, MD;  Location: [facility];  Service: Orthopedics;  Laterality: Left;  . TOTAL HIP ARTHROPLASTY Left        Home Medications     Prior to Admission medications   Medication Sig Start Date End Date Taking? Authorizing Provider  atenolol (TENORMIN) 50 MG tablet Take 50 mg by mouth daily.   Yes Historical Provider, MD  carboxymethylcellulose (REFRESH PLUS) 0.5 % SOLN 2 drops 4 (four) times daily as needed (dry eyes.).   Yes Historical Provider, MD  fenofibrate 160 MG tablet Take 160 mg by mouth daily.   Yes Historical Provider, MD  ibuprofen (ADVIL,MOTRIN) 200 MG tablet Take 200-400 mg by mouth every 6 (six) hours as needed (pain).   Yes Historical Provider, MD  lansoprazole (PREVACID) 30 MG capsule Take 30 mg by mouth daily at 12 noon.   Yes Historical Provider, MD  thiamine 100 MG tablet Take 1 tablet (100 mg total) by mouth daily. 09/13/13  Yes Reyne Dumas, MD  aspirin 81 MG tablet Take 1 tablet (81 mg total) by mouth daily. Patient not taking: Reported on 04/21/2016 09/11/13   Renette Butters, MD  enoxaparin (LOVENOX) 40 MG/0.4ML injection Inject 0.4 mLs (40 mg total) into the skin daily. Patient not taking: Reported on 04/21/2016 09/13/13   Reyne Dumas, MD  methocarbamol (ROBAXIN) 500 MG tablet Take 1 tablet (500 mg total) by mouth every 6 (six) hours as needed for muscle spasms. Patient not taking: Reported on 04/21/2016 09/13/13   Reyne Dumas, MD  nicotine (NICODERM CQ - DOSED IN MG/24 HOURS) 14 mg/24hr patch Place 1 patch (14 mg total) onto the skin daily. Patient not taking: Reported on 04/21/2016 09/13/13   Reyne Dumas, MD  ondansetron (ZOFRAN) 4 MG tablet Take 1 tablet (4 mg total) by mouth every 8 (eight) hours as needed for nausea or vomiting. Patient not taking: Reported on 04/21/2016 09/11/13   Renette Butters, MD    Family History History reviewed. No pertinent family history.  Social History Social History  Substance Use Topics  . Smoking status: Current Every Day Smoker    Packs/day: 0.50    Types: Cigarettes  . Smokeless tobacco: Never Used  . Alcohol use 12.6 oz/week    21 Cans of beer per week      Comment: Daily. 3-4 cans of beer a day. Last drink:Yesterday      Allergies   Penicillins   Review of Systems Review of Systems  Respiratory: Negative for shortness of breath.   Cardiovascular: Negative for chest pain.  Gastrointestinal: Negative for abdominal pain.  All other systems reviewed and are negative.    Physical Exam Updated Vital Signs BP 125/100 (BP Location: Left Arm)   Pulse 109   Temp 98.5 F (36.9 C) (Oral)   Resp 20   Ht 5\' 7"  (1.702 m)   Wt 188 lb (85.3 kg)   SpO2 95%   BMI 29.44 kg/m   Physical Exam  Constitutional: He is oriented to person, place, and time. He appears well-developed and well-nourished. No distress.  HENT:  Head: Normocephalic and atraumatic.  Nose: Nose normal.  Eyes: Conjunctivae are normal.  Neck: Neck supple. No tracheal deviation present.  Cardiovascular: Normal rate and regular rhythm.   Pulmonary/Chest: Effort normal. No respiratory distress.  Abdominal: Soft. He exhibits no distension.  Musculoskeletal:  Tenderness over left lateral hip with swelling over proximal thigh. NVI distal to injury, Pt unable to fully extend knee which he states is not a new finding and knee not painful  Neurological: He is alert and oriented to person, place, and time.  Skin: Skin is warm and dry.  Psychiatric: He has a normal mood and affect.     ED Treatments / Results  Labs (all labs ordered are listed, but only abnormal results are displayed) Labs Reviewed - No data to display  EKG  EKG Interpretation None       Radiology Dg Hip Unilat With Pelvis 2-3 Views Left  Result Date: 04/21/2016 CLINICAL DATA:  51 y/o M; left hip injury after fall. Unable to bear weight. EXAM: DG HIP (WITH OR WITHOUT PELVIS) 2-3V LEFT COMPARISON:  09/10/2013 left hip radiographs. FINDINGS: Left hip hemiarthroplasty. Mildly displaced acute fracture of the greater trochanter. No acute pelvic fracture is identified. 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 IMPRESSION: 1. Left hip hemiarthroplasty. Mildly displaced acute fracture of the greater trochanter. 2. 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 Electronically Signed   By: Kristine Garbe M.D.   On: 04/21/2016 21:21    Procedures Procedures (including critical care time)  Medications Ordered in ED Medications  fentaNYL (SUBLIMAZE) injection 50 mcg (50 mcg Intravenous Given 04/21/16 2338)     Initial Impression / Assessment and Plan / ED Course  I have reviewed the triage vital signs and the nursing notes.  Pertinent labs & imaging results that were available during my care of the patient were reviewed by me and considered in my medical decision making (see chart for details).     51 y.o. male presents with left greater trochanter fracture after fall from wheelchair today. Discussed with Dr Ninfa Linden of [facility] ortho who is o/c  for Dr Lorin Mercy who did remote arthroplasty and he states there is no acute surgical intervention indicated and Pt would be appropriate for pain control and OP f/u in clinic. Pain well controlled here. Will d/c on short course of percocet for pain and recommend close f/u with orthopedics. Plan to follow up with PCP as needed and return precautions discussed for worsening or new concerning symptoms.   Final Clinical Impressions(s) / ED Diagnoses   Final diagnoses:  Displaced fracture of greater trochanter of left femur, initial encounter for closed fracture [facility])    New Prescriptions New Prescriptions   No medications on file     Leo Grosser, MD 04/22/16 [phone]

## 2016-04-26 ENCOUNTER — Ambulatory Visit (INDEPENDENT_AMBULATORY_CARE_PROVIDER_SITE_OTHER): Payer: Commercial Managed Care - HMO | Admitting: Orthopaedic Surgery

## 2016-04-26 ENCOUNTER — Encounter (INDEPENDENT_AMBULATORY_CARE_PROVIDER_SITE_OTHER): Payer: Self-pay | Admitting: Orthopaedic Surgery

## 2016-04-26 VITALS — BP 121/77 | HR 57 | Ht 67.0 in | Wt 186.0 lb

## 2016-04-26 DIAGNOSIS — S72102A Unspecified trochanteric fracture of left femur, initial encounter for closed fracture: Secondary | ICD-10-CM

## 2016-04-26 DIAGNOSIS — M25552 Pain in left hip: Secondary | ICD-10-CM | POA: Diagnosis not present

## 2016-04-26 MED ORDER — OXYCODONE-ACETAMINOPHEN 5-325 MG PO TABS: 1.0000 | ORAL_TABLET | Freq: Four times a day (QID) | ORAL | 0 refills | Status: DC | PRN

## 2016-04-26 NOTE — Progress Notes (Signed)
Office Visit Note   Patient: Herbert Williams           Date of Birth: December 18, 1965           MRN: [id number] Visit Date: 04/26/2016              Requested by: Lajean Manes, MD [address] [place], [place] [address] PCP: Mathews Argyle, MD   Assessment & Plan: Visit Diagnoses:  1. 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  Plan: Patient has a greater trochanteric fracture which is a periprosthetic fracture after left hip hemiarthroplasty 2009. In the interim he's had other falls fell 2 years ago had day nondisplaced tibial plateau fracture also a left lateral condyle fracture that required screw fixation.  Follow-Up Instructions: Return in about 1 month (around 05/27/2016). Repeat x-rays on return visit AP and frog leg lateral left hip  Orders:  No orders of the defined types were placed in this encounter.  No orders of the defined types were placed in this encounter.     Procedures: No procedures performed   Clinical Data: No additional findings.   Subjective: Chief Complaint  Patient presents with  . Left Hip - Fracture    Patient presents with displaced fracture left greater trochanter. He was seen in the ED on 04/21/2016 and had x-rays made. He states that he was in his wheelchair, locked the brakes, and bent over to the left to pick something up from the floor. The wheelchair shot out from under him backwards and he fell directly onto his left hip. He is having a lot of pain. He was given Oxycodone in the ED and only has one tablet left. He states that they help some, but not a lot. He has a history of left hemiarthroplasty in 2009 by Dr. Lorin Mercy.   History of brain tumor surgery at about age 84. He ambulated some in a wheelchair in the past. Since 2009 is been using the wheelchair disc itself to transfer.  Review of Systems assistant  positive for brain tumor 1979. Choroid plexus papilloma resection 1978. Positive for GERD hypertension hyperlipidemia. Previous left tibial plateau fracture 2015 fracture lateral condyle treated with screw 2015.   Objective: Vital Signs: BP 121/77   Pulse (!) 57   Ht 5\' 7"  (1.702 m)   Wt 186 lb (84.4 kg)   BMI 29.13 kg/m   Physical Exam  Constitutional: He is oriented to person, place, and time. He appears well-developed and well-nourished.  HENT:  Head: Normocephalic and atraumatic.  Previous craniotomy scar.  Eyes: Pupils are equal, round, and reactive to light.  Left eye has good extraocular movement none. Right eye does not.  Neck: No tracheal deviation present. No thyromegaly present.  Cardiovascular: Normal rate.   Pulmonary/Chest: Effort normal. He has no wheezes.  Abdominal: Soft. Bowel sounds are normal.  Musculoskeletal:  Patients in wheelchairs some tenderness palpation of the left greater trochanter region. No pitting edema. Good capillary refill lower extremities. These do not reach full extension he has some spasticity  Neurological: He is alert and oriented to person, place, and time.  Skin: Skin is warm and dry. Capillary refill takes less than 2 seconds.  Psychiatric: He has a normal mood and affect. His behavior is normal. Judgment and thought content normal.    Ortho Exam  Specialty Comments:  No specialty comments available.  Imaging:  No results found.   PMFS History: Patient Active Problem List   Diagnosis Date Noted  . Pure hyperglyceridemia 10/12/2013  . Unspecified constipation 09/17/2013  . Fracture, tibial plateau 09/10/2013  . 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 GERD (gastroesophageal reflux disease) 09/10/2013  . Hyperlipemia 09/10/2013  . HTN (hypertension) 09/10/2013  . Leukocytosis 09/10/2013  . Tibial plateau fracture, left 09/10/2013  . Fracture of femoral condyle, left, closed ([place]) 09/10/2013   Past Medical  History:  Diagnosis Date  . Brain tumor ([place]) 1979  . 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 GERD (gastroesophageal reflux disease) 09/10/2013  . HTN (hypertension) 09/10/2013  . Hyperlipemia 09/10/2013    No family history on file.  Past Surgical History:  Procedure Laterality Date  . BRAIN SURGERY    . KNEE ARTHROSCOPY Left 09/11/2013   Procedure: LEFT ARTHROSCOPY WITH FIXATION OF FEMUR FX.;  Surgeon: Renette Butters, MD;  Location: [facility];  Service: Orthopedics;  Laterality: Left;  . PERCUTANEOUS PINNING Left 09/11/2013   Procedure: PERCUTANEOUS PINNING EXTREMITY;  Surgeon: Renette Butters, MD;  Location: [facility];  Service: Orthopedics;  Laterality: Left;  . TOTAL HIP ARTHROPLASTY Left    Social History   Occupational History  . Not on file.   Social History Main Topics  . Smoking status: Current Every Day Smoker    Packs/day: 0.50    Types: Cigarettes  . Smokeless tobacco: Never Used  . Alcohol use 12.6 oz/week    21 Cans of beer per week     Comment: Daily. 3-4 cans of beer a day. Last drink:Yesterday   . Drug use: No  . Sexual activity: Not on file

## 2016-05-03 ENCOUNTER — Telehealth (INDEPENDENT_AMBULATORY_CARE_PROVIDER_SITE_OTHER): Payer: Self-pay | Admitting: Radiology

## 2016-05-03 ENCOUNTER — Telehealth (INDEPENDENT_AMBULATORY_CARE_PROVIDER_SITE_OTHER): Payer: Self-pay | Admitting: Orthopaedic Surgery

## 2016-05-03 NOTE — Telephone Encounter (Signed)
Can you please advise since Dr. Yates is in surgery. 

## 2016-05-03 NOTE — Telephone Encounter (Signed)
Patients mother called asking for a refill on Oxycodone for her son. CB # [phone]

## 2016-05-03 NOTE — Telephone Encounter (Signed)
Patient's mother called earlier requesting refill of her son's Oxycodone. I sent message to Jeneen Rinks since you were in surgery, but he had already left clinic. Can you please advise? Thanks.

## 2016-05-04 MED ORDER — HYDROCODONE-ACETAMINOPHEN 5-325 MG PO TABS: ORAL_TABLET | ORAL | 0 refills | Status: DC

## 2016-05-04 NOTE — Addendum Note (Signed)
Addended by: Meyer Cory on: 05/04/2016 09:04 AM   Modules accepted: Orders

## 2016-05-04 NOTE — Telephone Encounter (Signed)
ucall. Print Rx for norco 5/325   # 20      One po q 8 hrs prn pain. . Thanks. Let him know we are weaning pain meds.

## 2016-05-04 NOTE — Telephone Encounter (Signed)
I called patient's mother and advised of change in med and that RX would be ready at front for pick up.

## 2016-05-12 ENCOUNTER — Telehealth (INDEPENDENT_AMBULATORY_CARE_PROVIDER_SITE_OTHER): Payer: Self-pay | Admitting: *Deleted

## 2016-05-12 NOTE — Telephone Encounter (Signed)
Patient's mother called in this morning in regards to her son needing a prescription refill on his Hydrocodone. Susan's CB # [phone]. Thank you

## 2016-05-12 NOTE — Telephone Encounter (Signed)
Please advise 

## 2016-05-12 NOTE — Telephone Encounter (Signed)
OK for refill # 20 tabs same sig as before thanks

## 2016-05-13 MED ORDER — HYDROCODONE-ACETAMINOPHEN 5-325 MG PO TABS
ORAL_TABLET | ORAL | 0 refills | Status: DC
Start: 2016-05-13 — End: 2020-02-29

## 2016-05-13 NOTE — Telephone Encounter (Signed)
Script written. I left voicemail for patient's mother advising we are in [place] office today and script will be available for pick up tomorrow morning in [place] office.

## 2016-06-01 ENCOUNTER — Ambulatory Visit (INDEPENDENT_AMBULATORY_CARE_PROVIDER_SITE_OTHER): Payer: Medicare HMO

## 2016-06-01 ENCOUNTER — Encounter (INDEPENDENT_AMBULATORY_CARE_PROVIDER_SITE_OTHER): Payer: Self-pay

## 2016-06-01 ENCOUNTER — Encounter (INDEPENDENT_AMBULATORY_CARE_PROVIDER_SITE_OTHER): Payer: Self-pay | Admitting: Orthopaedic Surgery

## 2016-06-01 ENCOUNTER — Ambulatory Visit (INDEPENDENT_AMBULATORY_CARE_PROVIDER_SITE_OTHER): Payer: Medicare HMO | Admitting: Orthopaedic Surgery

## 2016-06-01 VITALS — BP 107/64 | HR 54 | Ht 67.0 in | Wt 186.0 lb

## 2016-06-01 DIAGNOSIS — S72102D Unspecified trochanteric fracture of left femur, subsequent encounter for closed fracture with routine healing: Secondary | ICD-10-CM | POA: Diagnosis not present

## 2016-06-01 NOTE — Progress Notes (Signed)
Office Visit Note   Patient: Herbert Williams           Date of Birth: 10-02-1965           MRN: [id number] Visit Date: 06/01/2016              Requested by: Lajean Manes, MD [address] [place],  [address] PCP: Mathews Argyle, MD   Assessment & Plan: Visit Diagnoses:  1. Closed fracture of trochanter of left femur with routine healing, subsequent encounter     Plan: I discussed with patient and family that he needs uses walker in the house to decrease his chance of falling. His fracture is healed is no longer tender. He needs to increase his walking on a daily basis to keep a chart of the distance he walks and gradually increase the distance. We will order some home health physical therapy 3 times a week 4 weeks for ambulation with walker.  Follow-Up Instructions: No Follow-up on file.   Orders:  Orders Placed This Encounter  Procedures  . XR HIP UNILAT W OR W/O PELVIS 2-3 VIEWS LEFT   No orders of the defined types were placed in this encounter.     Procedures: No procedures performed   Clinical Data: No additional findings.   Subjective: Chief Complaint  Patient presents with  . Left Hip - Fracture, Follow-up    HPI patient returns for follow-up of greater trochanter fracture after hemiarthroplasty 2009. Fracture was nondisplaced. He gets up to the bathroom but has been hanging on to the walls. He's basically been in the wheelchair since last month of them when he gets up to the bathroom or gets up to get into bed. He has a walker at home he has a history of previous falls including tibial plateau fracture which required surgery fracture the femoral condyle left. Current greater trochanter fracture.  Review of Systems via systems updated and unchanged and as it pertains to his history of present illness.   Objective: Vital Signs: BP 107/64   Pulse (!) 54   Ht 5\' 7"  (1.702 m)   Wt 186 lb (84.4 kg)   BMI 29.13 kg/m   Physical  Exam  Constitutional: He is oriented to person, place, and time. He appears well-developed and well-nourished.  HENT:  Head: Normocephalic and atraumatic.  Eyes: EOM are normal. Pupils are equal, round, and reactive to light.  Neck: No tracheal deviation present. No thyromegaly present.  Cardiovascular: Normal rate.   Pulmonary/Chest: Effort normal. He has no wheezes.  Abdominal: Soft. Bowel sounds are normal.  Musculoskeletal:  Patient has the ability to stand as long as he hangs on with both hands he has sort of a spastic Deltoid type of positioning. He can always get his knees in full extension. He has no pain over the trochanter. No knee effusion is noted. X-rays demonstrate interval healing of the greater trochanter fracture which is now stable.  Neurological: He is alert and oriented to person, place, and time.  Skin: Skin is warm and dry. Capillary refill takes less than 2 seconds.  Psychiatric: He has a normal mood and affect. His behavior is normal. Judgment and thought content normal.    Ortho Exam  Specialty Comments:  No specialty comments available.  Imaging: Xr Hip Unilat W Or W/o Pelvis 2-3 Views Left  Result Date: 06/01/2016 AP lateral x-rays of left hip demonstrates callus formation at the nondisplaced fracture of the greater trochanter. Comparison x-rays to 04/21/2016  demonstrate interval healing.  impression: Status post hemiarthroplasty left hip with greater trochanteric fracture nondisplaced with interval healing    PMFS History: Patient Active Problem List   Diagnosis Date Noted  . Pure hyperglyceridemia 10/12/2013  . Unspecified constipation 09/17/2013  . Fracture, tibial plateau 09/10/2013  . 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 GERD (gastroesophageal reflux disease) 09/10/2013  . Hyperlipemia 09/10/2013  . HTN (hypertension) 09/10/2013  . Leukocytosis 09/10/2013  . Tibial plateau fracture, left 09/10/2013  . Fracture of femoral  condyle, left, closed ([place]) 09/10/2013   Past Medical History:  Diagnosis Date  . Brain tumor ([place]) 1979  . 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 GERD (gastroesophageal reflux disease) 09/10/2013  . HTN (hypertension) 09/10/2013  . Hyperlipemia 09/10/2013    No family history on file.  Past Surgical History:  Procedure Laterality Date  . BRAIN SURGERY    . KNEE ARTHROSCOPY Left 09/11/2013   Procedure: LEFT ARTHROSCOPY WITH FIXATION OF FEMUR FX.;  Surgeon: Renette Butters, MD;  Location: [facility];  Service: Orthopedics;  Laterality: Left;  . PERCUTANEOUS PINNING Left 09/11/2013   Procedure: PERCUTANEOUS PINNING EXTREMITY;  Surgeon: Renette Butters, MD;  Location: [facility];  Service: Orthopedics;  Laterality: Left;  . TOTAL HIP ARTHROPLASTY Left    Social History   Occupational History  . Not on file.   Social History Main Topics  . Smoking status: Current Every Day Smoker    Packs/day: 0.50    Types: Cigarettes  . Smokeless tobacco: Never Used  . Alcohol use 12.6 oz/week    21 Cans of beer per week     Comment: Daily. 3-4 cans of beer a day. Last drink:Yesterday   . Drug use: No  . Sexual activity: Not on file

## 2016-06-04 DIAGNOSIS — I10 Essential (primary) hypertension: Secondary | ICD-10-CM | POA: Diagnosis not present

## 2016-06-04 DIAGNOSIS — Z9181 History of falling: Secondary | ICD-10-CM | POA: Diagnosis not present

## 2016-06-04 DIAGNOSIS — K219 Gastro-esophageal reflux disease without esophagitis: Secondary | ICD-10-CM | POA: Diagnosis not present

## 2016-06-04 DIAGNOSIS — M25552 Pain in left hip: Secondary | ICD-10-CM | POA: Diagnosis not present

## 2016-06-04 DIAGNOSIS — F17210 Nicotine dependence, cigarettes, uncomplicated: Secondary | ICD-10-CM | POA: Diagnosis not present

## 2016-06-04 DIAGNOSIS — Z8781 Personal history of (healed) traumatic fracture: Secondary | ICD-10-CM | POA: Diagnosis not present

## 2016-06-04 DIAGNOSIS — M24562 Contracture, left knee: Secondary | ICD-10-CM | POA: Diagnosis not present

## 2016-06-07 ENCOUNTER — Telehealth (INDEPENDENT_AMBULATORY_CARE_PROVIDER_SITE_OTHER): Payer: Self-pay | Admitting: Orthopaedic Surgery

## 2016-06-07 DIAGNOSIS — F17210 Nicotine dependence, cigarettes, uncomplicated: Secondary | ICD-10-CM | POA: Diagnosis not present

## 2016-06-07 DIAGNOSIS — R269 Unspecified abnormalities of gait and mobility: Secondary | ICD-10-CM | POA: Diagnosis not present

## 2016-06-07 DIAGNOSIS — I10 Essential (primary) hypertension: Secondary | ICD-10-CM | POA: Diagnosis not present

## 2016-06-07 DIAGNOSIS — M25552 Pain in left hip: Secondary | ICD-10-CM | POA: Diagnosis not present

## 2016-06-07 DIAGNOSIS — S0990XA Unspecified injury of head, initial encounter: Secondary | ICD-10-CM | POA: Diagnosis not present

## 2016-06-07 DIAGNOSIS — Z8781 Personal history of (healed) traumatic fracture: Secondary | ICD-10-CM | POA: Diagnosis not present

## 2016-06-07 DIAGNOSIS — G8190 Hemiplegia, unspecified affecting unspecified side: Secondary | ICD-10-CM | POA: Diagnosis not present

## 2016-06-07 DIAGNOSIS — Z9181 History of falling: Secondary | ICD-10-CM | POA: Diagnosis not present

## 2016-06-07 DIAGNOSIS — S72012D Unspecified intracapsular fracture of left femur, subsequent encounter for closed fracture with routine healing: Secondary | ICD-10-CM | POA: Diagnosis not present

## 2016-06-07 DIAGNOSIS — M24562 Contracture, left knee: Secondary | ICD-10-CM | POA: Diagnosis not present

## 2016-06-07 DIAGNOSIS — K219 Gastro-esophageal reflux disease without esophagitis: Secondary | ICD-10-CM | POA: Diagnosis not present

## 2016-06-07 NOTE — Telephone Encounter (Signed)
ok 

## 2016-06-07 NOTE — Telephone Encounter (Signed)
I called [id number] and advised.

## 2016-06-07 NOTE — Telephone Encounter (Signed)
PJPETKK Herbert Williams (PT) with [facility] called needing verbal orders for (PT) 1 wk 1, 2 wk 4 and 1 wk 4. The number to contact Shaunda is [phone]

## 2016-06-07 NOTE — Telephone Encounter (Signed)
Ok for verbal orders ?

## 2016-06-09 DIAGNOSIS — K219 Gastro-esophageal reflux disease without esophagitis: Secondary | ICD-10-CM | POA: Diagnosis not present

## 2016-06-09 DIAGNOSIS — M24562 Contracture, left knee: Secondary | ICD-10-CM | POA: Diagnosis not present

## 2016-06-09 DIAGNOSIS — Z8781 Personal history of (healed) traumatic fracture: Secondary | ICD-10-CM | POA: Diagnosis not present

## 2016-06-09 DIAGNOSIS — M25552 Pain in left hip: Secondary | ICD-10-CM | POA: Diagnosis not present

## 2016-06-09 DIAGNOSIS — I10 Essential (primary) hypertension: Secondary | ICD-10-CM | POA: Diagnosis not present

## 2016-06-09 DIAGNOSIS — F17210 Nicotine dependence, cigarettes, uncomplicated: Secondary | ICD-10-CM | POA: Diagnosis not present

## 2016-06-09 DIAGNOSIS — Z9181 History of falling: Secondary | ICD-10-CM | POA: Diagnosis not present

## 2016-06-15 DIAGNOSIS — I10 Essential (primary) hypertension: Secondary | ICD-10-CM | POA: Diagnosis not present

## 2016-06-15 DIAGNOSIS — Z8781 Personal history of (healed) traumatic fracture: Secondary | ICD-10-CM | POA: Diagnosis not present

## 2016-06-15 DIAGNOSIS — M25552 Pain in left hip: Secondary | ICD-10-CM | POA: Diagnosis not present

## 2016-06-15 DIAGNOSIS — Z9181 History of falling: Secondary | ICD-10-CM | POA: Diagnosis not present

## 2016-06-15 DIAGNOSIS — K219 Gastro-esophageal reflux disease without esophagitis: Secondary | ICD-10-CM | POA: Diagnosis not present

## 2016-06-15 DIAGNOSIS — F17210 Nicotine dependence, cigarettes, uncomplicated: Secondary | ICD-10-CM | POA: Diagnosis not present

## 2016-06-15 DIAGNOSIS — M24562 Contracture, left knee: Secondary | ICD-10-CM | POA: Diagnosis not present

## 2016-06-16 DIAGNOSIS — R269 Unspecified abnormalities of gait and mobility: Secondary | ICD-10-CM | POA: Diagnosis not present

## 2016-06-16 DIAGNOSIS — M62838 Other muscle spasm: Secondary | ICD-10-CM | POA: Diagnosis not present

## 2016-06-16 DIAGNOSIS — I10 Essential (primary) hypertension: Secondary | ICD-10-CM | POA: Diagnosis not present

## 2016-06-17 DIAGNOSIS — F17210 Nicotine dependence, cigarettes, uncomplicated: Secondary | ICD-10-CM | POA: Diagnosis not present

## 2016-06-17 DIAGNOSIS — M24562 Contracture, left knee: Secondary | ICD-10-CM | POA: Diagnosis not present

## 2016-06-17 DIAGNOSIS — I10 Essential (primary) hypertension: Secondary | ICD-10-CM | POA: Diagnosis not present

## 2016-06-17 DIAGNOSIS — M25552 Pain in left hip: Secondary | ICD-10-CM | POA: Diagnosis not present

## 2016-06-17 DIAGNOSIS — Z9181 History of falling: Secondary | ICD-10-CM | POA: Diagnosis not present

## 2016-06-17 DIAGNOSIS — K219 Gastro-esophageal reflux disease without esophagitis: Secondary | ICD-10-CM | POA: Diagnosis not present

## 2016-06-17 DIAGNOSIS — Z8781 Personal history of (healed) traumatic fracture: Secondary | ICD-10-CM | POA: Diagnosis not present

## 2016-06-22 DIAGNOSIS — M24562 Contracture, left knee: Secondary | ICD-10-CM | POA: Diagnosis not present

## 2016-06-22 DIAGNOSIS — I10 Essential (primary) hypertension: Secondary | ICD-10-CM | POA: Diagnosis not present

## 2016-06-22 DIAGNOSIS — F17210 Nicotine dependence, cigarettes, uncomplicated: Secondary | ICD-10-CM | POA: Diagnosis not present

## 2016-06-22 DIAGNOSIS — Z9181 History of falling: Secondary | ICD-10-CM | POA: Diagnosis not present

## 2016-06-22 DIAGNOSIS — Z8781 Personal history of (healed) traumatic fracture: Secondary | ICD-10-CM | POA: Diagnosis not present

## 2016-06-22 DIAGNOSIS — K219 Gastro-esophageal reflux disease without esophagitis: Secondary | ICD-10-CM | POA: Diagnosis not present

## 2016-06-22 DIAGNOSIS — M25552 Pain in left hip: Secondary | ICD-10-CM | POA: Diagnosis not present

## 2016-06-25 DIAGNOSIS — Z9181 History of falling: Secondary | ICD-10-CM | POA: Diagnosis not present

## 2016-06-25 DIAGNOSIS — F17210 Nicotine dependence, cigarettes, uncomplicated: Secondary | ICD-10-CM | POA: Diagnosis not present

## 2016-06-25 DIAGNOSIS — M24562 Contracture, left knee: Secondary | ICD-10-CM | POA: Diagnosis not present

## 2016-06-25 DIAGNOSIS — K219 Gastro-esophageal reflux disease without esophagitis: Secondary | ICD-10-CM | POA: Diagnosis not present

## 2016-06-25 DIAGNOSIS — I10 Essential (primary) hypertension: Secondary | ICD-10-CM | POA: Diagnosis not present

## 2016-06-25 DIAGNOSIS — Z8781 Personal history of (healed) traumatic fracture: Secondary | ICD-10-CM | POA: Diagnosis not present

## 2016-06-25 DIAGNOSIS — M25552 Pain in left hip: Secondary | ICD-10-CM | POA: Diagnosis not present

## 2016-06-29 DIAGNOSIS — K219 Gastro-esophageal reflux disease without esophagitis: Secondary | ICD-10-CM | POA: Diagnosis not present

## 2016-06-29 DIAGNOSIS — M25552 Pain in left hip: Secondary | ICD-10-CM | POA: Diagnosis not present

## 2016-06-29 DIAGNOSIS — F17210 Nicotine dependence, cigarettes, uncomplicated: Secondary | ICD-10-CM | POA: Diagnosis not present

## 2016-06-29 DIAGNOSIS — Z8781 Personal history of (healed) traumatic fracture: Secondary | ICD-10-CM | POA: Diagnosis not present

## 2016-06-29 DIAGNOSIS — I10 Essential (primary) hypertension: Secondary | ICD-10-CM | POA: Diagnosis not present

## 2016-06-29 DIAGNOSIS — Z9181 History of falling: Secondary | ICD-10-CM | POA: Diagnosis not present

## 2016-06-29 DIAGNOSIS — M24562 Contracture, left knee: Secondary | ICD-10-CM | POA: Diagnosis not present

## 2016-07-01 DIAGNOSIS — M24562 Contracture, left knee: Secondary | ICD-10-CM | POA: Diagnosis not present

## 2016-07-01 DIAGNOSIS — Z9181 History of falling: Secondary | ICD-10-CM | POA: Diagnosis not present

## 2016-07-01 DIAGNOSIS — F17210 Nicotine dependence, cigarettes, uncomplicated: Secondary | ICD-10-CM | POA: Diagnosis not present

## 2016-07-01 DIAGNOSIS — K219 Gastro-esophageal reflux disease without esophagitis: Secondary | ICD-10-CM | POA: Diagnosis not present

## 2016-07-01 DIAGNOSIS — Z8781 Personal history of (healed) traumatic fracture: Secondary | ICD-10-CM | POA: Diagnosis not present

## 2016-07-01 DIAGNOSIS — I10 Essential (primary) hypertension: Secondary | ICD-10-CM | POA: Diagnosis not present

## 2016-07-01 DIAGNOSIS — M25552 Pain in left hip: Secondary | ICD-10-CM | POA: Diagnosis not present

## 2016-07-05 DIAGNOSIS — M24562 Contracture, left knee: Secondary | ICD-10-CM | POA: Diagnosis not present

## 2016-07-05 DIAGNOSIS — K219 Gastro-esophageal reflux disease without esophagitis: Secondary | ICD-10-CM | POA: Diagnosis not present

## 2016-07-05 DIAGNOSIS — Z8781 Personal history of (healed) traumatic fracture: Secondary | ICD-10-CM | POA: Diagnosis not present

## 2016-07-05 DIAGNOSIS — Z9181 History of falling: Secondary | ICD-10-CM | POA: Diagnosis not present

## 2016-07-05 DIAGNOSIS — M25552 Pain in left hip: Secondary | ICD-10-CM | POA: Diagnosis not present

## 2016-07-05 DIAGNOSIS — I10 Essential (primary) hypertension: Secondary | ICD-10-CM | POA: Diagnosis not present

## 2016-07-05 DIAGNOSIS — F17210 Nicotine dependence, cigarettes, uncomplicated: Secondary | ICD-10-CM | POA: Diagnosis not present

## 2016-07-13 DIAGNOSIS — Z8781 Personal history of (healed) traumatic fracture: Secondary | ICD-10-CM | POA: Diagnosis not present

## 2016-07-13 DIAGNOSIS — M25552 Pain in left hip: Secondary | ICD-10-CM | POA: Diagnosis not present

## 2016-07-13 DIAGNOSIS — M24562 Contracture, left knee: Secondary | ICD-10-CM | POA: Diagnosis not present

## 2016-07-13 DIAGNOSIS — F17210 Nicotine dependence, cigarettes, uncomplicated: Secondary | ICD-10-CM | POA: Diagnosis not present

## 2016-07-13 DIAGNOSIS — I10 Essential (primary) hypertension: Secondary | ICD-10-CM | POA: Diagnosis not present

## 2016-07-13 DIAGNOSIS — K219 Gastro-esophageal reflux disease without esophagitis: Secondary | ICD-10-CM | POA: Diagnosis not present

## 2016-07-13 DIAGNOSIS — Z9181 History of falling: Secondary | ICD-10-CM | POA: Diagnosis not present

## 2016-07-21 DIAGNOSIS — I10 Essential (primary) hypertension: Secondary | ICD-10-CM | POA: Diagnosis not present

## 2016-07-21 DIAGNOSIS — F17210 Nicotine dependence, cigarettes, uncomplicated: Secondary | ICD-10-CM | POA: Diagnosis not present

## 2016-07-21 DIAGNOSIS — M24562 Contracture, left knee: Secondary | ICD-10-CM | POA: Diagnosis not present

## 2016-07-21 DIAGNOSIS — Z9181 History of falling: Secondary | ICD-10-CM | POA: Diagnosis not present

## 2016-07-21 DIAGNOSIS — Z8781 Personal history of (healed) traumatic fracture: Secondary | ICD-10-CM | POA: Diagnosis not present

## 2016-07-21 DIAGNOSIS — K219 Gastro-esophageal reflux disease without esophagitis: Secondary | ICD-10-CM | POA: Diagnosis not present

## 2016-07-21 DIAGNOSIS — M25552 Pain in left hip: Secondary | ICD-10-CM | POA: Diagnosis not present

## 2016-07-30 DIAGNOSIS — F17210 Nicotine dependence, cigarettes, uncomplicated: Secondary | ICD-10-CM | POA: Diagnosis not present

## 2016-07-30 DIAGNOSIS — I10 Essential (primary) hypertension: Secondary | ICD-10-CM | POA: Diagnosis not present

## 2016-07-30 DIAGNOSIS — Z8781 Personal history of (healed) traumatic fracture: Secondary | ICD-10-CM | POA: Diagnosis not present

## 2016-07-30 DIAGNOSIS — M25552 Pain in left hip: Secondary | ICD-10-CM | POA: Diagnosis not present

## 2016-07-30 DIAGNOSIS — K219 Gastro-esophageal reflux disease without esophagitis: Secondary | ICD-10-CM | POA: Diagnosis not present

## 2016-07-30 DIAGNOSIS — M24562 Contracture, left knee: Secondary | ICD-10-CM | POA: Diagnosis not present

## 2016-07-30 DIAGNOSIS — Z9181 History of falling: Secondary | ICD-10-CM | POA: Diagnosis not present

## 2016-08-06 DIAGNOSIS — Z8781 Personal history of (healed) traumatic fracture: Secondary | ICD-10-CM | POA: Diagnosis not present

## 2016-08-06 DIAGNOSIS — M25552 Pain in left hip: Secondary | ICD-10-CM | POA: Diagnosis not present

## 2016-08-06 DIAGNOSIS — F17210 Nicotine dependence, cigarettes, uncomplicated: Secondary | ICD-10-CM | POA: Diagnosis not present

## 2016-08-06 DIAGNOSIS — M24562 Contracture, left knee: Secondary | ICD-10-CM | POA: Diagnosis not present

## 2016-08-06 DIAGNOSIS — Z9181 History of falling: Secondary | ICD-10-CM | POA: Diagnosis not present

## 2016-08-06 DIAGNOSIS — K219 Gastro-esophageal reflux disease without esophagitis: Secondary | ICD-10-CM | POA: Diagnosis not present

## 2016-08-06 DIAGNOSIS — I10 Essential (primary) hypertension: Secondary | ICD-10-CM | POA: Diagnosis not present

## 2016-08-10 DIAGNOSIS — Z9181 History of falling: Secondary | ICD-10-CM | POA: Diagnosis not present

## 2016-08-10 DIAGNOSIS — I10 Essential (primary) hypertension: Secondary | ICD-10-CM | POA: Diagnosis not present

## 2016-08-10 DIAGNOSIS — F17210 Nicotine dependence, cigarettes, uncomplicated: Secondary | ICD-10-CM | POA: Diagnosis not present

## 2016-08-10 DIAGNOSIS — K219 Gastro-esophageal reflux disease without esophagitis: Secondary | ICD-10-CM | POA: Diagnosis not present

## 2016-08-10 DIAGNOSIS — M24562 Contracture, left knee: Secondary | ICD-10-CM | POA: Diagnosis not present

## 2016-08-10 DIAGNOSIS — Z8781 Personal history of (healed) traumatic fracture: Secondary | ICD-10-CM | POA: Diagnosis not present

## 2016-08-10 DIAGNOSIS — M25552 Pain in left hip: Secondary | ICD-10-CM | POA: Diagnosis not present

## 2016-08-13 DIAGNOSIS — R269 Unspecified abnormalities of gait and mobility: Secondary | ICD-10-CM | POA: Diagnosis not present

## 2016-08-13 DIAGNOSIS — F17210 Nicotine dependence, cigarettes, uncomplicated: Secondary | ICD-10-CM | POA: Diagnosis not present

## 2016-08-13 DIAGNOSIS — Z79899 Other long term (current) drug therapy: Secondary | ICD-10-CM | POA: Diagnosis not present

## 2016-08-13 DIAGNOSIS — E781 Pure hyperglyceridemia: Secondary | ICD-10-CM | POA: Diagnosis not present

## 2016-08-13 DIAGNOSIS — K219 Gastro-esophageal reflux disease without esophagitis: Secondary | ICD-10-CM | POA: Diagnosis not present

## 2016-08-13 DIAGNOSIS — Z125 Encounter for screening for malignant neoplasm of prostate: Secondary | ICD-10-CM | POA: Diagnosis not present

## 2016-08-13 DIAGNOSIS — Z86011 Personal history of benign neoplasm of the brain: Secondary | ICD-10-CM | POA: Diagnosis not present

## 2016-08-13 DIAGNOSIS — Z0000 Encounter for general adult medical examination without abnormal findings: Secondary | ICD-10-CM | POA: Diagnosis not present

## 2016-08-13 DIAGNOSIS — I10 Essential (primary) hypertension: Secondary | ICD-10-CM | POA: Diagnosis not present

## 2016-08-17 DIAGNOSIS — M25552 Pain in left hip: Secondary | ICD-10-CM | POA: Diagnosis not present

## 2016-08-17 DIAGNOSIS — M24562 Contracture, left knee: Secondary | ICD-10-CM | POA: Diagnosis not present

## 2016-08-17 DIAGNOSIS — F17210 Nicotine dependence, cigarettes, uncomplicated: Secondary | ICD-10-CM | POA: Diagnosis not present

## 2016-08-17 DIAGNOSIS — I10 Essential (primary) hypertension: Secondary | ICD-10-CM | POA: Diagnosis not present

## 2016-08-17 DIAGNOSIS — Z8781 Personal history of (healed) traumatic fracture: Secondary | ICD-10-CM | POA: Diagnosis not present

## 2016-08-17 DIAGNOSIS — Z9181 History of falling: Secondary | ICD-10-CM | POA: Diagnosis not present

## 2016-08-17 DIAGNOSIS — K219 Gastro-esophageal reflux disease without esophagitis: Secondary | ICD-10-CM | POA: Diagnosis not present

## 2016-08-24 DIAGNOSIS — I10 Essential (primary) hypertension: Secondary | ICD-10-CM | POA: Diagnosis not present

## 2016-08-24 DIAGNOSIS — Z8781 Personal history of (healed) traumatic fracture: Secondary | ICD-10-CM | POA: Diagnosis not present

## 2016-08-24 DIAGNOSIS — Z9181 History of falling: Secondary | ICD-10-CM | POA: Diagnosis not present

## 2016-08-24 DIAGNOSIS — K219 Gastro-esophageal reflux disease without esophagitis: Secondary | ICD-10-CM | POA: Diagnosis not present

## 2016-08-24 DIAGNOSIS — F17210 Nicotine dependence, cigarettes, uncomplicated: Secondary | ICD-10-CM | POA: Diagnosis not present

## 2016-08-24 DIAGNOSIS — M24562 Contracture, left knee: Secondary | ICD-10-CM | POA: Diagnosis not present

## 2016-08-24 DIAGNOSIS — M25552 Pain in left hip: Secondary | ICD-10-CM | POA: Diagnosis not present

## 2016-08-31 DIAGNOSIS — K219 Gastro-esophageal reflux disease without esophagitis: Secondary | ICD-10-CM | POA: Diagnosis not present

## 2016-08-31 DIAGNOSIS — I10 Essential (primary) hypertension: Secondary | ICD-10-CM | POA: Diagnosis not present

## 2016-08-31 DIAGNOSIS — Z9181 History of falling: Secondary | ICD-10-CM | POA: Diagnosis not present

## 2016-08-31 DIAGNOSIS — Z8781 Personal history of (healed) traumatic fracture: Secondary | ICD-10-CM | POA: Diagnosis not present

## 2016-08-31 DIAGNOSIS — M24562 Contracture, left knee: Secondary | ICD-10-CM | POA: Diagnosis not present

## 2016-08-31 DIAGNOSIS — M25552 Pain in left hip: Secondary | ICD-10-CM | POA: Diagnosis not present

## 2016-08-31 DIAGNOSIS — F17210 Nicotine dependence, cigarettes, uncomplicated: Secondary | ICD-10-CM | POA: Diagnosis not present

## 2016-09-07 DIAGNOSIS — Z9181 History of falling: Secondary | ICD-10-CM | POA: Diagnosis not present

## 2016-09-07 DIAGNOSIS — M25552 Pain in left hip: Secondary | ICD-10-CM | POA: Diagnosis not present

## 2016-09-07 DIAGNOSIS — F17210 Nicotine dependence, cigarettes, uncomplicated: Secondary | ICD-10-CM | POA: Diagnosis not present

## 2016-09-07 DIAGNOSIS — M24562 Contracture, left knee: Secondary | ICD-10-CM | POA: Diagnosis not present

## 2016-09-07 DIAGNOSIS — I10 Essential (primary) hypertension: Secondary | ICD-10-CM | POA: Diagnosis not present

## 2016-09-07 DIAGNOSIS — K219 Gastro-esophageal reflux disease without esophagitis: Secondary | ICD-10-CM | POA: Diagnosis not present

## 2016-09-07 DIAGNOSIS — Z8781 Personal history of (healed) traumatic fracture: Secondary | ICD-10-CM | POA: Diagnosis not present

## 2017-03-09 DIAGNOSIS — D3131 Benign neoplasm of right choroid: Secondary | ICD-10-CM | POA: Diagnosis not present

## 2017-03-09 DIAGNOSIS — H04123 Dry eye syndrome of bilateral lacrimal glands: Secondary | ICD-10-CM | POA: Diagnosis not present

## 2017-03-09 DIAGNOSIS — H35372 Puckering of macula, left eye: Secondary | ICD-10-CM | POA: Diagnosis not present

## 2017-03-09 DIAGNOSIS — D3142 Benign neoplasm of left ciliary body: Secondary | ICD-10-CM | POA: Diagnosis not present

## 2017-03-21 DIAGNOSIS — E669 Obesity, unspecified: Secondary | ICD-10-CM | POA: Diagnosis not present

## 2017-03-21 DIAGNOSIS — I10 Essential (primary) hypertension: Secondary | ICD-10-CM | POA: Diagnosis not present

## 2017-03-21 DIAGNOSIS — Z6830 Body mass index (BMI) 30.0-30.9, adult: Secondary | ICD-10-CM | POA: Diagnosis not present

## 2017-03-24 ENCOUNTER — Other Ambulatory Visit: Payer: Self-pay | Admitting: *Deleted

## 2017-03-24 NOTE — Patient Outreach (Signed)
[facility]) Care Management  03/24/2017  [name] 23-Aug-1965 [id number]   Telephone screening  Referral Date: 03/21/2017 Referral source: Dr. Andria Frames Referral reason : Home safety evaluation and fall risk evaluation   Placed initial call to patient , patient answering phone reports patient is not available at this time, identified self as patient mother, Herbert Williams,  HIPAA information verified,  able to provide my contact number, name for return call, agreed that she would provided to patient.  Plan Will await return call, in no response plan return call in the next day.   Joylene Draft, RN, [place] Management Coordinator  [phone]- Mobile [phone]- Toll Free Main Office

## 2017-03-25 ENCOUNTER — Encounter: Payer: Self-pay | Admitting: *Deleted

## 2017-03-25 ENCOUNTER — Other Ambulatory Visit: Payer: Self-pay | Admitting: *Deleted

## 2017-03-25 NOTE — Patient Outreach (Signed)
[facility]) Care Management  03/25/2017  Herbert Williams 11/18/1965 [id number]   Telephone screening call #2  Unsuccessful telephone outreach, no answer able to leave a HIPAA compliant message requesting a return call.   Plan  Will await return call today, if no response will send unsuccessful outreach letter per workflow and schedule 10 day follow up call.    Joylene Draft, RN, [place] Management Coordinator  [phone]- Mobile [phone]- Toll Free Main Office

## 2017-04-08 ENCOUNTER — Other Ambulatory Visit: Payer: Self-pay | Admitting: *Deleted

## 2017-04-08 NOTE — Patient Outreach (Signed)
[facility]) Care Management  04/08/2017  Herbert Williams 1965/07/05 [id number]   Telephone screening 3rd Attempt  Unsuccessful telephone attempt x 3, no response from patient within 10 days of  outreach letter being sent.   Plan Will send CMA in basket message regarding case status, in active, unable establish contact with patient .  Will send MD case closure letter.    Joylene Draft, RN, [place] Management Coordinator  [phone]- Mobile [phone]- Toll Free Main Office

## 2017-11-04 DIAGNOSIS — I10 Essential (primary) hypertension: Secondary | ICD-10-CM | POA: Diagnosis not present

## 2017-11-04 DIAGNOSIS — Z1389 Encounter for screening for other disorder: Secondary | ICD-10-CM | POA: Diagnosis not present

## 2017-11-04 DIAGNOSIS — Z0000 Encounter for general adult medical examination without abnormal findings: Secondary | ICD-10-CM | POA: Diagnosis not present

## 2017-11-04 DIAGNOSIS — K219 Gastro-esophageal reflux disease without esophagitis: Secondary | ICD-10-CM | POA: Diagnosis not present

## 2017-11-04 DIAGNOSIS — Z79899 Other long term (current) drug therapy: Secondary | ICD-10-CM | POA: Diagnosis not present

## 2017-11-04 DIAGNOSIS — Z7189 Other specified counseling: Secondary | ICD-10-CM | POA: Diagnosis not present

## 2017-11-04 DIAGNOSIS — G8194 Hemiplegia, unspecified affecting left nondominant side: Secondary | ICD-10-CM | POA: Diagnosis not present

## 2017-11-04 DIAGNOSIS — K9089 Other intestinal malabsorption: Secondary | ICD-10-CM | POA: Diagnosis not present

## 2017-11-04 DIAGNOSIS — Z125 Encounter for screening for malignant neoplasm of prostate: Secondary | ICD-10-CM | POA: Diagnosis not present

## 2017-11-04 DIAGNOSIS — Z23 Encounter for immunization: Secondary | ICD-10-CM | POA: Diagnosis not present

## 2017-11-04 DIAGNOSIS — E781 Pure hyperglyceridemia: Secondary | ICD-10-CM | POA: Diagnosis not present

## 2017-11-04 DIAGNOSIS — R21 Rash and other nonspecific skin eruption: Secondary | ICD-10-CM | POA: Diagnosis not present

## 2018-02-10 DIAGNOSIS — E559 Vitamin D deficiency, unspecified: Secondary | ICD-10-CM | POA: Diagnosis not present

## 2018-03-15 DIAGNOSIS — D3131 Benign neoplasm of right choroid: Secondary | ICD-10-CM | POA: Diagnosis not present

## 2018-03-15 DIAGNOSIS — H25013 Cortical age-related cataract, bilateral: Secondary | ICD-10-CM | POA: Diagnosis not present

## 2018-03-15 DIAGNOSIS — H35372 Puckering of macula, left eye: Secondary | ICD-10-CM | POA: Diagnosis not present

## 2018-03-15 DIAGNOSIS — H2513 Age-related nuclear cataract, bilateral: Secondary | ICD-10-CM | POA: Diagnosis not present

## 2018-09-21 DIAGNOSIS — H25013 Cortical age-related cataract, bilateral: Secondary | ICD-10-CM | POA: Diagnosis not present

## 2018-09-21 DIAGNOSIS — H35372 Puckering of macula, left eye: Secondary | ICD-10-CM | POA: Diagnosis not present

## 2018-09-21 DIAGNOSIS — D3131 Benign neoplasm of right choroid: Secondary | ICD-10-CM | POA: Diagnosis not present

## 2018-09-21 DIAGNOSIS — H2512 Age-related nuclear cataract, left eye: Secondary | ICD-10-CM | POA: Diagnosis not present

## 2018-09-21 DIAGNOSIS — H25042 Posterior subcapsular polar age-related cataract, left eye: Secondary | ICD-10-CM | POA: Diagnosis not present

## 2018-09-21 DIAGNOSIS — H2513 Age-related nuclear cataract, bilateral: Secondary | ICD-10-CM | POA: Diagnosis not present

## 2018-09-21 DIAGNOSIS — H35033 Hypertensive retinopathy, bilateral: Secondary | ICD-10-CM | POA: Diagnosis not present

## 2019-03-08 DIAGNOSIS — Z1389 Encounter for screening for other disorder: Secondary | ICD-10-CM | POA: Diagnosis not present

## 2019-03-08 DIAGNOSIS — E559 Vitamin D deficiency, unspecified: Secondary | ICD-10-CM | POA: Diagnosis not present

## 2019-03-08 DIAGNOSIS — I10 Essential (primary) hypertension: Secondary | ICD-10-CM | POA: Diagnosis not present

## 2019-03-08 DIAGNOSIS — Z125 Encounter for screening for malignant neoplasm of prostate: Secondary | ICD-10-CM | POA: Diagnosis not present

## 2019-03-08 DIAGNOSIS — F17210 Nicotine dependence, cigarettes, uncomplicated: Secondary | ICD-10-CM | POA: Diagnosis not present

## 2019-03-08 DIAGNOSIS — Z23 Encounter for immunization: Secondary | ICD-10-CM | POA: Diagnosis not present

## 2019-03-08 DIAGNOSIS — Z0000 Encounter for general adult medical examination without abnormal findings: Secondary | ICD-10-CM | POA: Diagnosis not present

## 2019-03-08 DIAGNOSIS — K9089 Other intestinal malabsorption: Secondary | ICD-10-CM | POA: Diagnosis not present

## 2019-03-08 DIAGNOSIS — Z7289 Other problems related to lifestyle: Secondary | ICD-10-CM | POA: Diagnosis not present

## 2019-03-08 DIAGNOSIS — K219 Gastro-esophageal reflux disease without esophagitis: Secondary | ICD-10-CM | POA: Diagnosis not present

## 2019-03-08 DIAGNOSIS — Z79899 Other long term (current) drug therapy: Secondary | ICD-10-CM | POA: Diagnosis not present

## 2019-03-08 DIAGNOSIS — E781 Pure hyperglyceridemia: Secondary | ICD-10-CM | POA: Diagnosis not present

## 2019-03-08 DIAGNOSIS — R269 Unspecified abnormalities of gait and mobility: Secondary | ICD-10-CM | POA: Diagnosis not present

## 2019-04-02 DIAGNOSIS — H2511 Age-related nuclear cataract, right eye: Secondary | ICD-10-CM | POA: Diagnosis not present

## 2019-04-02 DIAGNOSIS — Z79899 Other long term (current) drug therapy: Secondary | ICD-10-CM | POA: Diagnosis not present

## 2019-04-02 DIAGNOSIS — H25812 Combined forms of age-related cataract, left eye: Secondary | ICD-10-CM | POA: Diagnosis not present

## 2019-04-02 DIAGNOSIS — H2513 Age-related nuclear cataract, bilateral: Secondary | ICD-10-CM | POA: Diagnosis not present

## 2019-05-24 DIAGNOSIS — Z20822 Contact with and (suspected) exposure to covid-19: Secondary | ICD-10-CM | POA: Diagnosis not present

## 2019-05-24 DIAGNOSIS — Z9189 Other specified personal risk factors, not elsewhere classified: Secondary | ICD-10-CM | POA: Diagnosis not present

## 2019-05-29 DIAGNOSIS — R270 Ataxia, unspecified: Secondary | ICD-10-CM | POA: Diagnosis not present

## 2019-05-29 DIAGNOSIS — Z86011 Personal history of benign neoplasm of the brain: Secondary | ICD-10-CM | POA: Diagnosis not present

## 2019-05-29 DIAGNOSIS — E785 Hyperlipidemia, unspecified: Secondary | ICD-10-CM | POA: Diagnosis not present

## 2019-05-29 DIAGNOSIS — F17210 Nicotine dependence, cigarettes, uncomplicated: Secondary | ICD-10-CM | POA: Diagnosis not present

## 2019-05-29 DIAGNOSIS — K219 Gastro-esophageal reflux disease without esophagitis: Secondary | ICD-10-CM | POA: Diagnosis not present

## 2019-05-29 DIAGNOSIS — R201 Hypoesthesia of skin: Secondary | ICD-10-CM | POA: Diagnosis not present

## 2019-05-29 DIAGNOSIS — H25812 Combined forms of age-related cataract, left eye: Secondary | ICD-10-CM | POA: Diagnosis not present

## 2019-05-29 DIAGNOSIS — I10 Essential (primary) hypertension: Secondary | ICD-10-CM | POA: Diagnosis not present

## 2019-05-29 DIAGNOSIS — Z79899 Other long term (current) drug therapy: Secondary | ICD-10-CM | POA: Diagnosis not present

## 2019-05-30 DIAGNOSIS — Z961 Presence of intraocular lens: Secondary | ICD-10-CM | POA: Diagnosis not present

## 2019-05-30 DIAGNOSIS — Z48810 Encounter for surgical aftercare following surgery on the sense organs: Secondary | ICD-10-CM | POA: Diagnosis not present

## 2019-05-30 DIAGNOSIS — Z79899 Other long term (current) drug therapy: Secondary | ICD-10-CM | POA: Diagnosis not present

## 2019-06-06 DIAGNOSIS — Z7952 Long term (current) use of systemic steroids: Secondary | ICD-10-CM | POA: Diagnosis not present

## 2019-06-06 DIAGNOSIS — Z961 Presence of intraocular lens: Secondary | ICD-10-CM | POA: Diagnosis not present

## 2019-06-06 DIAGNOSIS — Z48810 Encounter for surgical aftercare following surgery on the sense organs: Secondary | ICD-10-CM | POA: Diagnosis not present

## 2019-09-06 DIAGNOSIS — I10 Essential (primary) hypertension: Secondary | ICD-10-CM | POA: Diagnosis not present

## 2019-09-06 DIAGNOSIS — F17210 Nicotine dependence, cigarettes, uncomplicated: Secondary | ICD-10-CM | POA: Diagnosis not present

## 2019-09-06 DIAGNOSIS — Z23 Encounter for immunization: Secondary | ICD-10-CM | POA: Diagnosis not present

## 2019-09-06 DIAGNOSIS — E781 Pure hyperglyceridemia: Secondary | ICD-10-CM | POA: Diagnosis not present

## 2019-09-06 DIAGNOSIS — G8250 Quadriplegia, unspecified: Secondary | ICD-10-CM | POA: Diagnosis not present

## 2019-09-27 DIAGNOSIS — I10 Essential (primary) hypertension: Secondary | ICD-10-CM | POA: Diagnosis not present

## 2020-01-10 DIAGNOSIS — H2511 Age-related nuclear cataract, right eye: Secondary | ICD-10-CM | POA: Diagnosis not present

## 2020-01-10 DIAGNOSIS — H35372 Puckering of macula, left eye: Secondary | ICD-10-CM | POA: Diagnosis not present

## 2020-01-10 DIAGNOSIS — D3131 Benign neoplasm of right choroid: Secondary | ICD-10-CM | POA: Diagnosis not present

## 2020-01-10 DIAGNOSIS — H31092 Other chorioretinal scars, left eye: Secondary | ICD-10-CM | POA: Diagnosis not present

## 2020-01-10 DIAGNOSIS — H25011 Cortical age-related cataract, right eye: Secondary | ICD-10-CM | POA: Diagnosis not present

## 2020-02-29 ENCOUNTER — Encounter (HOSPITAL_COMMUNITY): Payer: Self-pay

## 2020-02-29 ENCOUNTER — Other Ambulatory Visit: Payer: Self-pay

## 2020-02-29 ENCOUNTER — Emergency Department (HOSPITAL_COMMUNITY): Payer: Medicare HMO

## 2020-02-29 ENCOUNTER — Emergency Department (HOSPITAL_COMMUNITY)
Admission: EM | Admit: 2020-02-29 | Discharge: 2020-02-29 | Disposition: A | Discharge status: Home / Self Care | Payer: Medicare HMO | Attending: Emergency Medicine | Admitting: Emergency Medicine

## 2020-02-29 DIAGNOSIS — Y92002 Bathroom of unspecified non-institutional (private) residence single-family (private) house as the place of occurrence of the external cause: Secondary | ICD-10-CM | POA: Insufficient documentation

## 2020-02-29 DIAGNOSIS — Z96642 Presence of left artificial hip joint: Secondary | ICD-10-CM | POA: Insufficient documentation

## 2020-02-29 DIAGNOSIS — I10 Essential (primary) hypertension: Secondary | ICD-10-CM | POA: Insufficient documentation

## 2020-02-29 DIAGNOSIS — Z79899 Other long term (current) drug therapy: Secondary | ICD-10-CM | POA: Insufficient documentation

## 2020-02-29 DIAGNOSIS — W19XXXA Unspecified fall, initial encounter: Secondary | ICD-10-CM

## 2020-02-29 DIAGNOSIS — F17210 Nicotine dependence, cigarettes, uncomplicated: Secondary | ICD-10-CM | POA: Insufficient documentation

## 2020-02-29 DIAGNOSIS — M25552 Pain in left hip: Secondary | ICD-10-CM | POA: Diagnosis not present

## 2020-02-29 DIAGNOSIS — W1811XA Fall from or off toilet without subsequent striking against object, initial encounter: Secondary | ICD-10-CM | POA: Diagnosis not present

## 2020-02-29 MED ORDER — HYDROCODONE-ACETAMINOPHEN 5-325 MG PO TABS: 1.0000 | ORAL_TABLET | Freq: Four times a day (QID) | ORAL | 0 refills | Status: DC | PRN

## 2020-02-29 MED ORDER — HYDROCODONE-ACETAMINOPHEN 5-325 MG PO TABS
1.0000 | ORAL_TABLET | Freq: Once | ORAL | Status: AC
  Administered 2020-02-29: 1 via ORAL
  Filled 2020-02-29: qty 1

## 2020-02-29 NOTE — ED Notes (Signed)
Pt transported to CT at this time.

## 2020-02-29 NOTE — ED Notes (Signed)
Pt assisted into WC and assisted to family's vehicle. Pt, pts mother, and patients sister all updated on plan of care, follow up and DC instructions. Pt and pts family informed that home health would be reaching out to set up assistance. Pts mother and sister verbalizes understanding of DC and follow up.

## 2020-02-29 NOTE — Discharge Instructions (Addendum)
At this time there does not appear to be the presence of an emergent medical condition, however there is always the potential for conditions to change. Please read and follow the below instructions.  Please return to the Emergency Department immediately for any new or worsening symptoms. Please be sure to follow up with your Primary Care Provider within one week regarding your visit today; please call their office to schedule an appointment even if you are feeling better for a follow-up visit. You may take the medication Norco (Hydrocodone/Acetaminophen) as prescribed to help with severe pain.  This medication will make you drowsy so do not drive, drink alcohol, take other sedating medications or perform any dangerous activities such as driving after taking Norco. Norco contains Tylenol (acetaminophen) so do not take any other Tylenol-containing products with Norco. Please call the specialist at [facility] orthopedic specialist for follow-up appointment for reevaluation and treatment of your left hip pain.  As we discussed you may need follow-up imaging if your symptoms do not improve to assess for unseen fractures. You have been referred to the social worker and home health specialist they should reach out to you in the next few days with recommendations for future assistance.  Go to the nearest Emergency Department immediately if: You have fever or chills You fall. You have a sudden increase in pain and swelling in your hip. Your hip is red or swollen or very tender to touch. You have: Loss of feeling (numbness), tingling, or weakness in your arms or legs. Very bad neck pain, especially tenderness in the middle of the back of your neck. A change in your ability to control your pee or poop (stool). More pain in any area of your body. Swelling in any area of your body, especially your legs. Shortness of breath or light-headedness. Chest pain. Blood in your pee, poop, or vomit. Very bad pain  in your belly (abdomen) or your back. Very bad headaches or headaches that are getting worse. Sudden vision loss or double vision. Your eye suddenly turns red. The black center of your eye (pupil) is an odd shape or size. You have any new/concerning or worsening of symptoms   Please read the additional information packets attached to your discharge summary.  Do not take your medicine if  develop an itchy rash, swelling in your mouth or lips, or difficulty breathing; call 911 and seek immediate emergency medical attention if this occurs.  You may review your lab tests and imaging results in their entirety on your MyChart account.  Please discuss all results of fully with your primary care provider and other specialist at your follow-up visit.  Note: Portions of this text may have been transcribed using voice recognition software. Every effort was made to ensure accuracy; however, inadvertent computerized transcription errors may still be present.

## 2020-02-29 NOTE — Care Management CHF Note (Signed)
ED CM reviewed record and noted recommendation for [facility] services.  Patient lives at home with mother. Patient has [employer] Medicare, CM will send in Referral to [facility] for [facility] services,

## 2020-02-29 NOTE — Progress Notes (Signed)
   02/29/20 1737  TOC ED Mini Assessment  TOC Time spent with patient (minutes): 20  PING Used in TOC Assessment No  Admission or Readmission Diverted Yes  Interventions which prevented an admission or readmission [place] or Services  What brought you to the Emergency Department?  presented with c/o hip pains  Barrier interventions Patient was evaluated in the ED no significant findings HH recommendation. CM contacted [facility] who has accepted the referral

## 2020-02-29 NOTE — ED Provider Notes (Signed)
[facility] DEPT Provider Note   CSN: [id number] Arrival date & time: 02/29/20  1020     History Chief Complaint  Patient presents with  . Hip Pain  . Fall    Herbert Williams is a 55 y.o. male history of brain tumor status postresection, baseline tremor, GERD, hypertension, hyperlipidemia, wheelchair dependent.  Patient reports that 2 weeks ago he was sitting on the toilet when he had a strong sneeze which caused him to fall off the left-sided toilet and onto his left hip.  He reports immediate onset left hip pain which has been moderate intensity constant nonradiating worsened with movement and palpation without alleviating factors.  He reports that since his fall he has not been able to use his wheelchair and he has been bedbound.  Denies head injury, loss consciousness, headache, blood thinner use, neck pain, back pain, chest pain, abdominal pain, numbness/tingling, weakness, pain of the upper extremities, pain of the right lower extremity or any additional concerns   HPI     Past Medical History:  Diagnosis Date  . Brain tumor ([place]) 1979  . 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 GERD (gastroesophageal reflux disease) 09/10/2013  . HTN (hypertension) 09/10/2013  . Hyperlipemia 09/10/2013    Patient Active Problem List   Diagnosis Date Noted  . Pure hyperglyceridemia 10/12/2013  . Unspecified constipation 09/17/2013  . Fracture, tibial plateau 09/10/2013  . 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 GERD (gastroesophageal reflux disease) 09/10/2013  . Hyperlipemia 09/10/2013  . HTN (hypertension) 09/10/2013  . Leukocytosis 09/10/2013  . Tibial plateau fracture, left 09/10/2013  . Fracture of femoral condyle, left, closed ([place]) 09/10/2013    Past Surgical History:  Procedure Laterality Date  . BRAIN SURGERY    . KNEE ARTHROSCOPY Left 09/11/2013    Procedure: LEFT ARTHROSCOPY WITH FIXATION OF FEMUR FX.;  Surgeon: Renette Butters, MD;  Location: [place];  Service: Orthopedics;  Laterality: Left;  . PERCUTANEOUS PINNING Left 09/11/2013   Procedure: PERCUTANEOUS PINNING EXTREMITY;  Surgeon: Renette Butters, MD;  Location: [place];  Service: Orthopedics;  Laterality: Left;  . TOTAL HIP ARTHROPLASTY Left        History reviewed. No pertinent family history.  Social History   Tobacco Use  . Smoking status: Current Every Day Smoker    Packs/day: 0.50    Types: Cigarettes  . Smokeless tobacco: Never Used  Vaping Use  . Vaping Use: Never used  Substance Use Topics  . Alcohol use: Yes    Comment: daily 3-4 cans beer  . Drug use: No    Home Medications Prior to Admission medications   Medication Sig Start Date End Date Taking? Authorizing Provider  atenolol (TENORMIN) 50 MG tablet Take 50 mg by mouth daily.   Yes [provider]  calcium carbonate (TUMS - DOSED IN MG ELEMENTAL CALCIUM) 500 MG chewable tablet Chew 500 mg by mouth 3 (three) times daily as needed for indigestion or heartburn.   Yes [provider]  carboxymethylcellulose (REFRESH PLUS) 0.5 % SOLN 2 drops 4 (four) times daily as needed (dry eyes.).   Yes [provider]  fenofibrate 160 MG tablet Take 160 mg by mouth daily.   Yes [provider]  fluticasone (FLONASE) 50 MCG/ACT nasal spray Place 1-2 sprays into both nostrils daily as needed for allergies or rhinitis.   Yes [provider]  HYDROcodone-acetaminophen (NORCO/VICODIN)  5-325 MG tablet Take 1 tablet by mouth every 6 (six) hours as needed. 02/29/20  Yes Nuala Alpha A, PA-C  omeprazole (PRILOSEC) 20 MG capsule Take 20 mg by mouth daily.   Yes [provider]  vitamin B-12 (CYANOCOBALAMIN) 1000 MCG tablet Take 1,000 mcg by mouth daily.   Yes [provider]  Vitamin D, Ergocalciferol, (DRISDOL) 1.25 MG (50000 UNIT) CAPS capsule Take 50,000 Units by  mouth once a week. 01/01/20  Yes [provider]  aspirin 81 MG tablet Take 1 tablet (81 mg total) by mouth daily. Patient not taking: No sig reported 09/11/13   Renette Butters, MD  enoxaparin (LOVENOX) 40 MG/0.4ML injection Inject 0.4 mLs (40 mg total) into the skin daily. Patient not taking: No sig reported 09/13/13   Reyne Dumas, MD  thiamine 100 MG tablet Take 1 tablet (100 mg total) by mouth daily. Patient not taking: Reported on 02/29/2020 09/13/13   Reyne Dumas, MD    Allergies    Pedi-pre tape spray [wound dressing adhesive] and Penicillins  Review of Systems   Review of Systems Ten systems are reviewed and are negative for acute change except as noted in the HPI  Physical Exam Updated Vital Signs BP (!) 135/93 (BP Location: Left Arm)   Pulse 71   Temp 97.8 F (36.6 C) (Oral)   Resp 16   Ht 5\' 7"  (1.702 m)   Wt 81.6 kg   SpO2 95%   BMI 28.19 kg/m   Physical Exam Constitutional:      General: He is not in acute distress.    Appearance: Normal appearance. He is well-developed. He is not ill-appearing or diaphoretic.  HENT:     Head: Normocephalic and atraumatic. No raccoon eyes, Battle's sign, abrasion or contusion.     Jaw: There is normal jaw occlusion. No trismus.     Right Ear: External ear normal.     Left Ear: External ear normal.     Nose: Nose normal.     Right Nostril: No epistaxis.     Left Nostril: No epistaxis.     Mouth/Throat:     Mouth: Mucous membranes are moist.     Pharynx: Oropharynx is clear. Uvula midline.  Eyes:     General: Vision grossly intact. Gaze aligned appropriately.     Extraocular Movements: Extraocular movements intact.     Pupils: Pupils are equal, round, and reactive to light.  Neck:     Trachea: Trachea and phonation normal.  Cardiovascular:     Rate and Rhythm: Normal rate and regular rhythm.     Pulses:          Dorsalis pedis pulses are 2+ on the right side and 2+ on the left side.  Pulmonary:     Effort:  Pulmonary effort is normal. No respiratory distress.     Breath sounds: Normal breath sounds and air entry.  Chest:     Chest wall: No deformity or tenderness.  Abdominal:     General: There is no distension.     Palpations: Abdomen is soft.     Tenderness: There is no abdominal tenderness. There is no guarding or rebound.  Musculoskeletal:        General: Normal range of motion.     Cervical back: Normal range of motion. No spinous process tenderness or muscular tenderness.     Comments: No midline C/T/L spinal tenderness to palpation, no paraspinal muscle tenderness, no deformity, crepitus, or step-off noted. No sign  of injury to the neck or back.  Bony pelvis stable to compression without pain.  Tenderness over left lateral hip without overlying skin changes.  Well-healed surgical scar.  Contractures of the bilateral lower extremities.  No long bone tenderness or deformity.  Knees ankles toes and feet palpated without pain or deformity.  Appropriate range of motion and strength for condition of the upper extremities without pain or deformity  Skin:    General: Skin is warm and dry.  Neurological:     Mental Status: He is alert.     GCS: GCS eye subscore is 4. GCS verbal subscore is 5. GCS motor subscore is 6.     Comments: Speech is stuttering and goal oriented, follows commands baseline per family  Psychiatric:        Behavior: Behavior normal.     ED Results / Procedures / Treatments   Labs (all labs ordered are listed, but only abnormal results are displayed) Labs Reviewed - No data to display  EKG None  Radiology CT Hip Left Wo Contrast  Result Date: 02/29/2020 CLINICAL DATA:  Left hip pain. Fell 2 weeks ago. Significant contraction deformities. EXAM: CT OF THE LEFT HIP WITHOUT CONTRAST TECHNIQUE: Multidetector CT imaging of the left hip was performed according to the standard protocol. Multiplanar CT image reconstructions were also generated. COMPARISON:  Radiographs, same  date. FINDINGS: Examination is quite limited due to the left hip contracture and metal artifact. No obvious periprosthetic hip fracture is identified. No acetabular fracture. The visualized bony pelvis is intact. IMPRESSION: 1. Very limited examination due to the left hip contracture and metal artifact. 2. No obvious periprosthetic hip fracture is identified. Electronically Signed   By: Marijo Sanes M.D.   On: 02/29/2020 14:42   DG Hip Unilat With Pelvis 2-3 Views Left  Result Date: 02/29/2020 CLINICAL DATA:  Left hip pain after fall. EXAM: DG HIP (WITH OR WITHOUT PELVIS) 2-3V LEFT COMPARISON:  June 01, 2016. FINDINGS: Status post left hip arthroplasty. Large amount of heterotopic bone formation is seen around the trochanteric region. Possible mildly displaced fracture involving a portion of this bone formation is noted of indeterminate age. Severe degenerative changes seen involving the right hip with probable avascular necrosis involving the right femoral head. 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 Status post left hip arthroplasty. Large amount of heterotopic bone formation is noted around the left trochanteric region, with possible mildly displaced fracture involving a portion of this bone formation of indeterminate age. Electronically Signed   By: Marijo Conception M.D.   On: 02/29/2020 12:06    Procedures Procedures (including critical care time)  Medications Ordered in ED Medications  HYDROcodone-acetaminophen (NORCO/VICODIN) 5-325 MG per tablet 1 tablet (1 tablet Oral Given 02/29/20 1308)    ED Course  I have reviewed the triage vital signs and the nursing notes.  Pertinent labs & imaging results that were available during my care of the patient were reviewed by me and considered in my medical decision making (see chart for details).  Clinical Course as of 02/29/20 1714  Fri Feb 29, 2020  1537  luks,jennifer Sister     [id number]    [BM]    Clinical Course User Index [BM] Gari Crown   MDM Rules/Calculators/A&P                         Additional history obtained  from: 1. Nursing notes from this visit. 2. Family. ----------------------------- DG Left Hip:  IMPRESSION:  Severe degenerative joint disease of the right hip with probable  avascular necrosis of the right femoral head. Status post left hip  arthroplasty. Large amount of heterotopic bone formation is noted  around the left trochanteric region, with possible mildly displaced  fracture involving a portion of this bone formation of indeterminate  age.   CT Left Hip:  IMPRESSION:  1. Very limited examination due to the left hip contracture and  metal artifact.  2. No obvious periprosthetic hip fracture is identified.  ----- 55 year old male presented 2 weeks after he fell off the toilet after a strong sneeze, he has had persistent left hip pain since that time.  No neurologic complaints.  He denies any blood thinner use and this was confirmed by both patient's mother and his sister.  He has no history of head injury, headache, loss of consciousness nausea or vomiting.  Additionally no neck pain back pain chest pain abdominal pain upper extremity pain or right lower extremity pain.  No indication for additional imaging or blood work at this time. He is wheelchair-bound at baseline and has contractures of the bilateral lower extremities.  He has been unable to use his wheelchair due to pain of sitting for the last 2 weeks he has been laying in the bed.  On exam he has tenderness of the left hip but no deformity or overlying skin changes.  He has some dry skin to his buttock does not appear to be a pressure ulcer at this time but I did encourage patient and his mother to roll frequently to avoid pressure ulcers.  CT is limited in examination but does not show any obvious fracture.  Patient follows with [facility] orthopedic specialist so I have referred them back to them for follow-up.  Concern is that patient lives with his elderly mother and has been unable to use his wheelchair for the past 2 weeks so will talk with social worker as well as additional family members regarding further care.  There is no indication for medical admission at this time. -------------------- Shared decision making made between patient, his mother at bedside and his Sister Anderson Malta.  Plan of care is for patient to go home and his family will help with ADLs, social work consult placed for home health but family feels comfortable taking care of patient in the meantime.  They do not want nursing home placement, admission or any further work-up they are requesting discharge.  PMD P reviewed, no previous narcotic prescriptions listed.  Will give patient's short course (10 pills) Norco to help with left hip pain after fall, I discussed narcotic precautions with patient and family and they state understanding.  I referred them to [facility] per their request for follow-up.  They are aware that they will likely need follow-up imaging if symptoms do not improve to assess for occult fracture.  There is no indication for medical admission or further work-up at this time.  At this time there does not appear to be any evidence of an acute emergency medical condition and the patient appears stable for discharge with appropriate outpatient follow up. Diagnosis was discussed with patient who verbalizes understanding of care plan and is agreeable to discharge. I have discussed return precautions with patient who verbalizes understanding. Patient encouraged to follow-up with their PCP and Ortho. All questions answered.  Patient's case discussed with Dr. Langston Masker who agrees with plan  to discharge with follow-up.   Note: Portions of this report may have been transcribed using voice recognition software. Every effort was made to ensure accuracy;  however, inadvertent computerized transcription errors may still be present. Final Clinical Impression(s) / ED Diagnoses Final diagnoses:  Fall, initial encounter  Left hip pain    Rx / DC Orders ED Discharge Orders         [place]        02/29/20 1608    Face-to-face encounter (required for Medicare/Medicaid patients)       Comments: [place] certify that this patient is under my care and that I, or a nurse practitioner or physician's assistant working with me, had a face-to-face encounter that meets the physician face-to-face encounter requirements with this patient on 02/29/2020. The encounter with the patient was in whole, or in part for the following medical condition(s) which is the primary reason for home health care (List medical condition): Hip pain causing patient to remain bedbound; normally wheelchair bound.   02/29/20 1608    HYDROcodone-acetaminophen (NORCO/VICODIN) 5-325 MG tablet  Every 6 hours PRN        02/29/20 1656           Gari Crown 02/29/20 1714    Wyvonnia Dusky, MD 02/29/20 2208

## 2020-02-29 NOTE — ED Triage Notes (Signed)
Patient reports 2 weeks ago he was on the toilet and sneezed which caused him to fall off of the toilet. Patient c/o left hip. Patient reports a history of partial left hip replacement.

## 2020-03-07 DIAGNOSIS — M25552 Pain in left hip: Secondary | ICD-10-CM | POA: Diagnosis not present

## 2020-03-12 DIAGNOSIS — M62462 Contracture of muscle, left lower leg: Secondary | ICD-10-CM | POA: Diagnosis not present

## 2020-03-12 DIAGNOSIS — M1611 Unilateral primary osteoarthritis, right hip: Secondary | ICD-10-CM | POA: Diagnosis not present

## 2020-03-12 DIAGNOSIS — I10 Essential (primary) hypertension: Secondary | ICD-10-CM | POA: Diagnosis not present

## 2020-03-12 DIAGNOSIS — K5900 Constipation, unspecified: Secondary | ICD-10-CM | POA: Diagnosis not present

## 2020-03-12 DIAGNOSIS — K219 Gastro-esophageal reflux disease without esophagitis: Secondary | ICD-10-CM | POA: Diagnosis not present

## 2020-03-12 DIAGNOSIS — R251 Tremor, unspecified: Secondary | ICD-10-CM | POA: Diagnosis not present

## 2020-03-12 DIAGNOSIS — E785 Hyperlipidemia, unspecified: Secondary | ICD-10-CM | POA: Diagnosis not present

## 2020-03-12 DIAGNOSIS — M62461 Contracture of muscle, right lower leg: Secondary | ICD-10-CM | POA: Diagnosis not present

## 2020-03-12 DIAGNOSIS — C719 Malignant neoplasm of brain, unspecified: Secondary | ICD-10-CM | POA: Diagnosis not present

## 2020-03-13 DIAGNOSIS — E785 Hyperlipidemia, unspecified: Secondary | ICD-10-CM | POA: Diagnosis not present

## 2020-03-13 DIAGNOSIS — I10 Essential (primary) hypertension: Secondary | ICD-10-CM | POA: Diagnosis not present

## 2020-03-13 DIAGNOSIS — K219 Gastro-esophageal reflux disease without esophagitis: Secondary | ICD-10-CM | POA: Diagnosis not present

## 2020-03-13 DIAGNOSIS — C719 Malignant neoplasm of brain, unspecified: Secondary | ICD-10-CM | POA: Diagnosis not present

## 2020-03-13 DIAGNOSIS — M62461 Contracture of muscle, right lower leg: Secondary | ICD-10-CM | POA: Diagnosis not present

## 2020-03-13 DIAGNOSIS — K5900 Constipation, unspecified: Secondary | ICD-10-CM | POA: Diagnosis not present

## 2020-03-13 DIAGNOSIS — M62462 Contracture of muscle, left lower leg: Secondary | ICD-10-CM | POA: Diagnosis not present

## 2020-03-13 DIAGNOSIS — M1611 Unilateral primary osteoarthritis, right hip: Secondary | ICD-10-CM | POA: Diagnosis not present

## 2020-03-13 DIAGNOSIS — R251 Tremor, unspecified: Secondary | ICD-10-CM | POA: Diagnosis not present

## 2020-03-20 DIAGNOSIS — R251 Tremor, unspecified: Secondary | ICD-10-CM | POA: Diagnosis not present

## 2020-03-20 DIAGNOSIS — I10 Essential (primary) hypertension: Secondary | ICD-10-CM | POA: Diagnosis not present

## 2020-03-20 DIAGNOSIS — M62461 Contracture of muscle, right lower leg: Secondary | ICD-10-CM | POA: Diagnosis not present

## 2020-03-20 DIAGNOSIS — K219 Gastro-esophageal reflux disease without esophagitis: Secondary | ICD-10-CM | POA: Diagnosis not present

## 2020-03-20 DIAGNOSIS — M1611 Unilateral primary osteoarthritis, right hip: Secondary | ICD-10-CM | POA: Diagnosis not present

## 2020-03-20 DIAGNOSIS — E785 Hyperlipidemia, unspecified: Secondary | ICD-10-CM | POA: Diagnosis not present

## 2020-03-20 DIAGNOSIS — K5900 Constipation, unspecified: Secondary | ICD-10-CM | POA: Diagnosis not present

## 2020-03-20 DIAGNOSIS — M62462 Contracture of muscle, left lower leg: Secondary | ICD-10-CM | POA: Diagnosis not present

## 2020-03-20 DIAGNOSIS — C719 Malignant neoplasm of brain, unspecified: Secondary | ICD-10-CM | POA: Diagnosis not present

## 2020-03-24 DIAGNOSIS — M62461 Contracture of muscle, right lower leg: Secondary | ICD-10-CM | POA: Diagnosis not present

## 2020-03-24 DIAGNOSIS — M62462 Contracture of muscle, left lower leg: Secondary | ICD-10-CM | POA: Diagnosis not present

## 2020-03-24 DIAGNOSIS — I10 Essential (primary) hypertension: Secondary | ICD-10-CM | POA: Diagnosis not present

## 2020-03-24 DIAGNOSIS — C719 Malignant neoplasm of brain, unspecified: Secondary | ICD-10-CM | POA: Diagnosis not present

## 2020-03-24 DIAGNOSIS — E785 Hyperlipidemia, unspecified: Secondary | ICD-10-CM | POA: Diagnosis not present

## 2020-03-24 DIAGNOSIS — R251 Tremor, unspecified: Secondary | ICD-10-CM | POA: Diagnosis not present

## 2020-03-24 DIAGNOSIS — K5900 Constipation, unspecified: Secondary | ICD-10-CM | POA: Diagnosis not present

## 2020-03-24 DIAGNOSIS — M1611 Unilateral primary osteoarthritis, right hip: Secondary | ICD-10-CM | POA: Diagnosis not present

## 2020-03-24 DIAGNOSIS — K219 Gastro-esophageal reflux disease without esophagitis: Secondary | ICD-10-CM | POA: Diagnosis not present

## 2020-03-26 DIAGNOSIS — I10 Essential (primary) hypertension: Secondary | ICD-10-CM | POA: Diagnosis not present

## 2020-03-26 DIAGNOSIS — M62461 Contracture of muscle, right lower leg: Secondary | ICD-10-CM | POA: Diagnosis not present

## 2020-03-26 DIAGNOSIS — M1611 Unilateral primary osteoarthritis, right hip: Secondary | ICD-10-CM | POA: Diagnosis not present

## 2020-03-26 DIAGNOSIS — R251 Tremor, unspecified: Secondary | ICD-10-CM | POA: Diagnosis not present

## 2020-03-26 DIAGNOSIS — K5900 Constipation, unspecified: Secondary | ICD-10-CM | POA: Diagnosis not present

## 2020-03-26 DIAGNOSIS — M62462 Contracture of muscle, left lower leg: Secondary | ICD-10-CM | POA: Diagnosis not present

## 2020-03-26 DIAGNOSIS — K219 Gastro-esophageal reflux disease without esophagitis: Secondary | ICD-10-CM | POA: Diagnosis not present

## 2020-03-26 DIAGNOSIS — E785 Hyperlipidemia, unspecified: Secondary | ICD-10-CM | POA: Diagnosis not present

## 2020-03-26 DIAGNOSIS — C719 Malignant neoplasm of brain, unspecified: Secondary | ICD-10-CM | POA: Diagnosis not present

## 2020-03-27 DIAGNOSIS — M1611 Unilateral primary osteoarthritis, right hip: Secondary | ICD-10-CM | POA: Diagnosis not present

## 2020-03-27 DIAGNOSIS — C719 Malignant neoplasm of brain, unspecified: Secondary | ICD-10-CM | POA: Diagnosis not present

## 2020-03-27 DIAGNOSIS — M62461 Contracture of muscle, right lower leg: Secondary | ICD-10-CM | POA: Diagnosis not present

## 2020-03-27 DIAGNOSIS — K219 Gastro-esophageal reflux disease without esophagitis: Secondary | ICD-10-CM | POA: Diagnosis not present

## 2020-03-27 DIAGNOSIS — R251 Tremor, unspecified: Secondary | ICD-10-CM | POA: Diagnosis not present

## 2020-03-27 DIAGNOSIS — I10 Essential (primary) hypertension: Secondary | ICD-10-CM | POA: Diagnosis not present

## 2020-03-27 DIAGNOSIS — M62462 Contracture of muscle, left lower leg: Secondary | ICD-10-CM | POA: Diagnosis not present

## 2020-03-27 DIAGNOSIS — K5900 Constipation, unspecified: Secondary | ICD-10-CM | POA: Diagnosis not present

## 2020-03-27 DIAGNOSIS — E785 Hyperlipidemia, unspecified: Secondary | ICD-10-CM | POA: Diagnosis not present

## 2020-03-29 DIAGNOSIS — M1611 Unilateral primary osteoarthritis, right hip: Secondary | ICD-10-CM | POA: Diagnosis not present

## 2020-03-29 DIAGNOSIS — K219 Gastro-esophageal reflux disease without esophagitis: Secondary | ICD-10-CM | POA: Diagnosis not present

## 2020-03-29 DIAGNOSIS — K5900 Constipation, unspecified: Secondary | ICD-10-CM | POA: Diagnosis not present

## 2020-03-29 DIAGNOSIS — E785 Hyperlipidemia, unspecified: Secondary | ICD-10-CM | POA: Diagnosis not present

## 2020-03-29 DIAGNOSIS — M62461 Contracture of muscle, right lower leg: Secondary | ICD-10-CM | POA: Diagnosis not present

## 2020-03-29 DIAGNOSIS — I10 Essential (primary) hypertension: Secondary | ICD-10-CM | POA: Diagnosis not present

## 2020-03-29 DIAGNOSIS — C719 Malignant neoplasm of brain, unspecified: Secondary | ICD-10-CM | POA: Diagnosis not present

## 2020-03-29 DIAGNOSIS — M62462 Contracture of muscle, left lower leg: Secondary | ICD-10-CM | POA: Diagnosis not present

## 2020-03-29 DIAGNOSIS — R251 Tremor, unspecified: Secondary | ICD-10-CM | POA: Diagnosis not present

## 2020-03-31 DIAGNOSIS — M62461 Contracture of muscle, right lower leg: Secondary | ICD-10-CM | POA: Diagnosis not present

## 2020-03-31 DIAGNOSIS — R251 Tremor, unspecified: Secondary | ICD-10-CM | POA: Diagnosis not present

## 2020-03-31 DIAGNOSIS — E785 Hyperlipidemia, unspecified: Secondary | ICD-10-CM | POA: Diagnosis not present

## 2020-03-31 DIAGNOSIS — K219 Gastro-esophageal reflux disease without esophagitis: Secondary | ICD-10-CM | POA: Diagnosis not present

## 2020-03-31 DIAGNOSIS — M1611 Unilateral primary osteoarthritis, right hip: Secondary | ICD-10-CM | POA: Diagnosis not present

## 2020-03-31 DIAGNOSIS — K5900 Constipation, unspecified: Secondary | ICD-10-CM | POA: Diagnosis not present

## 2020-03-31 DIAGNOSIS — M62462 Contracture of muscle, left lower leg: Secondary | ICD-10-CM | POA: Diagnosis not present

## 2020-03-31 DIAGNOSIS — C719 Malignant neoplasm of brain, unspecified: Secondary | ICD-10-CM | POA: Diagnosis not present

## 2020-03-31 DIAGNOSIS — I10 Essential (primary) hypertension: Secondary | ICD-10-CM | POA: Diagnosis not present

## 2020-04-01 DIAGNOSIS — K5900 Constipation, unspecified: Secondary | ICD-10-CM | POA: Diagnosis not present

## 2020-04-01 DIAGNOSIS — K219 Gastro-esophageal reflux disease without esophagitis: Secondary | ICD-10-CM | POA: Diagnosis not present

## 2020-04-01 DIAGNOSIS — M62461 Contracture of muscle, right lower leg: Secondary | ICD-10-CM | POA: Diagnosis not present

## 2020-04-01 DIAGNOSIS — E785 Hyperlipidemia, unspecified: Secondary | ICD-10-CM | POA: Diagnosis not present

## 2020-04-01 DIAGNOSIS — R251 Tremor, unspecified: Secondary | ICD-10-CM | POA: Diagnosis not present

## 2020-04-01 DIAGNOSIS — C719 Malignant neoplasm of brain, unspecified: Secondary | ICD-10-CM | POA: Diagnosis not present

## 2020-04-01 DIAGNOSIS — M62462 Contracture of muscle, left lower leg: Secondary | ICD-10-CM | POA: Diagnosis not present

## 2020-04-01 DIAGNOSIS — M1611 Unilateral primary osteoarthritis, right hip: Secondary | ICD-10-CM | POA: Diagnosis not present

## 2020-04-01 DIAGNOSIS — I10 Essential (primary) hypertension: Secondary | ICD-10-CM | POA: Diagnosis not present

## 2020-04-02 DIAGNOSIS — K219 Gastro-esophageal reflux disease without esophagitis: Secondary | ICD-10-CM | POA: Diagnosis not present

## 2020-04-02 DIAGNOSIS — K5900 Constipation, unspecified: Secondary | ICD-10-CM | POA: Diagnosis not present

## 2020-04-02 DIAGNOSIS — M62461 Contracture of muscle, right lower leg: Secondary | ICD-10-CM | POA: Diagnosis not present

## 2020-04-02 DIAGNOSIS — M1611 Unilateral primary osteoarthritis, right hip: Secondary | ICD-10-CM | POA: Diagnosis not present

## 2020-04-02 DIAGNOSIS — M62462 Contracture of muscle, left lower leg: Secondary | ICD-10-CM | POA: Diagnosis not present

## 2020-04-02 DIAGNOSIS — C719 Malignant neoplasm of brain, unspecified: Secondary | ICD-10-CM | POA: Diagnosis not present

## 2020-04-02 DIAGNOSIS — I10 Essential (primary) hypertension: Secondary | ICD-10-CM | POA: Diagnosis not present

## 2020-04-02 DIAGNOSIS — R251 Tremor, unspecified: Secondary | ICD-10-CM | POA: Diagnosis not present

## 2020-04-02 DIAGNOSIS — E785 Hyperlipidemia, unspecified: Secondary | ICD-10-CM | POA: Diagnosis not present

## 2020-04-04 DIAGNOSIS — M25552 Pain in left hip: Secondary | ICD-10-CM | POA: Diagnosis not present

## 2020-04-04 DIAGNOSIS — M545 Low back pain, unspecified: Secondary | ICD-10-CM | POA: Diagnosis not present

## 2020-04-05 DIAGNOSIS — K219 Gastro-esophageal reflux disease without esophagitis: Secondary | ICD-10-CM | POA: Diagnosis not present

## 2020-04-05 DIAGNOSIS — E785 Hyperlipidemia, unspecified: Secondary | ICD-10-CM | POA: Diagnosis not present

## 2020-04-05 DIAGNOSIS — C719 Malignant neoplasm of brain, unspecified: Secondary | ICD-10-CM | POA: Diagnosis not present

## 2020-04-05 DIAGNOSIS — M62462 Contracture of muscle, left lower leg: Secondary | ICD-10-CM | POA: Diagnosis not present

## 2020-04-05 DIAGNOSIS — M62461 Contracture of muscle, right lower leg: Secondary | ICD-10-CM | POA: Diagnosis not present

## 2020-04-05 DIAGNOSIS — M1611 Unilateral primary osteoarthritis, right hip: Secondary | ICD-10-CM | POA: Diagnosis not present

## 2020-04-05 DIAGNOSIS — I10 Essential (primary) hypertension: Secondary | ICD-10-CM | POA: Diagnosis not present

## 2020-04-05 DIAGNOSIS — K5900 Constipation, unspecified: Secondary | ICD-10-CM | POA: Diagnosis not present

## 2020-04-05 DIAGNOSIS — R251 Tremor, unspecified: Secondary | ICD-10-CM | POA: Diagnosis not present

## 2020-04-07 ENCOUNTER — Other Ambulatory Visit: Payer: Self-pay | Admitting: Orthopedic Surgery

## 2020-04-07 ENCOUNTER — Other Ambulatory Visit (HOSPITAL_COMMUNITY): Payer: Self-pay | Admitting: Orthopedic Surgery

## 2020-04-07 DIAGNOSIS — E785 Hyperlipidemia, unspecified: Secondary | ICD-10-CM | POA: Diagnosis not present

## 2020-04-07 DIAGNOSIS — C719 Malignant neoplasm of brain, unspecified: Secondary | ICD-10-CM | POA: Diagnosis not present

## 2020-04-07 DIAGNOSIS — M62462 Contracture of muscle, left lower leg: Secondary | ICD-10-CM | POA: Diagnosis not present

## 2020-04-07 DIAGNOSIS — M62461 Contracture of muscle, right lower leg: Secondary | ICD-10-CM | POA: Diagnosis not present

## 2020-04-07 DIAGNOSIS — M1611 Unilateral primary osteoarthritis, right hip: Secondary | ICD-10-CM | POA: Diagnosis not present

## 2020-04-07 DIAGNOSIS — K5900 Constipation, unspecified: Secondary | ICD-10-CM | POA: Diagnosis not present

## 2020-04-07 DIAGNOSIS — M25559 Pain in unspecified hip: Secondary | ICD-10-CM

## 2020-04-07 DIAGNOSIS — K219 Gastro-esophageal reflux disease without esophagitis: Secondary | ICD-10-CM | POA: Diagnosis not present

## 2020-04-07 DIAGNOSIS — R251 Tremor, unspecified: Secondary | ICD-10-CM | POA: Diagnosis not present

## 2020-04-07 DIAGNOSIS — I10 Essential (primary) hypertension: Secondary | ICD-10-CM | POA: Diagnosis not present

## 2020-04-08 ENCOUNTER — Ambulatory Visit (HOSPITAL_COMMUNITY): Payer: Medicare HMO

## 2020-04-08 DIAGNOSIS — M62462 Contracture of muscle, left lower leg: Secondary | ICD-10-CM | POA: Diagnosis not present

## 2020-04-08 DIAGNOSIS — K5900 Constipation, unspecified: Secondary | ICD-10-CM | POA: Diagnosis not present

## 2020-04-08 DIAGNOSIS — M1611 Unilateral primary osteoarthritis, right hip: Secondary | ICD-10-CM | POA: Diagnosis not present

## 2020-04-08 DIAGNOSIS — M62461 Contracture of muscle, right lower leg: Secondary | ICD-10-CM | POA: Diagnosis not present

## 2020-04-08 DIAGNOSIS — C719 Malignant neoplasm of brain, unspecified: Secondary | ICD-10-CM | POA: Diagnosis not present

## 2020-04-08 DIAGNOSIS — E785 Hyperlipidemia, unspecified: Secondary | ICD-10-CM | POA: Diagnosis not present

## 2020-04-08 DIAGNOSIS — K219 Gastro-esophageal reflux disease without esophagitis: Secondary | ICD-10-CM | POA: Diagnosis not present

## 2020-04-08 DIAGNOSIS — R251 Tremor, unspecified: Secondary | ICD-10-CM | POA: Diagnosis not present

## 2020-04-08 DIAGNOSIS — I10 Essential (primary) hypertension: Secondary | ICD-10-CM | POA: Diagnosis not present

## 2020-04-10 DIAGNOSIS — I10 Essential (primary) hypertension: Secondary | ICD-10-CM | POA: Diagnosis not present

## 2020-04-10 DIAGNOSIS — C719 Malignant neoplasm of brain, unspecified: Secondary | ICD-10-CM | POA: Diagnosis not present

## 2020-04-10 DIAGNOSIS — K5900 Constipation, unspecified: Secondary | ICD-10-CM | POA: Diagnosis not present

## 2020-04-10 DIAGNOSIS — R251 Tremor, unspecified: Secondary | ICD-10-CM | POA: Diagnosis not present

## 2020-04-10 DIAGNOSIS — M62461 Contracture of muscle, right lower leg: Secondary | ICD-10-CM | POA: Diagnosis not present

## 2020-04-10 DIAGNOSIS — K219 Gastro-esophageal reflux disease without esophagitis: Secondary | ICD-10-CM | POA: Diagnosis not present

## 2020-04-10 DIAGNOSIS — M62462 Contracture of muscle, left lower leg: Secondary | ICD-10-CM | POA: Diagnosis not present

## 2020-04-10 DIAGNOSIS — E785 Hyperlipidemia, unspecified: Secondary | ICD-10-CM | POA: Diagnosis not present

## 2020-04-10 DIAGNOSIS — M1611 Unilateral primary osteoarthritis, right hip: Secondary | ICD-10-CM | POA: Diagnosis not present

## 2020-04-11 ENCOUNTER — Encounter (HOSPITAL_COMMUNITY): Payer: Self-pay

## 2020-04-11 ENCOUNTER — Ambulatory Visit (HOSPITAL_COMMUNITY): Payer: Medicare HMO

## 2020-04-11 DIAGNOSIS — K5900 Constipation, unspecified: Secondary | ICD-10-CM | POA: Diagnosis not present

## 2020-04-11 DIAGNOSIS — E785 Hyperlipidemia, unspecified: Secondary | ICD-10-CM | POA: Diagnosis not present

## 2020-04-11 DIAGNOSIS — R251 Tremor, unspecified: Secondary | ICD-10-CM | POA: Diagnosis not present

## 2020-04-11 DIAGNOSIS — I10 Essential (primary) hypertension: Secondary | ICD-10-CM | POA: Diagnosis not present

## 2020-04-11 DIAGNOSIS — M1611 Unilateral primary osteoarthritis, right hip: Secondary | ICD-10-CM | POA: Diagnosis not present

## 2020-04-11 DIAGNOSIS — K219 Gastro-esophageal reflux disease without esophagitis: Secondary | ICD-10-CM | POA: Diagnosis not present

## 2020-04-11 DIAGNOSIS — M62461 Contracture of muscle, right lower leg: Secondary | ICD-10-CM | POA: Diagnosis not present

## 2020-04-11 DIAGNOSIS — M62462 Contracture of muscle, left lower leg: Secondary | ICD-10-CM | POA: Diagnosis not present

## 2020-04-11 DIAGNOSIS — C719 Malignant neoplasm of brain, unspecified: Secondary | ICD-10-CM | POA: Diagnosis not present

## 2020-04-12 DIAGNOSIS — C719 Malignant neoplasm of brain, unspecified: Secondary | ICD-10-CM | POA: Diagnosis not present

## 2020-04-12 DIAGNOSIS — K219 Gastro-esophageal reflux disease without esophagitis: Secondary | ICD-10-CM | POA: Diagnosis not present

## 2020-04-12 DIAGNOSIS — R251 Tremor, unspecified: Secondary | ICD-10-CM | POA: Diagnosis not present

## 2020-04-12 DIAGNOSIS — I10 Essential (primary) hypertension: Secondary | ICD-10-CM | POA: Diagnosis not present

## 2020-04-12 DIAGNOSIS — M62461 Contracture of muscle, right lower leg: Secondary | ICD-10-CM | POA: Diagnosis not present

## 2020-04-12 DIAGNOSIS — M1611 Unilateral primary osteoarthritis, right hip: Secondary | ICD-10-CM | POA: Diagnosis not present

## 2020-04-12 DIAGNOSIS — E785 Hyperlipidemia, unspecified: Secondary | ICD-10-CM | POA: Diagnosis not present

## 2020-04-12 DIAGNOSIS — M62462 Contracture of muscle, left lower leg: Secondary | ICD-10-CM | POA: Diagnosis not present

## 2020-04-12 DIAGNOSIS — K5900 Constipation, unspecified: Secondary | ICD-10-CM | POA: Diagnosis not present

## 2020-04-14 DIAGNOSIS — C719 Malignant neoplasm of brain, unspecified: Secondary | ICD-10-CM | POA: Diagnosis not present

## 2020-04-14 DIAGNOSIS — R251 Tremor, unspecified: Secondary | ICD-10-CM | POA: Diagnosis not present

## 2020-04-14 DIAGNOSIS — E785 Hyperlipidemia, unspecified: Secondary | ICD-10-CM | POA: Diagnosis not present

## 2020-04-14 DIAGNOSIS — K219 Gastro-esophageal reflux disease without esophagitis: Secondary | ICD-10-CM | POA: Diagnosis not present

## 2020-04-14 DIAGNOSIS — M1611 Unilateral primary osteoarthritis, right hip: Secondary | ICD-10-CM | POA: Diagnosis not present

## 2020-04-14 DIAGNOSIS — K5900 Constipation, unspecified: Secondary | ICD-10-CM | POA: Diagnosis not present

## 2020-04-14 DIAGNOSIS — M62462 Contracture of muscle, left lower leg: Secondary | ICD-10-CM | POA: Diagnosis not present

## 2020-04-14 DIAGNOSIS — M62461 Contracture of muscle, right lower leg: Secondary | ICD-10-CM | POA: Diagnosis not present

## 2020-04-14 DIAGNOSIS — I10 Essential (primary) hypertension: Secondary | ICD-10-CM | POA: Diagnosis not present

## 2020-04-15 DIAGNOSIS — K219 Gastro-esophageal reflux disease without esophagitis: Secondary | ICD-10-CM | POA: Diagnosis not present

## 2020-04-15 DIAGNOSIS — E785 Hyperlipidemia, unspecified: Secondary | ICD-10-CM | POA: Diagnosis not present

## 2020-04-15 DIAGNOSIS — M62462 Contracture of muscle, left lower leg: Secondary | ICD-10-CM | POA: Diagnosis not present

## 2020-04-15 DIAGNOSIS — I10 Essential (primary) hypertension: Secondary | ICD-10-CM | POA: Diagnosis not present

## 2020-04-15 DIAGNOSIS — M1611 Unilateral primary osteoarthritis, right hip: Secondary | ICD-10-CM | POA: Diagnosis not present

## 2020-04-15 DIAGNOSIS — R251 Tremor, unspecified: Secondary | ICD-10-CM | POA: Diagnosis not present

## 2020-04-15 DIAGNOSIS — M62461 Contracture of muscle, right lower leg: Secondary | ICD-10-CM | POA: Diagnosis not present

## 2020-04-15 DIAGNOSIS — C719 Malignant neoplasm of brain, unspecified: Secondary | ICD-10-CM | POA: Diagnosis not present

## 2020-04-15 DIAGNOSIS — K5900 Constipation, unspecified: Secondary | ICD-10-CM | POA: Diagnosis not present

## 2020-04-16 DIAGNOSIS — C719 Malignant neoplasm of brain, unspecified: Secondary | ICD-10-CM | POA: Diagnosis not present

## 2020-04-16 DIAGNOSIS — K5900 Constipation, unspecified: Secondary | ICD-10-CM | POA: Diagnosis not present

## 2020-04-16 DIAGNOSIS — M62462 Contracture of muscle, left lower leg: Secondary | ICD-10-CM | POA: Diagnosis not present

## 2020-04-16 DIAGNOSIS — M62461 Contracture of muscle, right lower leg: Secondary | ICD-10-CM | POA: Diagnosis not present

## 2020-04-16 DIAGNOSIS — E785 Hyperlipidemia, unspecified: Secondary | ICD-10-CM | POA: Diagnosis not present

## 2020-04-16 DIAGNOSIS — K219 Gastro-esophageal reflux disease without esophagitis: Secondary | ICD-10-CM | POA: Diagnosis not present

## 2020-04-16 DIAGNOSIS — R251 Tremor, unspecified: Secondary | ICD-10-CM | POA: Diagnosis not present

## 2020-04-16 DIAGNOSIS — M1611 Unilateral primary osteoarthritis, right hip: Secondary | ICD-10-CM | POA: Diagnosis not present

## 2020-04-16 DIAGNOSIS — I10 Essential (primary) hypertension: Secondary | ICD-10-CM | POA: Diagnosis not present

## 2020-04-17 ENCOUNTER — Other Ambulatory Visit: Payer: Self-pay

## 2020-04-17 ENCOUNTER — Ambulatory Visit (HOSPITAL_COMMUNITY)
Admission: RE | Admit: 2020-04-17 | Discharge: 2020-04-17 | Disposition: A | Discharge status: Home / Self Care | Payer: Medicare HMO | Source: Ambulatory Visit | Attending: Orthopedic Surgery | Admitting: Orthopedic Surgery

## 2020-04-17 DIAGNOSIS — S32402A Unspecified fracture of left acetabulum, initial encounter for closed fracture: Secondary | ICD-10-CM | POA: Diagnosis not present

## 2020-04-17 DIAGNOSIS — W19XXXA Unspecified fall, initial encounter: Secondary | ICD-10-CM | POA: Diagnosis not present

## 2020-04-17 DIAGNOSIS — R52 Pain, unspecified: Secondary | ICD-10-CM | POA: Diagnosis not present

## 2020-04-17 DIAGNOSIS — M25559 Pain in unspecified hip: Secondary | ICD-10-CM | POA: Diagnosis not present

## 2020-04-17 DIAGNOSIS — M2550 Pain in unspecified joint: Secondary | ICD-10-CM | POA: Diagnosis not present

## 2020-04-17 DIAGNOSIS — Z7401 Bed confinement status: Secondary | ICD-10-CM | POA: Diagnosis not present

## 2020-04-17 DIAGNOSIS — R5381 Other malaise: Secondary | ICD-10-CM | POA: Diagnosis not present

## 2020-04-17 DIAGNOSIS — M25552 Pain in left hip: Secondary | ICD-10-CM | POA: Diagnosis not present

## 2020-04-18 ENCOUNTER — Ambulatory Visit (HOSPITAL_COMMUNITY): Payer: Medicare HMO

## 2020-04-18 DIAGNOSIS — C719 Malignant neoplasm of brain, unspecified: Secondary | ICD-10-CM | POA: Diagnosis not present

## 2020-04-18 DIAGNOSIS — E785 Hyperlipidemia, unspecified: Secondary | ICD-10-CM | POA: Diagnosis not present

## 2020-04-18 DIAGNOSIS — K219 Gastro-esophageal reflux disease without esophagitis: Secondary | ICD-10-CM | POA: Diagnosis not present

## 2020-04-18 DIAGNOSIS — M62461 Contracture of muscle, right lower leg: Secondary | ICD-10-CM | POA: Diagnosis not present

## 2020-04-18 DIAGNOSIS — I10 Essential (primary) hypertension: Secondary | ICD-10-CM | POA: Diagnosis not present

## 2020-04-18 DIAGNOSIS — R251 Tremor, unspecified: Secondary | ICD-10-CM | POA: Diagnosis not present

## 2020-04-18 DIAGNOSIS — K5900 Constipation, unspecified: Secondary | ICD-10-CM | POA: Diagnosis not present

## 2020-04-18 DIAGNOSIS — M62462 Contracture of muscle, left lower leg: Secondary | ICD-10-CM | POA: Diagnosis not present

## 2020-04-18 DIAGNOSIS — M1611 Unilateral primary osteoarthritis, right hip: Secondary | ICD-10-CM | POA: Diagnosis not present

## 2020-04-21 DIAGNOSIS — I10 Essential (primary) hypertension: Secondary | ICD-10-CM | POA: Diagnosis not present

## 2020-04-21 DIAGNOSIS — R251 Tremor, unspecified: Secondary | ICD-10-CM | POA: Diagnosis not present

## 2020-04-21 DIAGNOSIS — M1611 Unilateral primary osteoarthritis, right hip: Secondary | ICD-10-CM | POA: Diagnosis not present

## 2020-04-21 DIAGNOSIS — K219 Gastro-esophageal reflux disease without esophagitis: Secondary | ICD-10-CM | POA: Diagnosis not present

## 2020-04-21 DIAGNOSIS — M62462 Contracture of muscle, left lower leg: Secondary | ICD-10-CM | POA: Diagnosis not present

## 2020-04-21 DIAGNOSIS — C719 Malignant neoplasm of brain, unspecified: Secondary | ICD-10-CM | POA: Diagnosis not present

## 2020-04-21 DIAGNOSIS — M62461 Contracture of muscle, right lower leg: Secondary | ICD-10-CM | POA: Diagnosis not present

## 2020-04-21 DIAGNOSIS — K5900 Constipation, unspecified: Secondary | ICD-10-CM | POA: Diagnosis not present

## 2020-04-21 DIAGNOSIS — E785 Hyperlipidemia, unspecified: Secondary | ICD-10-CM | POA: Diagnosis not present

## 2020-04-22 DIAGNOSIS — C719 Malignant neoplasm of brain, unspecified: Secondary | ICD-10-CM | POA: Diagnosis not present

## 2020-04-22 DIAGNOSIS — E785 Hyperlipidemia, unspecified: Secondary | ICD-10-CM | POA: Diagnosis not present

## 2020-04-22 DIAGNOSIS — M62461 Contracture of muscle, right lower leg: Secondary | ICD-10-CM | POA: Diagnosis not present

## 2020-04-22 DIAGNOSIS — K219 Gastro-esophageal reflux disease without esophagitis: Secondary | ICD-10-CM | POA: Diagnosis not present

## 2020-04-22 DIAGNOSIS — M1611 Unilateral primary osteoarthritis, right hip: Secondary | ICD-10-CM | POA: Diagnosis not present

## 2020-04-22 DIAGNOSIS — R251 Tremor, unspecified: Secondary | ICD-10-CM | POA: Diagnosis not present

## 2020-04-22 DIAGNOSIS — M62462 Contracture of muscle, left lower leg: Secondary | ICD-10-CM | POA: Diagnosis not present

## 2020-04-22 DIAGNOSIS — K5900 Constipation, unspecified: Secondary | ICD-10-CM | POA: Diagnosis not present

## 2020-04-22 DIAGNOSIS — I10 Essential (primary) hypertension: Secondary | ICD-10-CM | POA: Diagnosis not present

## 2020-04-23 DIAGNOSIS — I10 Essential (primary) hypertension: Secondary | ICD-10-CM | POA: Diagnosis not present

## 2020-04-23 DIAGNOSIS — M62462 Contracture of muscle, left lower leg: Secondary | ICD-10-CM | POA: Diagnosis not present

## 2020-04-23 DIAGNOSIS — M1611 Unilateral primary osteoarthritis, right hip: Secondary | ICD-10-CM | POA: Diagnosis not present

## 2020-04-23 DIAGNOSIS — K5900 Constipation, unspecified: Secondary | ICD-10-CM | POA: Diagnosis not present

## 2020-04-23 DIAGNOSIS — E785 Hyperlipidemia, unspecified: Secondary | ICD-10-CM | POA: Diagnosis not present

## 2020-04-23 DIAGNOSIS — R251 Tremor, unspecified: Secondary | ICD-10-CM | POA: Diagnosis not present

## 2020-04-23 DIAGNOSIS — M62461 Contracture of muscle, right lower leg: Secondary | ICD-10-CM | POA: Diagnosis not present

## 2020-04-23 DIAGNOSIS — K219 Gastro-esophageal reflux disease without esophagitis: Secondary | ICD-10-CM | POA: Diagnosis not present

## 2020-04-23 DIAGNOSIS — C719 Malignant neoplasm of brain, unspecified: Secondary | ICD-10-CM | POA: Diagnosis not present

## 2020-04-25 DIAGNOSIS — M25552 Pain in left hip: Secondary | ICD-10-CM | POA: Diagnosis not present

## 2020-04-25 DIAGNOSIS — K5900 Constipation, unspecified: Secondary | ICD-10-CM | POA: Diagnosis not present

## 2020-04-25 DIAGNOSIS — M62462 Contracture of muscle, left lower leg: Secondary | ICD-10-CM | POA: Diagnosis not present

## 2020-04-25 DIAGNOSIS — I10 Essential (primary) hypertension: Secondary | ICD-10-CM | POA: Diagnosis not present

## 2020-04-25 DIAGNOSIS — M1611 Unilateral primary osteoarthritis, right hip: Secondary | ICD-10-CM | POA: Diagnosis not present

## 2020-04-25 DIAGNOSIS — C719 Malignant neoplasm of brain, unspecified: Secondary | ICD-10-CM | POA: Diagnosis not present

## 2020-04-25 DIAGNOSIS — E785 Hyperlipidemia, unspecified: Secondary | ICD-10-CM | POA: Diagnosis not present

## 2020-04-25 DIAGNOSIS — K219 Gastro-esophageal reflux disease without esophagitis: Secondary | ICD-10-CM | POA: Diagnosis not present

## 2020-04-25 DIAGNOSIS — R251 Tremor, unspecified: Secondary | ICD-10-CM | POA: Diagnosis not present

## 2020-04-25 DIAGNOSIS — M62461 Contracture of muscle, right lower leg: Secondary | ICD-10-CM | POA: Diagnosis not present

## 2020-04-28 DIAGNOSIS — E785 Hyperlipidemia, unspecified: Secondary | ICD-10-CM | POA: Diagnosis not present

## 2020-04-28 DIAGNOSIS — K219 Gastro-esophageal reflux disease without esophagitis: Secondary | ICD-10-CM | POA: Diagnosis not present

## 2020-04-28 DIAGNOSIS — R251 Tremor, unspecified: Secondary | ICD-10-CM | POA: Diagnosis not present

## 2020-04-28 DIAGNOSIS — M62462 Contracture of muscle, left lower leg: Secondary | ICD-10-CM | POA: Diagnosis not present

## 2020-04-28 DIAGNOSIS — C719 Malignant neoplasm of brain, unspecified: Secondary | ICD-10-CM | POA: Diagnosis not present

## 2020-04-28 DIAGNOSIS — K5900 Constipation, unspecified: Secondary | ICD-10-CM | POA: Diagnosis not present

## 2020-04-28 DIAGNOSIS — M1611 Unilateral primary osteoarthritis, right hip: Secondary | ICD-10-CM | POA: Diagnosis not present

## 2020-04-28 DIAGNOSIS — I10 Essential (primary) hypertension: Secondary | ICD-10-CM | POA: Diagnosis not present

## 2020-04-28 DIAGNOSIS — M62461 Contracture of muscle, right lower leg: Secondary | ICD-10-CM | POA: Diagnosis not present

## 2020-04-30 DIAGNOSIS — M62461 Contracture of muscle, right lower leg: Secondary | ICD-10-CM | POA: Diagnosis not present

## 2020-04-30 DIAGNOSIS — K219 Gastro-esophageal reflux disease without esophagitis: Secondary | ICD-10-CM | POA: Diagnosis not present

## 2020-04-30 DIAGNOSIS — K5900 Constipation, unspecified: Secondary | ICD-10-CM | POA: Diagnosis not present

## 2020-04-30 DIAGNOSIS — C719 Malignant neoplasm of brain, unspecified: Secondary | ICD-10-CM | POA: Diagnosis not present

## 2020-04-30 DIAGNOSIS — R251 Tremor, unspecified: Secondary | ICD-10-CM | POA: Diagnosis not present

## 2020-04-30 DIAGNOSIS — E785 Hyperlipidemia, unspecified: Secondary | ICD-10-CM | POA: Diagnosis not present

## 2020-04-30 DIAGNOSIS — M1611 Unilateral primary osteoarthritis, right hip: Secondary | ICD-10-CM | POA: Diagnosis not present

## 2020-04-30 DIAGNOSIS — I10 Essential (primary) hypertension: Secondary | ICD-10-CM | POA: Diagnosis not present

## 2020-04-30 DIAGNOSIS — M62462 Contracture of muscle, left lower leg: Secondary | ICD-10-CM | POA: Diagnosis not present

## 2020-05-02 DIAGNOSIS — E785 Hyperlipidemia, unspecified: Secondary | ICD-10-CM | POA: Diagnosis not present

## 2020-05-02 DIAGNOSIS — M1611 Unilateral primary osteoarthritis, right hip: Secondary | ICD-10-CM | POA: Diagnosis not present

## 2020-05-02 DIAGNOSIS — I10 Essential (primary) hypertension: Secondary | ICD-10-CM | POA: Diagnosis not present

## 2020-05-02 DIAGNOSIS — R251 Tremor, unspecified: Secondary | ICD-10-CM | POA: Diagnosis not present

## 2020-05-02 DIAGNOSIS — K5900 Constipation, unspecified: Secondary | ICD-10-CM | POA: Diagnosis not present

## 2020-05-02 DIAGNOSIS — K219 Gastro-esophageal reflux disease without esophagitis: Secondary | ICD-10-CM | POA: Diagnosis not present

## 2020-05-02 DIAGNOSIS — M62462 Contracture of muscle, left lower leg: Secondary | ICD-10-CM | POA: Diagnosis not present

## 2020-05-02 DIAGNOSIS — C719 Malignant neoplasm of brain, unspecified: Secondary | ICD-10-CM | POA: Diagnosis not present

## 2020-05-02 DIAGNOSIS — M62461 Contracture of muscle, right lower leg: Secondary | ICD-10-CM | POA: Diagnosis not present

## 2020-05-05 DIAGNOSIS — M62462 Contracture of muscle, left lower leg: Secondary | ICD-10-CM | POA: Diagnosis not present

## 2020-05-05 DIAGNOSIS — I10 Essential (primary) hypertension: Secondary | ICD-10-CM | POA: Diagnosis not present

## 2020-05-05 DIAGNOSIS — M1611 Unilateral primary osteoarthritis, right hip: Secondary | ICD-10-CM | POA: Diagnosis not present

## 2020-05-05 DIAGNOSIS — K5900 Constipation, unspecified: Secondary | ICD-10-CM | POA: Diagnosis not present

## 2020-05-05 DIAGNOSIS — R251 Tremor, unspecified: Secondary | ICD-10-CM | POA: Diagnosis not present

## 2020-05-05 DIAGNOSIS — E785 Hyperlipidemia, unspecified: Secondary | ICD-10-CM | POA: Diagnosis not present

## 2020-05-05 DIAGNOSIS — M62461 Contracture of muscle, right lower leg: Secondary | ICD-10-CM | POA: Diagnosis not present

## 2020-05-05 DIAGNOSIS — K219 Gastro-esophageal reflux disease without esophagitis: Secondary | ICD-10-CM | POA: Diagnosis not present

## 2020-05-05 DIAGNOSIS — C719 Malignant neoplasm of brain, unspecified: Secondary | ICD-10-CM | POA: Diagnosis not present

## 2020-05-07 DIAGNOSIS — I10 Essential (primary) hypertension: Secondary | ICD-10-CM | POA: Diagnosis not present

## 2020-05-07 DIAGNOSIS — M1611 Unilateral primary osteoarthritis, right hip: Secondary | ICD-10-CM | POA: Diagnosis not present

## 2020-05-07 DIAGNOSIS — K5900 Constipation, unspecified: Secondary | ICD-10-CM | POA: Diagnosis not present

## 2020-05-07 DIAGNOSIS — E785 Hyperlipidemia, unspecified: Secondary | ICD-10-CM | POA: Diagnosis not present

## 2020-05-07 DIAGNOSIS — M62461 Contracture of muscle, right lower leg: Secondary | ICD-10-CM | POA: Diagnosis not present

## 2020-05-07 DIAGNOSIS — M62462 Contracture of muscle, left lower leg: Secondary | ICD-10-CM | POA: Diagnosis not present

## 2020-05-07 DIAGNOSIS — K219 Gastro-esophageal reflux disease without esophagitis: Secondary | ICD-10-CM | POA: Diagnosis not present

## 2020-05-07 DIAGNOSIS — C719 Malignant neoplasm of brain, unspecified: Secondary | ICD-10-CM | POA: Diagnosis not present

## 2020-05-07 DIAGNOSIS — R251 Tremor, unspecified: Secondary | ICD-10-CM | POA: Diagnosis not present

## 2020-05-08 DIAGNOSIS — R251 Tremor, unspecified: Secondary | ICD-10-CM | POA: Diagnosis not present

## 2020-05-08 DIAGNOSIS — K5900 Constipation, unspecified: Secondary | ICD-10-CM | POA: Diagnosis not present

## 2020-05-08 DIAGNOSIS — K219 Gastro-esophageal reflux disease without esophagitis: Secondary | ICD-10-CM | POA: Diagnosis not present

## 2020-05-08 DIAGNOSIS — M1611 Unilateral primary osteoarthritis, right hip: Secondary | ICD-10-CM | POA: Diagnosis not present

## 2020-05-08 DIAGNOSIS — I10 Essential (primary) hypertension: Secondary | ICD-10-CM | POA: Diagnosis not present

## 2020-05-08 DIAGNOSIS — M62461 Contracture of muscle, right lower leg: Secondary | ICD-10-CM | POA: Diagnosis not present

## 2020-05-08 DIAGNOSIS — M62462 Contracture of muscle, left lower leg: Secondary | ICD-10-CM | POA: Diagnosis not present

## 2020-05-08 DIAGNOSIS — E785 Hyperlipidemia, unspecified: Secondary | ICD-10-CM | POA: Diagnosis not present

## 2020-05-08 DIAGNOSIS — C719 Malignant neoplasm of brain, unspecified: Secondary | ICD-10-CM | POA: Diagnosis not present

## 2020-05-09 DIAGNOSIS — C719 Malignant neoplasm of brain, unspecified: Secondary | ICD-10-CM | POA: Diagnosis not present

## 2020-05-09 DIAGNOSIS — K5900 Constipation, unspecified: Secondary | ICD-10-CM | POA: Diagnosis not present

## 2020-05-09 DIAGNOSIS — M62461 Contracture of muscle, right lower leg: Secondary | ICD-10-CM | POA: Diagnosis not present

## 2020-05-09 DIAGNOSIS — R251 Tremor, unspecified: Secondary | ICD-10-CM | POA: Diagnosis not present

## 2020-05-09 DIAGNOSIS — K219 Gastro-esophageal reflux disease without esophagitis: Secondary | ICD-10-CM | POA: Diagnosis not present

## 2020-05-09 DIAGNOSIS — I10 Essential (primary) hypertension: Secondary | ICD-10-CM | POA: Diagnosis not present

## 2020-05-09 DIAGNOSIS — M1611 Unilateral primary osteoarthritis, right hip: Secondary | ICD-10-CM | POA: Diagnosis not present

## 2020-05-09 DIAGNOSIS — M62462 Contracture of muscle, left lower leg: Secondary | ICD-10-CM | POA: Diagnosis not present

## 2020-05-09 DIAGNOSIS — E785 Hyperlipidemia, unspecified: Secondary | ICD-10-CM | POA: Diagnosis not present

## 2020-05-11 DIAGNOSIS — K5900 Constipation, unspecified: Secondary | ICD-10-CM | POA: Diagnosis not present

## 2020-05-11 DIAGNOSIS — I10 Essential (primary) hypertension: Secondary | ICD-10-CM | POA: Diagnosis not present

## 2020-05-11 DIAGNOSIS — M1611 Unilateral primary osteoarthritis, right hip: Secondary | ICD-10-CM | POA: Diagnosis not present

## 2020-05-11 DIAGNOSIS — M62461 Contracture of muscle, right lower leg: Secondary | ICD-10-CM | POA: Diagnosis not present

## 2020-05-11 DIAGNOSIS — C719 Malignant neoplasm of brain, unspecified: Secondary | ICD-10-CM | POA: Diagnosis not present

## 2020-05-11 DIAGNOSIS — E785 Hyperlipidemia, unspecified: Secondary | ICD-10-CM | POA: Diagnosis not present

## 2020-05-11 DIAGNOSIS — K219 Gastro-esophageal reflux disease without esophagitis: Secondary | ICD-10-CM | POA: Diagnosis not present

## 2020-05-11 DIAGNOSIS — R251 Tremor, unspecified: Secondary | ICD-10-CM | POA: Diagnosis not present

## 2020-05-11 DIAGNOSIS — M62462 Contracture of muscle, left lower leg: Secondary | ICD-10-CM | POA: Diagnosis not present

## 2020-05-12 DIAGNOSIS — S73002A Unspecified subluxation of left hip, initial encounter: Secondary | ICD-10-CM | POA: Diagnosis not present

## 2020-05-15 DIAGNOSIS — M62461 Contracture of muscle, right lower leg: Secondary | ICD-10-CM | POA: Diagnosis not present

## 2020-05-15 DIAGNOSIS — E785 Hyperlipidemia, unspecified: Secondary | ICD-10-CM | POA: Diagnosis not present

## 2020-05-15 DIAGNOSIS — R251 Tremor, unspecified: Secondary | ICD-10-CM | POA: Diagnosis not present

## 2020-05-15 DIAGNOSIS — M62462 Contracture of muscle, left lower leg: Secondary | ICD-10-CM | POA: Diagnosis not present

## 2020-05-15 DIAGNOSIS — K219 Gastro-esophageal reflux disease without esophagitis: Secondary | ICD-10-CM | POA: Diagnosis not present

## 2020-05-15 DIAGNOSIS — I10 Essential (primary) hypertension: Secondary | ICD-10-CM | POA: Diagnosis not present

## 2020-05-15 DIAGNOSIS — C719 Malignant neoplasm of brain, unspecified: Secondary | ICD-10-CM | POA: Diagnosis not present

## 2020-05-15 DIAGNOSIS — M1611 Unilateral primary osteoarthritis, right hip: Secondary | ICD-10-CM | POA: Diagnosis not present

## 2020-05-15 DIAGNOSIS — K5900 Constipation, unspecified: Secondary | ICD-10-CM | POA: Diagnosis not present

## 2020-05-16 DIAGNOSIS — C719 Malignant neoplasm of brain, unspecified: Secondary | ICD-10-CM | POA: Diagnosis not present

## 2020-05-16 DIAGNOSIS — R251 Tremor, unspecified: Secondary | ICD-10-CM | POA: Diagnosis not present

## 2020-05-16 DIAGNOSIS — K219 Gastro-esophageal reflux disease without esophagitis: Secondary | ICD-10-CM | POA: Diagnosis not present

## 2020-05-16 DIAGNOSIS — E785 Hyperlipidemia, unspecified: Secondary | ICD-10-CM | POA: Diagnosis not present

## 2020-05-16 DIAGNOSIS — M1611 Unilateral primary osteoarthritis, right hip: Secondary | ICD-10-CM | POA: Diagnosis not present

## 2020-05-16 DIAGNOSIS — K5900 Constipation, unspecified: Secondary | ICD-10-CM | POA: Diagnosis not present

## 2020-05-16 DIAGNOSIS — M62462 Contracture of muscle, left lower leg: Secondary | ICD-10-CM | POA: Diagnosis not present

## 2020-05-16 DIAGNOSIS — I10 Essential (primary) hypertension: Secondary | ICD-10-CM | POA: Diagnosis not present

## 2020-05-16 DIAGNOSIS — M62461 Contracture of muscle, right lower leg: Secondary | ICD-10-CM | POA: Diagnosis not present

## 2020-05-17 ENCOUNTER — Ambulatory Visit: Payer: Self-pay | Admitting: Student

## 2020-05-17 DIAGNOSIS — S73002A Unspecified subluxation of left hip, initial encounter: Secondary | ICD-10-CM | POA: Insufficient documentation

## 2020-05-19 DIAGNOSIS — M62461 Contracture of muscle, right lower leg: Secondary | ICD-10-CM | POA: Diagnosis not present

## 2020-05-19 DIAGNOSIS — R251 Tremor, unspecified: Secondary | ICD-10-CM | POA: Diagnosis not present

## 2020-05-19 DIAGNOSIS — M1611 Unilateral primary osteoarthritis, right hip: Secondary | ICD-10-CM | POA: Diagnosis not present

## 2020-05-19 DIAGNOSIS — I10 Essential (primary) hypertension: Secondary | ICD-10-CM | POA: Diagnosis not present

## 2020-05-19 DIAGNOSIS — K219 Gastro-esophageal reflux disease without esophagitis: Secondary | ICD-10-CM | POA: Diagnosis not present

## 2020-05-19 DIAGNOSIS — K5900 Constipation, unspecified: Secondary | ICD-10-CM | POA: Diagnosis not present

## 2020-05-19 DIAGNOSIS — M62462 Contracture of muscle, left lower leg: Secondary | ICD-10-CM | POA: Diagnosis not present

## 2020-05-19 DIAGNOSIS — E785 Hyperlipidemia, unspecified: Secondary | ICD-10-CM | POA: Diagnosis not present

## 2020-05-19 DIAGNOSIS — C719 Malignant neoplasm of brain, unspecified: Secondary | ICD-10-CM | POA: Diagnosis not present

## 2020-05-20 DIAGNOSIS — M1611 Unilateral primary osteoarthritis, right hip: Secondary | ICD-10-CM | POA: Diagnosis not present

## 2020-05-20 DIAGNOSIS — M62461 Contracture of muscle, right lower leg: Secondary | ICD-10-CM | POA: Diagnosis not present

## 2020-05-20 DIAGNOSIS — I10 Essential (primary) hypertension: Secondary | ICD-10-CM | POA: Diagnosis not present

## 2020-05-20 DIAGNOSIS — K5900 Constipation, unspecified: Secondary | ICD-10-CM | POA: Diagnosis not present

## 2020-05-20 DIAGNOSIS — K219 Gastro-esophageal reflux disease without esophagitis: Secondary | ICD-10-CM | POA: Diagnosis not present

## 2020-05-20 DIAGNOSIS — E785 Hyperlipidemia, unspecified: Secondary | ICD-10-CM | POA: Diagnosis not present

## 2020-05-20 DIAGNOSIS — M62462 Contracture of muscle, left lower leg: Secondary | ICD-10-CM | POA: Diagnosis not present

## 2020-05-20 DIAGNOSIS — C719 Malignant neoplasm of brain, unspecified: Secondary | ICD-10-CM | POA: Diagnosis not present

## 2020-05-20 DIAGNOSIS — R251 Tremor, unspecified: Secondary | ICD-10-CM | POA: Diagnosis not present

## 2020-05-21 DIAGNOSIS — M62462 Contracture of muscle, left lower leg: Secondary | ICD-10-CM | POA: Diagnosis not present

## 2020-05-21 DIAGNOSIS — M62461 Contracture of muscle, right lower leg: Secondary | ICD-10-CM | POA: Diagnosis not present

## 2020-05-21 DIAGNOSIS — E785 Hyperlipidemia, unspecified: Secondary | ICD-10-CM | POA: Diagnosis not present

## 2020-05-21 DIAGNOSIS — I10 Essential (primary) hypertension: Secondary | ICD-10-CM | POA: Diagnosis not present

## 2020-05-21 DIAGNOSIS — K5900 Constipation, unspecified: Secondary | ICD-10-CM | POA: Diagnosis not present

## 2020-05-21 DIAGNOSIS — M1611 Unilateral primary osteoarthritis, right hip: Secondary | ICD-10-CM | POA: Diagnosis not present

## 2020-05-21 DIAGNOSIS — K219 Gastro-esophageal reflux disease without esophagitis: Secondary | ICD-10-CM | POA: Diagnosis not present

## 2020-05-21 DIAGNOSIS — R251 Tremor, unspecified: Secondary | ICD-10-CM | POA: Diagnosis not present

## 2020-05-21 DIAGNOSIS — C719 Malignant neoplasm of brain, unspecified: Secondary | ICD-10-CM | POA: Diagnosis not present

## 2020-05-22 DIAGNOSIS — K219 Gastro-esophageal reflux disease without esophagitis: Secondary | ICD-10-CM | POA: Diagnosis not present

## 2020-05-22 DIAGNOSIS — C719 Malignant neoplasm of brain, unspecified: Secondary | ICD-10-CM | POA: Diagnosis not present

## 2020-05-22 DIAGNOSIS — K5900 Constipation, unspecified: Secondary | ICD-10-CM | POA: Diagnosis not present

## 2020-05-22 DIAGNOSIS — M62462 Contracture of muscle, left lower leg: Secondary | ICD-10-CM | POA: Diagnosis not present

## 2020-05-22 DIAGNOSIS — I10 Essential (primary) hypertension: Secondary | ICD-10-CM | POA: Diagnosis not present

## 2020-05-22 DIAGNOSIS — M1611 Unilateral primary osteoarthritis, right hip: Secondary | ICD-10-CM | POA: Diagnosis not present

## 2020-05-22 DIAGNOSIS — E785 Hyperlipidemia, unspecified: Secondary | ICD-10-CM | POA: Diagnosis not present

## 2020-05-22 DIAGNOSIS — R251 Tremor, unspecified: Secondary | ICD-10-CM | POA: Diagnosis not present

## 2020-05-22 DIAGNOSIS — M62461 Contracture of muscle, right lower leg: Secondary | ICD-10-CM | POA: Diagnosis not present

## 2020-05-24 DIAGNOSIS — E785 Hyperlipidemia, unspecified: Secondary | ICD-10-CM | POA: Diagnosis not present

## 2020-05-24 DIAGNOSIS — C719 Malignant neoplasm of brain, unspecified: Secondary | ICD-10-CM | POA: Diagnosis not present

## 2020-05-24 DIAGNOSIS — M62461 Contracture of muscle, right lower leg: Secondary | ICD-10-CM | POA: Diagnosis not present

## 2020-05-24 DIAGNOSIS — M1611 Unilateral primary osteoarthritis, right hip: Secondary | ICD-10-CM | POA: Diagnosis not present

## 2020-05-24 DIAGNOSIS — K5900 Constipation, unspecified: Secondary | ICD-10-CM | POA: Diagnosis not present

## 2020-05-24 DIAGNOSIS — R251 Tremor, unspecified: Secondary | ICD-10-CM | POA: Diagnosis not present

## 2020-05-24 DIAGNOSIS — K219 Gastro-esophageal reflux disease without esophagitis: Secondary | ICD-10-CM | POA: Diagnosis not present

## 2020-05-24 DIAGNOSIS — M62462 Contracture of muscle, left lower leg: Secondary | ICD-10-CM | POA: Diagnosis not present

## 2020-05-24 DIAGNOSIS — I10 Essential (primary) hypertension: Secondary | ICD-10-CM | POA: Diagnosis not present

## 2020-05-27 ENCOUNTER — Encounter (HOSPITAL_COMMUNITY): Payer: Self-pay

## 2020-05-27 DIAGNOSIS — K219 Gastro-esophageal reflux disease without esophagitis: Secondary | ICD-10-CM | POA: Diagnosis not present

## 2020-05-27 DIAGNOSIS — I10 Essential (primary) hypertension: Secondary | ICD-10-CM | POA: Diagnosis not present

## 2020-05-27 DIAGNOSIS — R251 Tremor, unspecified: Secondary | ICD-10-CM | POA: Diagnosis not present

## 2020-05-27 DIAGNOSIS — K5900 Constipation, unspecified: Secondary | ICD-10-CM | POA: Diagnosis not present

## 2020-05-27 DIAGNOSIS — M1611 Unilateral primary osteoarthritis, right hip: Secondary | ICD-10-CM | POA: Diagnosis not present

## 2020-05-27 DIAGNOSIS — M62461 Contracture of muscle, right lower leg: Secondary | ICD-10-CM | POA: Diagnosis not present

## 2020-05-27 DIAGNOSIS — M62462 Contracture of muscle, left lower leg: Secondary | ICD-10-CM | POA: Diagnosis not present

## 2020-05-27 DIAGNOSIS — C719 Malignant neoplasm of brain, unspecified: Secondary | ICD-10-CM | POA: Diagnosis not present

## 2020-05-27 DIAGNOSIS — E785 Hyperlipidemia, unspecified: Secondary | ICD-10-CM | POA: Diagnosis not present

## 2020-05-27 NOTE — Progress Notes (Signed)
Surgical Instructions    Your procedure is scheduled on Friday April 22nd.   Report to [facility] Main Entrance "A" at 0600 A.M., then check in with the Admitting office.  Call this number if you have problems the morning of surgery:  [phone]   If you have any questions prior to your surgery date call [phone]: Open Monday-Friday 8am-4pm    Remember:  Do not eat after midnight the night before your surgery  You may drink clear liquids until 0500 the morning of your surgery.   Clear liquids allowed are: Water, Non-Citrus Juices (without pulp), Carbonated Beverages, Clear Tea, Black Coffee Only, and Gatorade  Patient Instructions  . The night before surgery:  o No food after midnight. ONLY clear liquids after midnight  . The day of surgery (if you do NOT have diabetes):  o Drink ONE (1) Pre-Surgery Clear Ensure by 0500 the morning of surgery. Drink in one sitting. Do not sip.  o This drink was given to you during your hospital  pre-op appointment visit.  o Nothing else to drink after completing the  Pre-Surgery Clear Ensure.         If you have questions, please contact your surgeon's office.     Take these medicines the morning of surgery with A SIP OF WATER   Atenolol (TENORMIN)  Fenofibrate  Omeprazole (PRILOSEC)     If needed take the following medications:  Acetaminophen (TYLENOL)  Fluticasone (FLONASE)  Methocarbamol (ROBAXIN  TraMADol (ULTRAM)  Eye Drops   Follow your surgeon's instructions on when to stop Aspirin and Enoxaparin (Lovenox).  If no instructions were given by your surgeon then you will need to call the office to get those instructions.      As of today, do not take Aleve, Naproxen, Ibuprofen, Motrin, Advil, Goody's, BC's, all herbal medications, fish oil, and all vitamins.                     Do not wear jewelry.            Do not wear lotions, powders, colognes, or deodorant.            Do not shave 48 hours prior to surgery.  Men may  shave face and neck.            Do not bring valuables to the hospital.            [facility] is not responsible for any belongings or valuables.  Do NOT Smoke (Tobacco/Vaping) or drink Alcohol 24 hours prior to your procedure If you use a CPAP at night, you may bring all equipment for your overnight stay.   Contacts, glasses, dentures or partials may not be worn into surgery, please bring cases for these belongings   For patients admitted to the hospital, discharge time will be determined by your treatment team.   Patients discharged the day of surgery will not be allowed to drive home, and someone needs to stay with them for 24 hours.    Special instructions:   [place]- Preparing For Surgery  Before surgery, you can play an important role. Because skin is not sterile, your skin needs to be as free of germs as possible. You can reduce the number of germs on your skin by washing with CHG (chlorahexidine gluconate) Soap before surgery.  CHG is an antiseptic cleaner which kills germs and bonds with the skin to continue killing germs even after washing.  Oral Hygiene is also important to reduce your risk of infection.  Remember - BRUSH YOUR TEETH THE MORNING OF SURGERY WITH YOUR REGULAR TOOTHPASTE  Please do not use if you have an allergy to CHG or antibacterial soaps. If your skin becomes reddened/irritated stop using the CHG.  Do not shave (including legs and underarms) for at least 48 hours prior to first CHG shower. It is OK to shave your face.  Please follow these instructions carefully.   1. Shower the NIGHT BEFORE SURGERY and the MORNING OF SURGERY  2. If you chose to wash your hair, wash your hair first as usual with your normal shampoo.  3. After you shampoo, rinse your hair and body thoroughly to remove the shampoo.  4. Wash Face and genitals (private parts) with your normal soap.   5.  Shower the NIGHT BEFORE SURGERY and the MORNING OF SURGERY with CHG Soap.    6. Use CHG Soap as you would any other liquid soap. You can apply CHG directly to the skin and wash gently with a scrungie or a clean washcloth.   7. Apply the CHG Soap to your body ONLY FROM THE NECK DOWN.  Do not use on open wounds or open sores. Avoid contact with your eyes, ears, mouth and genitals (private parts). Wash Face and genitals (private parts)  with your normal soap.   8. Wash thoroughly, paying special attention to the area where your surgery will be performed.  9. Thoroughly rinse your body with warm water from the neck down.  10. DO NOT shower/wash with your normal soap after using and rinsing off the CHG Soap.  11. Pat yourself dry with a CLEAN TOWEL.  12. Wear CLEAN PAJAMAS to bed the night before surgery  13. Place CLEAN SHEETS on your bed the night before your surgery  14. DO NOT SLEEP WITH PETS.   Day of Surgery: Take a shower with CHG soap. Wear Clean/Comfortable clothing the morning of surgery Do not apply any deodorants/lotions.   Remember to brush your teeth WITH YOUR REGULAR TOOTHPASTE.   Please read over the following fact sheets that you were given.

## 2020-05-27 NOTE — Progress Notes (Signed)
error 

## 2020-05-28 ENCOUNTER — Encounter (HOSPITAL_COMMUNITY): Payer: Self-pay

## 2020-05-28 ENCOUNTER — Encounter (HOSPITAL_COMMUNITY)
Admission: RE | Admit: 2020-05-28 | Discharge: 2020-05-28 | Disposition: A | Discharge status: Home / Self Care | Payer: Medicare HMO | Source: Ambulatory Visit | Attending: Student | Admitting: Student

## 2020-05-28 ENCOUNTER — Other Ambulatory Visit: Payer: Self-pay

## 2020-05-28 DIAGNOSIS — K219 Gastro-esophageal reflux disease without esophagitis: Secondary | ICD-10-CM | POA: Diagnosis present

## 2020-05-28 DIAGNOSIS — Z87891 Personal history of nicotine dependence: Secondary | ICD-10-CM | POA: Diagnosis not present

## 2020-05-28 DIAGNOSIS — M62838 Other muscle spasm: Secondary | ICD-10-CM | POA: Diagnosis present

## 2020-05-28 DIAGNOSIS — Z86011 Personal history of benign neoplasm of the brain: Secondary | ICD-10-CM | POA: Diagnosis not present

## 2020-05-28 DIAGNOSIS — Z23 Encounter for immunization: Secondary | ICD-10-CM | POA: Diagnosis not present

## 2020-05-28 DIAGNOSIS — Z91048 Other nonmedicinal substance allergy status: Secondary | ICD-10-CM | POA: Diagnosis not present

## 2020-05-28 DIAGNOSIS — M6281 Muscle weakness (generalized): Secondary | ICD-10-CM | POA: Diagnosis not present

## 2020-05-28 DIAGNOSIS — G8929 Other chronic pain: Secondary | ICD-10-CM | POA: Diagnosis present

## 2020-05-28 DIAGNOSIS — M25552 Pain in left hip: Secondary | ICD-10-CM | POA: Diagnosis not present

## 2020-05-28 DIAGNOSIS — Z471 Aftercare following joint replacement surgery: Secondary | ICD-10-CM | POA: Diagnosis not present

## 2020-05-28 DIAGNOSIS — Z961 Presence of intraocular lens: Secondary | ICD-10-CM | POA: Diagnosis not present

## 2020-05-28 DIAGNOSIS — S73002D Unspecified subluxation of left hip, subsequent encounter: Secondary | ICD-10-CM | POA: Diagnosis not present

## 2020-05-28 DIAGNOSIS — Z880 Allergy status to penicillin: Secondary | ICD-10-CM | POA: Diagnosis not present

## 2020-05-28 DIAGNOSIS — W19XXXA Unspecified fall, initial encounter: Secondary | ICD-10-CM | POA: Diagnosis present

## 2020-05-28 DIAGNOSIS — S32421D Displaced fracture of posterior wall of right acetabulum, subsequent encounter for fracture with routine healing: Secondary | ICD-10-CM | POA: Diagnosis not present

## 2020-05-28 DIAGNOSIS — C719 Malignant neoplasm of brain, unspecified: Secondary | ICD-10-CM | POA: Diagnosis not present

## 2020-05-28 DIAGNOSIS — Z01818 Encounter for other preprocedural examination: Secondary | ICD-10-CM | POA: Insufficient documentation

## 2020-05-28 DIAGNOSIS — Z79899 Other long term (current) drug therapy: Secondary | ICD-10-CM | POA: Diagnosis not present

## 2020-05-28 DIAGNOSIS — R251 Tremor, unspecified: Secondary | ICD-10-CM | POA: Diagnosis not present

## 2020-05-28 DIAGNOSIS — R279 Unspecified lack of coordination: Secondary | ICD-10-CM | POA: Diagnosis not present

## 2020-05-28 DIAGNOSIS — R5381 Other malaise: Secondary | ICD-10-CM | POA: Diagnosis not present

## 2020-05-28 DIAGNOSIS — Z743 Need for continuous supervision: Secondary | ICD-10-CM | POA: Diagnosis not present

## 2020-05-28 DIAGNOSIS — Z20822 Contact with and (suspected) exposure to covid-19: Secondary | ICD-10-CM | POA: Insufficient documentation

## 2020-05-28 DIAGNOSIS — E785 Hyperlipidemia, unspecified: Secondary | ICD-10-CM | POA: Diagnosis present

## 2020-05-28 DIAGNOSIS — I10 Essential (primary) hypertension: Secondary | ICD-10-CM | POA: Diagnosis present

## 2020-05-28 DIAGNOSIS — Z96642 Presence of left artificial hip joint: Secondary | ICD-10-CM | POA: Diagnosis not present

## 2020-05-28 DIAGNOSIS — M62461 Contracture of muscle, right lower leg: Secondary | ICD-10-CM | POA: Diagnosis not present

## 2020-05-28 DIAGNOSIS — Z7401 Bed confinement status: Secondary | ICD-10-CM | POA: Diagnosis not present

## 2020-05-28 DIAGNOSIS — Z7982 Long term (current) use of aspirin: Secondary | ICD-10-CM | POA: Diagnosis not present

## 2020-05-28 DIAGNOSIS — S73002A Unspecified subluxation of left hip, initial encounter: Secondary | ICD-10-CM | POA: Diagnosis present

## 2020-05-28 DIAGNOSIS — D62 Acute posthemorrhagic anemia: Secondary | ICD-10-CM | POA: Diagnosis not present

## 2020-05-28 DIAGNOSIS — M62462 Contracture of muscle, left lower leg: Secondary | ICD-10-CM | POA: Diagnosis not present

## 2020-05-28 DIAGNOSIS — S72142A Displaced intertrochanteric fracture of left femur, initial encounter for closed fracture: Secondary | ICD-10-CM | POA: Diagnosis not present

## 2020-05-28 DIAGNOSIS — K5900 Constipation, unspecified: Secondary | ICD-10-CM | POA: Diagnosis not present

## 2020-05-28 DIAGNOSIS — M1611 Unilateral primary osteoarthritis, right hip: Secondary | ICD-10-CM | POA: Diagnosis not present

## 2020-05-28 HISTORY — DX: Anxiety disorder, unspecified: F41.9

## 2020-05-28 LAB — CBC
HCT: 44.9 % (ref 39.0–52.0)
Hemoglobin: 14.4 g/dL (ref 13.0–17.0)
MCH: 30.2 pg (ref 26.0–34.0)
MCHC: 32.1 g/dL (ref 30.0–36.0)
MCV: 94.1 fL (ref 80.0–100.0)
Platelets: 294 10*3/uL (ref 150–400)
RBC: 4.77 MIL/uL (ref 4.22–5.81)
RDW: 13.1 % (ref 11.5–15.5)
WBC: 8.9 10*3/uL (ref 4.0–10.5)
nRBC: 0 % (ref 0.0–0.2)

## 2020-05-28 LAB — BASIC METABOLIC PANEL
Anion gap: 10 (ref 5–15)
BUN: 20 mg/dL (ref 6–20)
CO2: 29 mmol/L (ref 22–32)
Calcium: 9.9 mg/dL (ref 8.9–10.3)
Chloride: 101 mmol/L (ref 98–111)
Creatinine, Ser: 0.76 mg/dL (ref 0.61–1.24)
GFR, Estimated: >60 mL/min (ref >60)
Glucose, Bld: 107 mg/dL — ABNORMAL HIGH (ref 70–99)
Potassium: 4.2 mmol/L (ref 3.5–5.1)
Sodium: 140 mmol/L (ref 135–145)

## 2020-05-28 LAB — TYPE AND SCREEN
ABO/RH(D): A POS
Antibody Screen: NEGATIVE

## 2020-05-28 LAB — SURGICAL PCR SCREEN
MRSA, PCR: NEGATIVE
Staphylococcus aureus: NEGATIVE

## 2020-05-28 LAB — SARS CORONAVIRUS 2 (TAT 6-24 HRS): SARS Coronavirus 2: NEGATIVE

## 2020-05-28 NOTE — Progress Notes (Signed)
Surgical Instructions    Your procedure is scheduled on Friday April 22nd.   Report to [facility] Main Entrance "A" at 0600 A.M., then check in with the Admitting office.  Call this number if you have problems the morning of surgery:  [phone]   If you have any questions prior to your surgery date call [phone]: Open Monday-Friday 8am-4pm    Remember:  Do not eat after midnight the night before your surgery  You may drink clear liquids until 0500 the morning of your surgery.   Clear liquids allowed are: Water, Non-Citrus Juices (without pulp), Carbonated Beverages, Clear Tea, Black Coffee Only, and Gatorade  Patient Instructions  . The night before surgery:  o No food after midnight. ONLY clear liquids after midnight  . The day of surgery (if you do NOT have diabetes):  o Drink ONE (1) Pre-Surgery Clear Ensure by 0500 the morning of surgery. Drink in one sitting. Do not sip.  o This drink was given to you during your hospital  pre-op appointment visit.  o Nothing else to drink after completing the  Pre-Surgery Clear Ensure.         If you have questions, please contact your surgeon's office.     Take these medicines the morning of surgery with A SIP OF WATER   Atenolol (TENORMIN)  Fenofibrate  Omeprazole (PRILOSEC)     If needed take the following medications:  Acetaminophen (TYLENOL)  Fluticasone (FLONASE)  Methocarbamol (ROBAXIN  TraMADol (ULTRAM)  Eye Drops   Follow your surgeon's instructions on when to stop Aspirin and Enoxaparin (Lovenox).  If no instructions were given by your surgeon then you will need to call the office to get those instructions.      As of today, do not take Aleve, Naproxen, Ibuprofen, Motrin, Advil, Goody's, BC's, all herbal medications, fish oil, and all vitamins.                     Do not wear jewelry.            Do not wear lotions, powders, colognes, or deodorant.            Do not shave 48 hours prior to surgery.  Men may  shave face and neck.            Do not bring valuables to the hospital.            [facility] is not responsible for any belongings or valuables.  Do NOT Smoke (Tobacco/Vaping) or drink Alcohol 24 hours prior to your procedure If you use a CPAP at night, you may bring all equipment for your overnight stay.   Contacts, glasses, dentures or partials may not be worn into surgery, please bring cases for these belongings   For patients admitted to the hospital, discharge time will be determined by your treatment team.   Patients discharged the day of surgery will not be allowed to drive home, and someone needs to stay with them for 24 hours.    Special instructions:    Oral Hygiene is also important to reduce your risk of infection.  Remember - BRUSH YOUR TEETH THE MORNING OF SURGERY WITH YOUR REGULAR TOOTHPASTE   Day of Surgery:  Wear Clean/Comfortable clothing the morning of surgery Do not apply any deodorants/lotions.   Remember to brush your teeth WITH YOUR REGULAR TOOTHPASTE.   Please read over the following fact sheets that you were given.

## 2020-05-29 DIAGNOSIS — M1611 Unilateral primary osteoarthritis, right hip: Secondary | ICD-10-CM | POA: Diagnosis not present

## 2020-05-29 DIAGNOSIS — E785 Hyperlipidemia, unspecified: Secondary | ICD-10-CM | POA: Diagnosis not present

## 2020-05-29 DIAGNOSIS — C719 Malignant neoplasm of brain, unspecified: Secondary | ICD-10-CM | POA: Diagnosis not present

## 2020-05-29 DIAGNOSIS — I10 Essential (primary) hypertension: Secondary | ICD-10-CM | POA: Diagnosis not present

## 2020-05-29 DIAGNOSIS — K219 Gastro-esophageal reflux disease without esophagitis: Secondary | ICD-10-CM | POA: Diagnosis not present

## 2020-05-29 DIAGNOSIS — M62461 Contracture of muscle, right lower leg: Secondary | ICD-10-CM | POA: Diagnosis not present

## 2020-05-29 DIAGNOSIS — K5900 Constipation, unspecified: Secondary | ICD-10-CM | POA: Diagnosis not present

## 2020-05-29 DIAGNOSIS — R251 Tremor, unspecified: Secondary | ICD-10-CM | POA: Diagnosis not present

## 2020-05-29 DIAGNOSIS — M62462 Contracture of muscle, left lower leg: Secondary | ICD-10-CM | POA: Diagnosis not present

## 2020-05-29 NOTE — Anesthesia Preprocedure Evaluation (Addendum)
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    GI/Hepatic Neg liver ROS, GERD  ,  Endo/Other  hyperlipidemia  Renal/GU negative Renal ROS  negative genitourinary   Musculoskeletal contractures   Abdominal Normal abdominal exam  (+)   Peds negative pediatric ROS (+)  Hematology negative hematology ROS (+)   Anesthesia Other Findings   Reproductive/Obstetrics negative OB ROS                            Anesthesia Physical Anesthesia Plan  ASA: III  Anesthesia Plan: General   Post-op Pain Management:    Induction:   PONV Risk Score and Plan: 2 and Ondansetron, Dexamethasone, Treatment may vary due to age or medical condition and Midazolam  Airway Management Planned: Oral ETT  Additional Equipment: None  Intra-op Plan:   Post-operative Plan: Extubation in OR  Informed Consent: I have reviewed the patients History and Physical, chart, labs and discussed the procedure including the risks, benefits and alternatives for the proposed anesthesia with the patient or authorized representative who has indicated his/her understanding and acceptance.     Dental advisory given  Plan Discussed with: Surgeon, Anesthesiologist and CRNA  Anesthesia Plan Comments:        Anesthesia Quick Evaluation

## 2020-05-29 NOTE — Progress Notes (Signed)
Patient's sister Joanie Coddington) will accompany patient to his scheduled surgery on 05/30/20 instead of his mother Kapono Luhn).

## 2020-05-30 ENCOUNTER — Other Ambulatory Visit: Payer: Self-pay

## 2020-05-30 ENCOUNTER — Inpatient Hospital Stay (HOSPITAL_COMMUNITY): Payer: Medicare HMO | Admitting: Physician Assistant

## 2020-05-30 ENCOUNTER — Inpatient Hospital Stay (HOSPITAL_COMMUNITY): Payer: Medicare HMO

## 2020-05-30 ENCOUNTER — Inpatient Hospital Stay (HOSPITAL_COMMUNITY): Payer: Medicare HMO | Admitting: Anesthesiology

## 2020-05-30 ENCOUNTER — Encounter (HOSPITAL_COMMUNITY): Payer: Self-pay | Admitting: Student

## 2020-05-30 ENCOUNTER — Inpatient Hospital Stay (HOSPITAL_COMMUNITY)
Admission: RE | Admit: 2020-05-30 | Discharge: 2020-06-06 | DRG: 464 | Disposition: A | Discharge status: Skilled Nursing Facility | Payer: Medicare HMO | Attending: Student | Admitting: Student

## 2020-05-30 ENCOUNTER — Encounter (HOSPITAL_COMMUNITY)
Admission: RE | Disposition: A | Discharge status: Skilled Nursing Facility | Payer: Self-pay | Source: Home / Self Care | Attending: Student

## 2020-05-30 DIAGNOSIS — Z20822 Contact with and (suspected) exposure to covid-19: Secondary | ICD-10-CM | POA: Diagnosis present

## 2020-05-30 DIAGNOSIS — Z23 Encounter for immunization: Secondary | ICD-10-CM

## 2020-05-30 DIAGNOSIS — G8929 Other chronic pain: Secondary | ICD-10-CM | POA: Diagnosis present

## 2020-05-30 DIAGNOSIS — Z7982 Long term (current) use of aspirin: Secondary | ICD-10-CM | POA: Diagnosis not present

## 2020-05-30 DIAGNOSIS — Z86011 Personal history of benign neoplasm of the brain: Secondary | ICD-10-CM | POA: Diagnosis not present

## 2020-05-30 DIAGNOSIS — Z87891 Personal history of nicotine dependence: Secondary | ICD-10-CM

## 2020-05-30 DIAGNOSIS — Z79899 Other long term (current) drug therapy: Secondary | ICD-10-CM | POA: Diagnosis not present

## 2020-05-30 DIAGNOSIS — S72142A Displaced intertrochanteric fracture of left femur, initial encounter for closed fracture: Secondary | ICD-10-CM | POA: Diagnosis not present

## 2020-05-30 DIAGNOSIS — S73002A Unspecified subluxation of left hip, initial encounter: Secondary | ICD-10-CM | POA: Diagnosis present

## 2020-05-30 DIAGNOSIS — Z7401 Bed confinement status: Secondary | ICD-10-CM | POA: Diagnosis not present

## 2020-05-30 DIAGNOSIS — Z91048 Other nonmedicinal substance allergy status: Secondary | ICD-10-CM | POA: Diagnosis not present

## 2020-05-30 DIAGNOSIS — I10 Essential (primary) hypertension: Secondary | ICD-10-CM | POA: Diagnosis present

## 2020-05-30 DIAGNOSIS — M25552 Pain in left hip: Secondary | ICD-10-CM | POA: Diagnosis present

## 2020-05-30 DIAGNOSIS — K219 Gastro-esophageal reflux disease without esophagitis: Secondary | ICD-10-CM | POA: Diagnosis not present

## 2020-05-30 DIAGNOSIS — W19XXXA Unspecified fall, initial encounter: Secondary | ICD-10-CM | POA: Diagnosis present

## 2020-05-30 DIAGNOSIS — E785 Hyperlipidemia, unspecified: Secondary | ICD-10-CM | POA: Diagnosis present

## 2020-05-30 DIAGNOSIS — M62838 Other muscle spasm: Secondary | ICD-10-CM | POA: Diagnosis present

## 2020-05-30 DIAGNOSIS — Z880 Allergy status to penicillin: Secondary | ICD-10-CM

## 2020-05-30 DIAGNOSIS — D62 Acute posthemorrhagic anemia: Secondary | ICD-10-CM | POA: Diagnosis not present

## 2020-05-30 DIAGNOSIS — T148XXA Other injury of unspecified body region, initial encounter: Secondary | ICD-10-CM

## 2020-05-30 DIAGNOSIS — Z96642 Presence of left artificial hip joint: Secondary | ICD-10-CM | POA: Diagnosis not present

## 2020-05-30 DIAGNOSIS — Z471 Aftercare following joint replacement surgery: Secondary | ICD-10-CM | POA: Diagnosis not present

## 2020-05-30 DIAGNOSIS — Z419 Encounter for procedure for purposes other than remedying health state, unspecified: Secondary | ICD-10-CM

## 2020-05-30 HISTORY — PX: GIRDLESTONE ARTHROPLASTY: SHX5550

## 2020-05-30 LAB — CBC
HCT: 35.8 % — ABNORMAL LOW (ref 39.0–52.0)
Hemoglobin: 11.2 g/dL — ABNORMAL LOW (ref 13.0–17.0)
MCH: 30.1 pg (ref 26.0–34.0)
MCHC: 31.3 g/dL (ref 30.0–36.0)
MCV: 96.2 fL (ref 80.0–100.0)
Platelets: 256 10*3/uL (ref 150–400)
RBC: 3.72 MIL/uL — ABNORMAL LOW (ref 4.22–5.81)
RDW: 13.2 % (ref 11.5–15.5)
WBC: 16.7 10*3/uL — ABNORMAL HIGH (ref 4.0–10.5)
nRBC: 0 % (ref 0.0–0.2)

## 2020-05-30 LAB — CREATININE, SERUM
Creatinine, Ser: 0.71 mg/dL (ref 0.61–1.24)
GFR, Estimated: >60 mL/min (ref >60)

## 2020-05-30 LAB — ABO/RH: ABO/RH(D): A POS

## 2020-05-30 SURGERY — ARTHROPLASTY, HIP, GIRDLESTONE
Anesthesia: General | Site: Hip | Laterality: Left

## 2020-05-30 MED ORDER — TRANEXAMIC ACID-NACL 1000-0.7 MG/100ML-% IV SOLN
1000.0000 mg | Freq: Once | INTRAVENOUS | Status: AC
  Administered 2020-05-30: 1000 mg via INTRAVENOUS
  Filled 2020-05-30: qty 100

## 2020-05-30 MED ORDER — ONDANSETRON HCL 4 MG/2ML IJ SOLN
INTRAMUSCULAR | Status: DC | PRN
  Administered 2020-05-30: 4 mg via INTRAVENOUS

## 2020-05-30 MED ORDER — HYDROMORPHONE HCL 1 MG/ML IJ SOLN
0.5000 mg | INTRAMUSCULAR | Status: DC | PRN
  Administered 2020-05-30 – 2020-06-06 (×18): 1 mg via INTRAVENOUS
  Filled 2020-05-30 (×19): qty 1

## 2020-05-30 MED ORDER — DEXAMETHASONE SODIUM PHOSPHATE 10 MG/ML IJ SOLN
INTRAMUSCULAR | Status: AC
  Filled 2020-05-30: qty 1

## 2020-05-30 MED ORDER — FENTANYL CITRATE (PF) 100 MCG/2ML IJ SOLN
25.0000 ug | INTRAMUSCULAR | Status: DC | PRN
  Administered 2020-05-30: 25 ug via INTRAVENOUS

## 2020-05-30 MED ORDER — OXYCODONE HCL 5 MG PO TABS
10.0000 mg | ORAL_TABLET | ORAL | Status: DC | PRN
  Administered 2020-05-30 – 2020-06-02 (×9): 15 mg via ORAL
  Administered 2020-06-03: 10 mg via ORAL
  Administered 2020-06-03: 15 mg via ORAL
  Administered 2020-06-03: 10 mg via ORAL
  Administered 2020-06-03 – 2020-06-04 (×2): 15 mg via ORAL
  Filled 2020-05-30 (×8): qty 3
  Filled 2020-05-30: qty 2
  Filled 2020-05-30 (×4): qty 3

## 2020-05-30 MED ORDER — VANCOMYCIN HCL 1000 MG IV SOLR
INTRAVENOUS | Status: AC
  Filled 2020-05-30: qty 1000

## 2020-05-30 MED ORDER — ONDANSETRON HCL 4 MG/2ML IJ SOLN
INTRAMUSCULAR | Status: AC
  Filled 2020-05-30: qty 2

## 2020-05-30 MED ORDER — ROCURONIUM BROMIDE 10 MG/ML (PF) SYRINGE
PREFILLED_SYRINGE | INTRAVENOUS | Status: DC | PRN
  Administered 2020-05-30: 70 mg via INTRAVENOUS

## 2020-05-30 MED ORDER — ACETAMINOPHEN 500 MG PO TABS
1000.0000 mg | ORAL_TABLET | Freq: Three times a day (TID) | ORAL | Status: DC
  Administered 2020-05-30 – 2020-06-06 (×22): 1000 mg via ORAL
  Filled 2020-05-30 (×23): qty 2

## 2020-05-30 MED ORDER — VITAMIN D (ERGOCALCIFEROL) 1.25 MG (50000 UNIT) PO CAPS
50000.0000 [IU] | ORAL_CAPSULE | ORAL | Status: DC
  Administered 2020-06-05: 50000 [IU] via ORAL
  Filled 2020-05-30: qty 1

## 2020-05-30 MED ORDER — PROPOFOL 10 MG/ML IV BOLUS
INTRAVENOUS | Status: DC | PRN
  Administered 2020-05-30: 140 mg via INTRAVENOUS

## 2020-05-30 MED ORDER — FENTANYL CITRATE (PF) 100 MCG/2ML IJ SOLN
INTRAMUSCULAR | Status: AC
  Administered 2020-05-30: 50 ug via INTRAVENOUS
  Filled 2020-05-30: qty 2

## 2020-05-30 MED ORDER — VITAMIN B-12 1000 MCG PO TABS
1000.0000 ug | ORAL_TABLET | Freq: Every day | ORAL | Status: DC
  Administered 2020-05-30 – 2020-06-06 (×8): 1000 ug via ORAL
  Filled 2020-05-30 (×8): qty 1

## 2020-05-30 MED ORDER — PHENYLEPHRINE HCL-NACL 10-0.9 MG/250ML-% IV SOLN
INTRAVENOUS | Status: DC | PRN
  Administered 2020-05-30: 25 ug/min via INTRAVENOUS

## 2020-05-30 MED ORDER — SUGAMMADEX SODIUM 200 MG/2ML IV SOLN
INTRAVENOUS | Status: DC | PRN
  Administered 2020-05-30: 200 mg via INTRAVENOUS

## 2020-05-30 MED ORDER — CARBOXYMETHYLCELLULOSE SODIUM 0.5 % OP SOLN: 2.0000 [drp] | Freq: Four times a day (QID) | OPHTHALMIC | Status: DC | PRN

## 2020-05-30 MED ORDER — FENTANYL CITRATE (PF) 250 MCG/5ML IJ SOLN
INTRAMUSCULAR | Status: DC | PRN
  Administered 2020-05-30: 100 ug via INTRAVENOUS
  Administered 2020-05-30: 50 ug via INTRAVENOUS
  Administered 2020-05-30: 100 ug via INTRAVENOUS

## 2020-05-30 MED ORDER — ENOXAPARIN SODIUM 40 MG/0.4ML ~~LOC~~ SOLN
40.0000 mg | SUBCUTANEOUS | Status: DC
  Administered 2020-05-31 – 2020-06-05 (×6): 40 mg via SUBCUTANEOUS
  Filled 2020-05-30 (×6): qty 0.4

## 2020-05-30 MED ORDER — OXYCODONE HCL 5 MG PO TABS: 5.0000 mg | ORAL_TABLET | Freq: Once | ORAL | Status: DC | PRN

## 2020-05-30 MED ORDER — SODIUM CHLORIDE 0.9 % IR SOLN
Status: DC | PRN
  Administered 2020-05-30: 3000 mL

## 2020-05-30 MED ORDER — VANCOMYCIN HCL 1000 MG IV SOLR
INTRAVENOUS | Status: DC | PRN
  Administered 2020-05-30: 1000 mg

## 2020-05-30 MED ORDER — ASPIRIN EC 81 MG PO TBEC
81.0000 mg | DELAYED_RELEASE_TABLET | Freq: Every day | ORAL | Status: DC
  Administered 2020-05-31 – 2020-06-06 (×7): 81 mg via ORAL
  Filled 2020-05-30 (×7): qty 1

## 2020-05-30 MED ORDER — ONDANSETRON HCL 4 MG/2ML IJ SOLN: 4.0000 mg | Freq: Four times a day (QID) | INTRAMUSCULAR | Status: DC | PRN

## 2020-05-30 MED ORDER — FENTANYL CITRATE (PF) 250 MCG/5ML IJ SOLN
INTRAMUSCULAR | Status: AC
  Filled 2020-05-30: qty 5

## 2020-05-30 MED ORDER — CEFAZOLIN SODIUM-DEXTROSE 2-4 GM/100ML-% IV SOLN
2.0000 g | Freq: Three times a day (TID) | INTRAVENOUS | Status: AC
  Administered 2020-05-30 – 2020-05-31 (×3): 2 g via INTRAVENOUS
  Filled 2020-05-30 (×3): qty 100

## 2020-05-30 MED ORDER — METOCLOPRAMIDE HCL 5 MG PO TABS: 5.0000 mg | ORAL_TABLET | Freq: Three times a day (TID) | ORAL | Status: DC | PRN

## 2020-05-30 MED ORDER — CHLORHEXIDINE GLUCONATE 0.12 % MT SOLN
15.0000 mL | Freq: Once | OROMUCOSAL | Status: AC
  Administered 2020-05-30: 15 mL via OROMUCOSAL
  Filled 2020-05-30: qty 15

## 2020-05-30 MED ORDER — BUPIVACAINE HCL (PF) 0.5 % IJ SOLN
INTRAMUSCULAR | Status: DC | PRN
  Administered 2020-05-30: 30 mL

## 2020-05-30 MED ORDER — 0.9 % SODIUM CHLORIDE (POUR BTL) OPTIME
TOPICAL | Status: DC | PRN
  Administered 2020-05-30: 1000 mL

## 2020-05-30 MED ORDER — METOCLOPRAMIDE HCL 5 MG/ML IJ SOLN: 5.0000 mg | Freq: Three times a day (TID) | INTRAMUSCULAR | Status: DC | PRN

## 2020-05-30 MED ORDER — ONDANSETRON HCL 4 MG/2ML IJ SOLN: 4.0000 mg | Freq: Once | INTRAMUSCULAR | Status: DC | PRN

## 2020-05-30 MED ORDER — ROCURONIUM BROMIDE 10 MG/ML (PF) SYRINGE
PREFILLED_SYRINGE | INTRAVENOUS | Status: AC
  Filled 2020-05-30: qty 10

## 2020-05-30 MED ORDER — CALCIUM CARBONATE ANTACID 500 MG PO CHEW: 500.0000 mg | CHEWABLE_TABLET | Freq: Three times a day (TID) | ORAL | Status: DC | PRN

## 2020-05-30 MED ORDER — DOCUSATE SODIUM 100 MG PO CAPS
100.0000 mg | ORAL_CAPSULE | Freq: Two times a day (BID) | ORAL | Status: DC
  Administered 2020-05-30 – 2020-06-06 (×14): 100 mg via ORAL
  Filled 2020-05-30 (×15): qty 1

## 2020-05-30 MED ORDER — FLUTICASONE PROPIONATE 50 MCG/ACT NA SUSP: 1.0000 | Freq: Every day | NASAL | Status: DC | PRN

## 2020-05-30 MED ORDER — OXYCODONE HCL 5 MG PO TABS
5.0000 mg | ORAL_TABLET | ORAL | Status: DC | PRN
  Administered 2020-05-30: 10 mg via ORAL
  Filled 2020-05-30 (×2): qty 2

## 2020-05-30 MED ORDER — POLYVINYL ALCOHOL 1.4 % OP SOLN: 2.0000 [drp] | Freq: Four times a day (QID) | OPHTHALMIC | Status: DC | PRN

## 2020-05-30 MED ORDER — LIDOCAINE 2% (20 MG/ML) 5 ML SYRINGE
INTRAMUSCULAR | Status: DC | PRN
  Administered 2020-05-30: 60 mg via INTRAVENOUS

## 2020-05-30 MED ORDER — DEXAMETHASONE SODIUM PHOSPHATE 10 MG/ML IJ SOLN
INTRAMUSCULAR | Status: DC | PRN
  Administered 2020-05-30: 8 mg via INTRAVENOUS

## 2020-05-30 MED ORDER — ALBUMIN HUMAN 5 % IV SOLN: INTRAVENOUS | Status: DC | PRN

## 2020-05-30 MED ORDER — LACTATED RINGERS IV SOLN: INTRAVENOUS | Status: DC

## 2020-05-30 MED ORDER — AMISULPRIDE (ANTIEMETIC) 5 MG/2ML IV SOLN: 10.0000 mg | Freq: Once | INTRAVENOUS | Status: DC | PRN

## 2020-05-30 MED ORDER — OXYCODONE HCL 5 MG/5ML PO SOLN
5.0000 mg | Freq: Once | ORAL | Status: DC | PRN
Start: 2020-05-30 — End: 2020-05-30

## 2020-05-30 MED ORDER — METHOCARBAMOL 1000 MG/10ML IJ SOLN
500.0000 mg | Freq: Four times a day (QID) | INTRAVENOUS | Status: DC | PRN
  Filled 2020-05-30: qty 5

## 2020-05-30 MED ORDER — METHOCARBAMOL 500 MG PO TABS
500.0000 mg | ORAL_TABLET | Freq: Four times a day (QID) | ORAL | Status: DC | PRN
  Administered 2020-05-30 – 2020-05-31 (×3): 500 mg via ORAL
  Filled 2020-05-30 (×3): qty 1

## 2020-05-30 MED ORDER — FENOFIBRATE 160 MG PO TABS
160.0000 mg | ORAL_TABLET | Freq: Every day | ORAL | Status: DC
  Administered 2020-05-31 – 2020-06-06 (×7): 160 mg via ORAL
  Filled 2020-05-30 (×7): qty 1

## 2020-05-30 MED ORDER — HYDRALAZINE HCL 10 MG PO TABS: 10.0000 mg | ORAL_TABLET | Freq: Four times a day (QID) | ORAL | Status: DC | PRN

## 2020-05-30 MED ORDER — MIDAZOLAM HCL 2 MG/2ML IJ SOLN
INTRAMUSCULAR | Status: DC | PRN
  Administered 2020-05-30: 2 mg via INTRAVENOUS

## 2020-05-30 MED ORDER — MIDAZOLAM HCL 2 MG/2ML IJ SOLN
INTRAMUSCULAR | Status: AC
  Filled 2020-05-30: qty 2

## 2020-05-30 MED ORDER — BUPIVACAINE HCL (PF) 0.5 % IJ SOLN
INTRAMUSCULAR | Status: AC
  Filled 2020-05-30: qty 30

## 2020-05-30 MED ORDER — ATENOLOL 50 MG PO TABS
50.0000 mg | ORAL_TABLET | Freq: Every day | ORAL | Status: DC
  Administered 2020-05-31 – 2020-06-05 (×6): 50 mg via ORAL
  Filled 2020-05-30 (×7): qty 1

## 2020-05-30 MED ORDER — POTASSIUM CHLORIDE IN NACL 20-0.9 MEQ/L-% IV SOLN
INTRAVENOUS | Status: DC
  Filled 2020-05-30 (×11): qty 1000

## 2020-05-30 MED ORDER — EPHEDRINE SULFATE-NACL 50-0.9 MG/10ML-% IV SOSY
PREFILLED_SYRINGE | INTRAVENOUS | Status: DC | PRN
  Administered 2020-05-30 (×2): 10 mg via INTRAVENOUS

## 2020-05-30 MED ORDER — CEFAZOLIN SODIUM-DEXTROSE 2-4 GM/100ML-% IV SOLN
2.0000 g | INTRAVENOUS | Status: AC
  Administered 2020-05-30: 2 g via INTRAVENOUS
  Filled 2020-05-30: qty 100

## 2020-05-30 MED ORDER — ONDANSETRON HCL 4 MG PO TABS: 4.0000 mg | ORAL_TABLET | Freq: Four times a day (QID) | ORAL | Status: DC | PRN

## 2020-05-30 MED ORDER — PANTOPRAZOLE SODIUM 40 MG PO TBEC
40.0000 mg | DELAYED_RELEASE_TABLET | Freq: Every day | ORAL | Status: DC
  Administered 2020-05-31 – 2020-06-06 (×7): 40 mg via ORAL
  Filled 2020-05-30 (×7): qty 1

## 2020-05-30 MED ORDER — LIDOCAINE 2% (20 MG/ML) 5 ML SYRINGE
INTRAMUSCULAR | Status: AC
  Filled 2020-05-30: qty 5

## 2020-05-30 MED ORDER — ACETAMINOPHEN 500 MG PO TABS
1000.0000 mg | ORAL_TABLET | Freq: Once | ORAL | Status: AC
  Administered 2020-05-30: 1000 mg via ORAL
  Filled 2020-05-30: qty 2

## 2020-05-30 MED ORDER — ORAL CARE MOUTH RINSE: 15.0000 mL | Freq: Once | OROMUCOSAL | Status: AC

## 2020-05-30 MED ORDER — LACTATED RINGERS IV SOLN: INTRAVENOUS | Status: DC | PRN

## 2020-05-30 MED ORDER — POLYETHYLENE GLYCOL 3350 17 G PO PACK
17.0000 g | PACK | Freq: Every day | ORAL | Status: DC | PRN
  Administered 2020-05-31 – 2020-06-03 (×2): 17 g via ORAL
  Filled 2020-05-30 (×2): qty 1

## 2020-05-30 MED ORDER — PROPOFOL 10 MG/ML IV BOLUS
INTRAVENOUS | Status: AC
  Filled 2020-05-30: qty 20

## 2020-05-30 SURGICAL SUPPLY — 60 items
ADH SKN CLS APL DERMABOND .7 (GAUZE/BANDAGES/DRESSINGS) ×1
APL PRP STRL LF DISP 70% ISPRP (MISCELLANEOUS) ×1
BIT DRILL 7/64X5 DISP (BIT) ×2 IMPLANT
BLADE SAW SAG 73X25 THK (BLADE) ×1
BLADE SAW SGTL 73X25 THK (BLADE) ×1 IMPLANT
BNDG COHESIVE 6X5 TAN STRL LF (GAUZE/BANDAGES/DRESSINGS) ×2 IMPLANT
BRUSH FEMORAL CANAL (MISCELLANEOUS) ×2 IMPLANT
BRUSH SCRUB EZ PLAIN DRY (MISCELLANEOUS) ×4 IMPLANT
CEMENT RESTRICTOR BONE PREP ST (KITS) ×2 IMPLANT
CHLORAPREP W/TINT 26 (MISCELLANEOUS) ×2 IMPLANT
COVER SURGICAL LIGHT HANDLE (MISCELLANEOUS) ×2 IMPLANT
COVER WAND RF STERILE (DRAPES) ×2 IMPLANT
DERMABOND ADVANCED (GAUZE/BANDAGES/DRESSINGS) ×1
DERMABOND ADVANCED .7 DNX12 (GAUZE/BANDAGES/DRESSINGS) IMPLANT
DRAPE HIP W/POCKET STRL (MISCELLANEOUS) ×2 IMPLANT
DRAPE INCISE IOBAN 85X60 (DRAPES) ×4 IMPLANT
DRAPE ORTHO SPLIT 77X108 STRL (DRAPES)
DRAPE SURG 17X23 STRL (DRAPES) ×2 IMPLANT
DRAPE SURG ORHT 6 SPLT 77X108 (DRAPES) IMPLANT
DRAPE U-SHAPE 47X51 STRL (DRAPES) ×2 IMPLANT
DRSG MEPILEX BORDER 4X12 (GAUZE/BANDAGES/DRESSINGS) ×1 IMPLANT
DRSG MEPILEX BORDER 4X8 (GAUZE/BANDAGES/DRESSINGS) ×2 IMPLANT
DRSG OPSITE POSTOP 4X6 (GAUZE/BANDAGES/DRESSINGS) ×1 IMPLANT
ELECT BLADE 6.5 EXT (BLADE) IMPLANT
ELECT CAUTERY BLADE 6.4 (BLADE) IMPLANT
ELECT REM PT RETURN 9FT ADLT (ELECTROSURGICAL) ×2
ELECTRODE REM PT RTRN 9FT ADLT (ELECTROSURGICAL) ×1 IMPLANT
GLOVE BIO SURGEON STRL SZ 6.5 (GLOVE) ×6 IMPLANT
GLOVE BIO SURGEON STRL SZ7.5 (GLOVE) ×6 IMPLANT
GLOVE BIOGEL PI IND STRL 7.5 (GLOVE) ×1 IMPLANT
GLOVE BIOGEL PI INDICATOR 7.5 (GLOVE) ×1
GLOVE SURG UNDER POLY LF SZ6.5 (GLOVE) ×2 IMPLANT
GOWN STRL REUS W/ TWL LRG LVL3 (GOWN DISPOSABLE) ×2 IMPLANT
GOWN STRL REUS W/TWL LRG LVL3 (GOWN DISPOSABLE) ×4
HANDPIECE INTERPULSE COAX TIP (DISPOSABLE) ×2
KIT BASIN OR (CUSTOM PROCEDURE TRAY) ×2 IMPLANT
KIT TURNOVER KIT B (KITS) ×2 IMPLANT
MANIFOLD NEPTUNE II (INSTRUMENTS) ×2 IMPLANT
NDL 1/2 CIR MAYO (NEEDLE) ×1 IMPLANT
NEEDLE 1/2 CIR MAYO (NEEDLE) ×2 IMPLANT
NS IRRIG 1000ML POUR BTL (IV SOLUTION) ×2 IMPLANT
PACK TOTAL JOINT (CUSTOM PROCEDURE TRAY) ×2 IMPLANT
PAD ARMBOARD 7.5X6 YLW CONV (MISCELLANEOUS) ×4 IMPLANT
PRESSURIZER FEMORAL UNIV (MISCELLANEOUS) IMPLANT
RETRIEVER SUT HEWSON (MISCELLANEOUS) ×2 IMPLANT
SAW OSC TIP CART 19.5X105X1.3 (SAW) ×1 IMPLANT
SET HNDPC FAN SPRY TIP SCT (DISPOSABLE) ×1 IMPLANT
STAPLER VISISTAT 35W (STAPLE) ×2 IMPLANT
STOCKINETTE IMPERVIOUS LG (DRAPES) ×2 IMPLANT
SUT ETHILON 3 0 PS 1 (SUTURE) ×4 IMPLANT
SUT MNCRL AB 3-0 PS2 18 (SUTURE) IMPLANT
SUT VIC AB 0 CT1 27 (SUTURE) ×4
SUT VIC AB 0 CT1 27XBRD ANBCTR (SUTURE) ×2 IMPLANT
SUT VIC AB 1 CTX 36 (SUTURE)
SUT VIC AB 1 CTX36XBRD ANBCTR (SUTURE) ×2 IMPLANT
SUT VIC AB 2-0 CT1 27 (SUTURE) ×4
SUT VIC AB 2-0 CT1 TAPERPNT 27 (SUTURE) ×2 IMPLANT
TOWEL GREEN STERILE (TOWEL DISPOSABLE) ×2 IMPLANT
TOWER CARTRIDGE SMART MIX (DISPOSABLE) ×2 IMPLANT
WATER STERILE IRR 1000ML POUR (IV SOLUTION) ×2 IMPLANT

## 2020-05-30 NOTE — Anesthesia Procedure Notes (Signed)
Procedure Name: Intubation Date/Time: 05/30/2020 8:33 AM Performed by: Kathryne Hitch, CRNA Pre-anesthesia Checklist: Patient identified, Emergency Drugs available, Suction available and Patient being monitored Patient Re-evaluated:Patient Re-evaluated prior to induction Oxygen Delivery Method: Circle system utilized Preoxygenation: Pre-oxygenation with 100% oxygen Induction Type: IV induction Ventilation: Mask ventilation without difficulty Laryngoscope Size: Mac and 4 Grade View: Grade I Tube type: Oral Tube size: 7.5 mm Number of attempts: 1 Airway Equipment and Method: Stylet and Oral airway Placement Confirmation: ETT inserted through vocal cords under direct vision,  positive ETCO2 and breath sounds checked- equal and bilateral Secured at: 22 cm Tube secured with: Tape Dental Injury: Teeth and Oropharynx as per pre-operative assessment  Comments: Cathlean Cower, paramedic student intubated patient uneventfully.

## 2020-05-30 NOTE — Transfer of Care (Signed)
Immediate Anesthesia Transfer of Care Note  Patient: Herbert Williams  Procedure(s) Performed: Angelena Form ARTHROPLASTY LEFT HIP (Left Hip)  Patient Location: PACU  Anesthesia Type:General  Level of Consciousness: drowsy and patient cooperative  Airway & Oxygen Therapy: Patient Spontanous Breathing  Post-op Assessment: Report given to RN and Post -op Vital signs reviewed and stable  Post vital signs: Reviewed and stable  Last Vitals:  Vitals Value Taken Time  BP 142/84 05/30/20 1038  Temp    Pulse 78 05/30/20 1042  Resp 22 05/30/20 1042  SpO2 96 % 05/30/20 1042  Vitals shown include unvalidated device data.  Last Pain:  Vitals:   05/30/20 0614  TempSrc: Oral      Patients Stated Pain Goal: 4 (08/67/61 9509)  Complications: No complications documented.

## 2020-05-30 NOTE — Op Note (Signed)
Orthopaedic Surgery Operative Note (CSN: [id number] ) Date of Surgery: 05/30/2020  Admit Date: 05/30/2020   Diagnoses: Pre-Op Diagnoses: Left hip subluxation/dislocation   Post-Op Diagnosis: Same  Procedures: 1. CPT 27122-Left hip resection arthroplasty 2. 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  He sustained a fall and a small posterior wall fracture that caused his hip to subluxate.  He has had severe pain and limited movement of that hip since that time.  Is progressively gotten worse.  I discussed with him home resection arthroplasty as I did not feel that he was a candidate for total hip arthroplasty due to his spasticity.  Discussed risks and benefits with the patient.  Risks include but not limited to bleeding, continued pain, instability, leg length discrepancy, nerve or blood vessel injury, DVT and even the possibility anesthetic complications.  The patient agreed to proceed with surgery and consent was obtained.  Operative Findings: 1.  Resection arthroplasty of the left hip with removal of previous stem using a femoral osteotomy  Procedure: The patient was identified in the preoperative holding area. Consent was confirmed with the patient and their family and all questions were answered. The operative extremity was marked after confirmation with the patient. he was then brought back to the operating room by our anesthesia colleagues.  He was placed under general anesthetic and carefully transferred over to a  radiolucent flat top table.  He was placed on the lateral decubitus position with the left side up.  A axillary roll was placed to keep pressure off his neurovascular structures.  The left lower extremity was then prepped and draped in usual sterile fashion.  A timeout was performed to verify the patient, the procedure, and the extremity.  Preoperative antibiotics were dosed.  Made incision through his previous surgical site.  Carried it down through skin and subcutaneous tissue.  I split the gluteal fascia in line with my incision.  Here I exposed the capsule and the femoral head prosthesis.  I extended this distally and reflected up the vastus lateralis and the anterior portion of the abductors to visualize the insertion of the stem.  I attempted to remove the femoral head from the stem but it seemed to be fit in place without any movement.  As result I felt that I would have to remove the stem to remove the whole prosthesis.  I used an osteotome to developed the interval between the stem and the cortical bone.  I was able to develop an space between the entirety of the proximal femur stem.  Unfortunately I was not able to remove the stem as there was some distal growth.  From here I used fluoroscopy as a guide and I used a osteotome to create an osteotomy that extended down into the femoral shaft.  He was able to use the smaller osteotomes to break up the femoral ingrowth eventually I was able to remove the prosthesis.  The incision was then copiously irrigated.  A gram of vancomycin powder was placed into the incision.  The IT  band and vastus lateralis was closed with 0 Vicryl suture.  The vastus was attached to the osteotomy that I performed so closing the vastus allowed for the osteotomy to be repaired.  The IT band and was closed with 0 Vicryl suture.  The skin was closed with 2-0 Vicryl and 3-0 nylon.  A sterile dressing was placed.  The patient was awoken from anesthesia and taken to the PACU in stable  condition.   Post Op Plan/Instructions: Patient may try to place weight on his leg however he may not be able to initially.  He will receive postoperative Ancef.  He will receive Lovenox for DVT prophylaxis.  He will be mobilized with physical and Occupational Therapy.  He will likely need a skilled nursing facility postoperatively.  I was present and performed the entire surgery.  Patrecia Pace, PA-C did assist me throughout the case. An assistant was necessary given the difficulty in approach, maintenance of reduction and ability to instrument the fracture.   Katha Hamming, MD [facility]

## 2020-05-30 NOTE — H&P (Signed)
Orthopaedic Trauma Service (OTS) Consult   Patient ID: Herbert Williams MRN: [id number] DOB/AGE: 1965/03/08 55 y.o.  Reason for Surgery: Left hip pain  HPI: Herbert Williams is an 55 y.o. male resents for left hip resection arthroplasty for chronic left hip pain.  Patient has a history of brain tumor for which she has some spasticity in his lower extremities.  He had a femoral neck fracture that was treated with a hemiarthroplasty multiple years ago.  He recently had a fall in January of this year where he injured his hip.  Since that time he has been having progressive worsening hip pain and inability to move it.  CT scans have been obtained which shows a small posterior wall fracture.  Over the last few months this has caused increased subluxation of the femoral head out of the acetabulum and is causing severe discomfort.  I discussed with the patient proceeding with resection arthroplasty as I feel that with his spasticity and his positioning he would not be a candidate for total hip arthroplasty because of the increased risk of dislocation.  I do not feel that fixation of the acetabulum would be feasible.  He presents for surgery today.  Past Medical History:  Diagnosis Date  . Anxiety    claustrophobic  . Brain tumor ([place]) 1979   no current problems, no deficits  . 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 GERD (gastroesophageal reflux disease) 09/10/2013  . HTN (hypertension) 09/10/2013  . Hyperlipemia 09/10/2013    Past Surgical History:  Procedure Laterality Date  . BRAIN SURGERY    . EYE SURGERY Left    cataracts removed  . KNEE ARTHROSCOPY Left 09/11/2013   Procedure: LEFT ARTHROSCOPY WITH FIXATION OF FEMUR FX.;  Surgeon: Renette Butters, MD;  Location: [facility];  Service: Orthopedics;  Laterality: Left;  . PERCUTANEOUS PINNING Left 09/11/2013   Procedure: PERCUTANEOUS PINNING EXTREMITY;  Surgeon: Renette Butters, MD;  Location: [facility];  Service: Orthopedics;  Laterality: Left;  . TOTAL HIP ARTHROPLASTY Left     History reviewed. No pertinent family history.  Social History:  reports that he quit smoking about 3 months ago. His smoking use included cigarettes. He has a 19.00 pack-year smoking history. He has never used smokeless tobacco. He reports previous alcohol use. He reports that he does not use drugs.  Allergies:  Allergies  Allergen Reactions  . Pedi-Pre Tape Spray [Wound Dressing Adhesive] Rash  . Penicillins Rash    Medications:  No current facility-administered medications on file prior to encounter.   Current Outpatient Medications on File Prior to Encounter  Medication Sig Dispense Refill  . acetaminophen (TYLENOL) 650 MG CR tablet Take 1,300 mg by mouth every 8 (eight) hours as needed for pain.    Marland Kitchen atenolol (TENORMIN) 50 MG tablet Take 50 mg by mouth daily.    . calcium carbonate (TUMS - DOSED IN MG ELEMENTAL CALCIUM) 500 MG chewable tablet Chew 500 mg by mouth 3 (three) times daily as needed for indigestion or heartburn.    . fenofibrate 160 MG tablet Take 160 mg by mouth daily.    . fluticasone (FLONASE) 50 MCG/ACT nasal spray Place 1-2 sprays into both nostrils daily as needed for allergies or rhinitis.    . methocarbamol (ROBAXIN) 500 MG tablet Take 500 mg by mouth 3 (three) times daily as needed for muscle spasms.    . Omega-3  Fatty Acids (FISH OIL) 1000 MG CAPS Take 1,000 mg by mouth daily.    Marland Kitchen omeprazole (PRILOSEC) 20 MG capsule Take 20 mg by mouth daily.    . traMADol (ULTRAM) 50 MG tablet Take 50 mg by mouth every 6 (six) hours as needed for pain.    . vitamin B-12 (CYANOCOBALAMIN) 1000 MCG tablet Take 1,000 mcg by mouth daily.    . Vitamin D, Ergocalciferol, (DRISDOL) 1.25 MG (50000 UNIT) CAPS capsule Take 50,000 Units by mouth once a week.    Marland Kitchen aspirin 81 MG tablet Take 1 tablet (81 mg total) by mouth daily. 30 tablet 0  . carboxymethylcellulose (REFRESH PLUS)  0.5 % SOLN 2 drops 4 (four) times daily as needed (dry eyes.).    Marland Kitchen enoxaparin (LOVENOX) 40 MG/0.4ML injection Inject 0.4 mLs (40 mg total) into the skin daily. 21 Syringe 0  . HYDROcodone-acetaminophen (NORCO/VICODIN) 5-325 MG tablet Take 1 tablet by mouth every 6 (six) hours as needed. (Patient not taking: Reported on 05/20/2020) 10 tablet 0  . ibuprofen (ADVIL) 200 MG tablet Take 800 mg by mouth every 8 (eight) hours as needed for moderate pain.    Marland Kitchen thiamine 100 MG tablet Take 1 tablet (100 mg total) by mouth daily. (Patient not taking: Reported on 05/20/2020) 60 tablet 5    ROS: Constitutional: No fever or chills Vision: No changes in vision ENT: No difficulty swallowing CV: No chest pain Pulm: No SOB or wheezing GI: No nausea or vomiting GU: No urgency or inability to hold urine Skin: No poor wound healing Neurologic: No numbness or tingling Psychiatric: No depression or anxiety Heme: No bruising Allergic: No reaction to medications or food   Exam: Blood pressure 126/71, pulse 61, temperature 97.7 F (36.5 C), temperature source Oral, resp. rate 18, height 5\' 7"  (1.702 m), weight 81.6 kg. General: No acute distress Orientation: Awake alert and oriented x3 Mood and Affect: Cooperative and pleasant Gait: Unable to ambulate Coordination and balance: Within baseline limits  Left lower extremity: Healed incision.  Significant hip flexion and knee flexion with inability to straighten the leg.  He is neurovascularly at baseline.  He does have sensation to his lower extremity but does not have much motion to the leg.  Right lower extremity: Skin without lesions. No tenderness to palpation. Full painless ROM, full strength in each muscle groups without evidence of instability.   Medical Decision Making: Data: Imaging: CT scan of the left hip shows hemiarthroplasty with a subluxation femoral head and acetabulum with a small comminuted posterior wall acetabular fracture  Labs:   Results for orders placed or performed during the hospital encounter of 05/30/20 (from the past 24 hour(s))  ABO/Rh     Status: None (Preliminary result)   Collection Time: 05/30/20  6:58 AM  Result Value Ref Range   ABO/RH(D) PENDING     Imaging or Labs ordered: None  Medical history and chart was reviewed and case discussed with medical provider.  Assessment/Plan: 55 year old male with a history of brain tumor with spasticity with subluxation of previous hemiarthroplasty following a small posterior wall acetabular fracture  Due to his previous neurologic issues as well as his limited ambulatory capabilities I felt that a total hip arthroplasty was not indicated due to significant risk of dislocation.  I felt that the only reasonable option would proceed with a resection arthroplasty.  I discussed risks and benefits with the patient and his family.  Risks included but not limited to bleeding, infection, continued pain, instability,  leg length discrepancy, nerve or blood vessel injury, need for further surgery including potential left hip fusion, even the possibility anesthetic complications.  They agreed to proceed with surgery and consent was obtained.  Shona Needles, MD [facility] [phone] (office) [email or web address]

## 2020-05-30 NOTE — Anesthesia Postprocedure Evaluation (Signed)
Anesthesia Post Note  Patient: Herbert Williams  Procedure(s) Performed: GIRDLESTONE ARTHROPLASTY LEFT HIP (Left Hip)     Patient location during evaluation: PACU Anesthesia Type: General Level of consciousness: awake and alert Pain management: pain level controlled Vital Signs Assessment: post-procedure vital signs reviewed and stable Respiratory status: spontaneous breathing, nonlabored ventilation and respiratory function stable Cardiovascular status: blood pressure returned to baseline and stable Postop Assessment: no apparent nausea or vomiting Anesthetic complications: no   No complications documented.  Last Vitals:  Vitals:   05/30/20 1155 05/30/20 1215  BP: (!) 102/59 (!) 100/59  Pulse: 74 79  Resp: 14 16  Temp:  36.6 C  SpO2: 95% 99%    Last Pain:  Vitals:   05/30/20 1215  TempSrc: Oral  PainSc:                  Herbert Williams

## 2020-05-31 ENCOUNTER — Encounter (HOSPITAL_COMMUNITY): Payer: Self-pay | Admitting: Student

## 2020-05-31 LAB — BASIC METABOLIC PANEL
Anion gap: 8 (ref 5–15)
BUN: 10 mg/dL (ref 6–20)
CO2: 31 mmol/L (ref 22–32)
Calcium: 9.4 mg/dL (ref 8.9–10.3)
Chloride: 99 mmol/L (ref 98–111)
Creatinine, Ser: 0.77 mg/dL (ref 0.61–1.24)
GFR, Estimated: >60 mL/min (ref >60)
Glucose, Bld: 110 mg/dL — ABNORMAL HIGH (ref 70–99)
Potassium: 4.4 mmol/L (ref 3.5–5.1)
Sodium: 138 mmol/L (ref 135–145)

## 2020-05-31 LAB — CBC
HCT: 33.8 % — ABNORMAL LOW (ref 39.0–52.0)
Hemoglobin: 10.5 g/dL — ABNORMAL LOW (ref 13.0–17.0)
MCH: 29.6 pg (ref 26.0–34.0)
MCHC: 31.1 g/dL (ref 30.0–36.0)
MCV: 95.2 fL (ref 80.0–100.0)
Platelets: 264 10*3/uL (ref 150–400)
RBC: 3.55 MIL/uL — ABNORMAL LOW (ref 4.22–5.81)
RDW: 13.3 % (ref 11.5–15.5)
WBC: 11 10*3/uL — ABNORMAL HIGH (ref 4.0–10.5)
nRBC: 0 % (ref 0.0–0.2)

## 2020-05-31 LAB — VITAMIN D 25 HYDROXY (VIT D DEFICIENCY, FRACTURES): Vit D, 25-Hydroxy: 59.75 ng/mL (ref 30–100)

## 2020-05-31 MED ORDER — MORPHINE SULFATE (PF) 2 MG/ML IV SOLN
2.0000 mg | INTRAVENOUS | Status: DC | PRN
  Administered 2020-05-31 – 2020-06-04 (×6): 2 mg via INTRAVENOUS
  Filled 2020-05-31 (×6): qty 1

## 2020-05-31 MED ORDER — METHOCARBAMOL 1000 MG/10ML IJ SOLN
750.0000 mg | Freq: Four times a day (QID) | INTRAVENOUS | Status: DC
  Administered 2020-05-31 – 2020-06-02 (×7): 750 mg via INTRAVENOUS
  Filled 2020-05-31 (×10): qty 7.5

## 2020-05-31 MED ORDER — METHOCARBAMOL 500 MG PO TABS
750.0000 mg | ORAL_TABLET | Freq: Four times a day (QID) | ORAL | Status: DC | PRN
  Administered 2020-05-31: 750 mg via ORAL
  Filled 2020-05-31: qty 2

## 2020-05-31 MED ORDER — METHOCARBAMOL 1000 MG/10ML IJ SOLN
750.0000 mg | Freq: Four times a day (QID) | INTRAVENOUS | Status: DC | PRN
  Filled 2020-05-31: qty 7.5

## 2020-05-31 NOTE — Progress Notes (Signed)
    Subjective: Patient reports pain as moderate to severe. Having severe spasms currently. Worse than usual he says. Tolerating diet somewhat. Urinating. No CP, SOB. Has been bedridden since January and uses wheelchair to mobilize so hasn't been able to work with PT/OT on mobilizing OOB. This is a process that will likely take awhile to reach.   Objective:   VITALS:   Vitals:   05/30/20 1215 05/30/20 1432 05/30/20 1809 05/31/20 0846  BP: (!) 100/59 (!) 88/61 (!) 93/57 (!) 127/97  Pulse: 79 81 98 79  Resp: 16 17 17 17   Temp: 97.8 F (36.6 C) 98.5 F (36.9 C) 98.4 F (36.9 C) 98.5 F (36.9 C)  TempSrc: Oral Oral Oral Oral  SpO2: 99% 96% 94% 95%  Weight:      Height:       CBC Latest Ref Rng & Units 05/31/2020 05/30/2020 05/28/2020  WBC 4.0 - 10.5 K/uL 11.0(H) 16.7(H) 8.9  Hemoglobin 13.0 - 17.0 g/dL 10.5(L) 11.2(L) 14.4  Hematocrit 39.0 - 52.0 % 33.8(L) 35.8(L) 44.9  Platelets 150 - 400 K/uL 264 256 294   BMP Latest Ref Rng & Units 05/31/2020 05/30/2020 05/28/2020  Glucose 70 - 99 mg/dL 110(H) - 107(H)  BUN 6 - 20 mg/dL 10 - 20  Creatinine 0.61 - 1.24 mg/dL 0.77 0.71 0.76  Sodium 135 - 145 mmol/L 138 - 140  Potassium 3.5 - 5.1 mmol/L 4.4 - 4.2  Chloride 98 - 111 mmol/L 99 - 101  CO2 22 - 32 mmol/L 31 - 29  Calcium 8.9 - 10.3 mg/dL 9.4 - 9.9   Intake/Output      04/22 0701 04/23 0700 04/23 0701 04/24 0700   I.V. (mL/kg) 1000 (12.3)    IV Piggyback 350    Total Intake(mL/kg) 1350 (16.5)    Urine (mL/kg/hr) 700 (0.4) 350 (1.2)   Blood 400    Total Output 1100 350   Net +250 -350           Physical Exam: General: NAD. Laying in bed on right side with legs contracted. Can see patient having frequent spasms. Screaming out in pain when this occurs.  Resp: No increased wob Cardio: regular rate and rhythm ABD soft Neurologically intact MSK Neurovascularly intact Sensation intact distally Intact pulses distally Dorsiflexion/Plantar flexion intact Incision: dressing  C/D/I, some minor spots of blood   Assessment: 1 Day Post-Op  S/P Procedure(s) (LRB): GIRDLESTONE ARTHROPLASTY LEFT HIP (Left) by Dr. Doreatha Martin on 05/30/20  Principal Problem:   Subluxation of left hip ([place]) Active Problems:   Chronic left hip pain   Plan: Need to work on getting muscle spasms under better control. He normally takes Robaxin 500mg  tid and currently has that ordered q6h PRN but not helping. May need to increased dose or try different muscle relaxer.  Advance diet Up with therapy Incentive Spirometry Elevate and Apply ice  Weightbearing: WBAT LLE Insicional and dressing care: Reinforce dressings as needed Orthopedic device(s): None Showering: Keep dressing dry VTE prophylaxis: Lovenox 40mg  qd , SCDs, ambulation Pain control: Continue current regimen Follow - up plan: 2 weeks  In the office with Dr. Suella Grove information for today:  Edmonia Lynch MD, Aggie Moats PA-C  Dispo: Skilled Nursing Facility/Rehab or [place], PA-C Office [phone] 05/31/2020, 10:27 AM

## 2020-05-31 NOTE — Progress Notes (Signed)
Occupational Therapy Evaluation Patient Details Name: Herbert Williams MRN: [id number] DOB: 26-Sep-1965 Today's Date: 05/31/2020    History of Present Illness Pt is an 55 y.o. male presents for left hip resection arthroplasty for chronic left hip pain.  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  He had a femoral neck fracture that was treated with a hemiarthroplasty multiple years ago.  He recently had a fall in January of this year where he injured his hip.  Since that time he has been having progressive worsening hip pain and inability to move it. Found with L subluxation/ dislocation, now POD 1 Left hip resection arthroplasty and removal of L hip prosthesis.   Clinical Impression   Pt pleasant this date but significantly limited by pain tolerance and, per pt report, significantly increased spasticity to B LE's from baseline. Currently pt reporting with movement pain ranging from 7-10/10 pain, improved with rest. Currently pt presents with decreased activity tolerance, pain, strength, spasticity and coordination all leading to need for max +2 for all bed mobility, transfers to EOB and lateral scooting and max-dep +2 for ADL's. Pt endorses at baseline he lives with his mother who A with basic ADL's including bathing/dressing and toileting as needed, but he typically would complete pivot transfers and bed mobility at Sup-mod indep level. Pt is unsafe for d/c to return to home at this functional level and anticipate with improved spasticity management would be a great candidate for d/c to high intensity post acute. OT will continue to follow acutely.     Follow Up Recommendations  CIR;Supervision/Assistance - 24 hour    Equipment Recommendations  Other (comment) (TBD)    Recommendations for Other Services Rehab consult     Precautions / Restrictions Precautions Precautions: Other (comment) Precaution Comments: significant spasticity in all  extremities, LE > UE Restrictions LLE Weight Bearing: Weight bearing as tolerated      Mobility Bed Mobility Overal bed mobility: Needs Assistance Bed Mobility: Rolling;Sidelying to Sit;Sit to Sidelying Rolling: Max assist Sidelying to sit: Max assist;+2 for physical assistance     Sit to sidelying: Max assist;+2 for physical assistance General bed mobility comments: significant assistance requiring d/t severeity of spasticity this date.    Transfers Overall transfer level: Needs assistance   Transfers: Lateral/Scoot Transfers          Lateral/Scoot Transfers: Max assist;+2 physical assistance General transfer comment: lateral scooting transfer simulated with scooting EOB to L side, heavy A for sequencing and completion of all components of task.    Balance Overall balance assessment: Needs assistance Sitting-balance support: Bilateral upper extremity supported;Feet supported Sitting balance-Leahy Scale: Zero Sitting balance - Comments: patient with forward and right lean in sitting; patient with shaking/spasticity in sitting requiring max assistance for upright and safety.                                   ADL either performed or assessed with clinical judgement   ADL Overall ADL's : Needs assistance/impaired Eating/Feeding: Bed level;Set up;Supervision/ safety   Grooming: Moderate assistance;Bed level   Upper Body Bathing: Maximal assistance;Sitting   Lower Body Bathing: Total assistance;+2 for physical assistance;Bed level;Sitting/lateral leans   Upper Body Dressing : Maximal assistance;Sitting   Lower Body Dressing: Total assistance;Bed level                 General ADL Comments: pt  limited primarily by tone/spasticity this date leading to need for max-dep +2 for safety with rolling, scooting, sitting tasks and all ADL's of toileting bathing and dressing address d/t incotnience and being covered in urine d/t malfunction of condom cath.  despite effort ofp t limited by neurological impairments, pt requiring max-total assist. anticiapte some improved function with medication management of spasticity.     Vision Baseline Vision/History: No visual deficits Vision Assessment?: No apparent visual deficits     Perception Perception Perception Tested?: No   Praxis      Pertinent Vitals/Pain Pain Assessment: 0-10 Pain Score: 7  Pain Location: left hip Pain Descriptors / Indicators: Discomfort;Grimacing;Guarding;Contraction;Spasm Pain Intervention(s): Limited activity within patient's tolerance;Patient requesting pain meds-RN notified;Repositioned     Hand Dominance Right   Extremity/Trunk Assessment Upper Extremity Assessment Upper Extremity Assessment: Overall WFL for tasks assessed   Lower Extremity Assessment Lower Extremity Assessment: Defer to PT evaluation (limited ability to assess d/t increased spasticity this date to B LE's R>L) RLE Deficits / Details: significant spasticity present; unable to assess ROM but appears to have contractures due to spasticity LLE Deficits / Details: significant spasticity present; unable to assess ROM but appears to have contractures due to spasticity   Cervical / Trunk Assessment Cervical / Trunk Assessment: Kyphotic   Communication Communication Communication: Expressive difficulties   Cognition Arousal/Alertness: Awake/alert Behavior During Therapy: WFL for tasks assessed/performed Overall Cognitive Status: No family/caregiver present to determine baseline cognitive functioning                                 General Comments: no significnat cognitive deficits noted, slowed processing but [facility] for participation   General Comments  edema present to R LE distally, no pitting present    Exercises     Shoulder Instructions      Home Living Family/patient expects to be discharged to:: Private residence Living Arrangements: Parent Available Help at  Discharge: Family Type of Home: House Home Access: Ramped entrance     Home Layout: One level     Bathroom Shower/Tub: Tub/shower unit;Other (comment)   Bathroom Toilet: Handicapped height Bathroom Accessibility: Yes How Accessible: Accessible via wheelchair Home Equipment: Wheelchair - manual;Bedside commode;Hospital bed;Other (comment)   Additional Comments: pt endorses completing functional transfers from bed level to w/c and commode indep via pivot transfer mother present for supervision, but mother assists with mangement of urnial. mildly inconsistent in reports.      Prior Functioning/Environment Level of Independence: Needs assistance  Gait / Transfers Assistance Needed: patient reports he is able to transfer to w/c independently with mother holding w/c in place ADL's / Homemaking Assistance Needed: endorses being able to complete pivot transfers bed<>w/c and commode but mother present for safety. mother otherwise assists with all basic ADL's, including dressing/sponge bathing/medication and set up for eating.            OT Problem List: Decreased activity tolerance;Impaired balance (sitting and/or standing);Decreased coordination;Decreased safety awareness;Decreased knowledge of use of DME or AE      OT Treatment/Interventions: Self-care/ADL training;Therapeutic exercise;Neuromuscular education;DME and/or AE instruction;Therapeutic activities;Balance training;Patient/family education;Manual therapy    OT Goals(Current goals can be found in the care plan section) Acute Rehab OT Goals Patient Stated Goal: go back home OT Goal Formulation: With patient Time For Goal Achievement: 06/14/20 Potential to Achieve Goals: Fair ADL Goals Pt Will Perform Grooming: with supervision;sitting Pt Will Transfer to Toilet: with min assist;bedside commode Additional  ADL Goal #1: pt will tolerate 5 minutes of unsupported static sitting at EOB with Sup in prep for transfers and seated ADL  tasks.  OT Frequency: Min 2X/week   Barriers to D/C: Decreased caregiver support  pt endorses that mother is unable to provide physical assist required at this time for safe transition to home       Co-evaluation PT/OT/SLP Co-Evaluation/Treatment: Yes Reason for Co-Treatment: Complexity of the patient's impairments (multi-system involvement) PT goals addressed during session: Mobility/safety with mobility;Balance        AM-PAC OT "6 Clicks" Daily Activity     Outcome Measure Help from another person eating meals?: A Little Help from another person taking care of personal grooming?: A Lot Help from another person toileting, which includes using toliet, bedpan, or urinal?: Total Help from another person bathing (including washing, rinsing, drying)?: Total Help from another person to put on and taking off regular upper body clothing?: A Lot Help from another person to put on and taking off regular lower body clothing?: Total 6 Click Score: 10   End of Session Nurse Communication: Mobility status;Need for lift equipment;Precautions;Patient requests pain meds;Other (comment) (alerted nurse of spasticity level and current level of mobility and needs.)  Activity Tolerance: Patient limited by pain Patient left: in bed;with call bell/phone within reach;with nursing/sitter in room;with bed alarm set;Other (comment)  OT Visit Diagnosis: Unsteadiness on feet (R26.81);Pain;Other abnormalities of gait and mobility (R26.89) Pain - Right/Left: Left Pain - part of body: Hip                Time: 9798-9211 OT Time Calculation (min): 38 min Charges:  OT General Charges $OT Visit: 1 Visit OT Evaluation $OT Eval High Complexity: 1 High  Desean Heemstra OTR/L acute rehab services Office: [phone]  05/31/2020, 11:34 AM

## 2020-05-31 NOTE — Progress Notes (Signed)
Inpatient Rehab Admissions Coordinator Note:   Per PT/OT recommendations, pt was screened for CIR candidacy by Gayland Curry, MS, CCC-SLP.  At this time we are recommending an inpatient rehab consult.  Please place an IP Rehab MD consult order if pt would like to be considered.  Please contact me with questions.    Gayland Curry, MS, CCC-SLP Admissions Coordinator [phone] 05/31/20 1:14 PM

## 2020-05-31 NOTE — Evaluation (Signed)
Physical Therapy Evaluation Patient Details Name: Herbert Williams MRN: [id number] DOB: [birth year] Today's Date: 05/31/2020   History of Present Illness  Pt is an 55 y.o. male presents for left hip resection arthroplasty for chronic left hip pain.  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  He had a femoral neck fracture that was treated with a hemiarthroplasty multiple years ago.  He recently had a fall in January of this year where he injured his hip.  Since that time he has been having progressive worsening hip pain and inability to move it.  Clinical Impression  Patient presents with significant spasticity impacting his independence with transfers and mobility.  Patient reports this is more spasticity than he normally has.  Due to spasticity session today limited to sitting edge of bed - patient required max assist for safety and balance to sit EOB.  Feel patient will do better once spasticity more under control and can return to baseline.  Patient is motivated and has assistance at home (mother).  Recommend CIR to maximize potential and independence.      Follow Up Recommendations CIR    Equipment Recommendations  None recommended by PT    Recommendations for Other Services Rehab consult     Precautions / Restrictions Precautions Precautions: Other (comment) Precaution Comments: significant spasticity in all extremities, LE > UE Restrictions LLE Weight Bearing: Weight bearing as tolerated      Mobility  Bed Mobility Overal bed mobility: Needs Assistance Bed Mobility: Rolling;Sidelying to Sit;Sit to Sidelying Rolling: Max assist Sidelying to sit: Max assist;+2 for physical assistance     Sit to sidelying: Max assist;+2 for physical assistance General bed mobility comments: assistance due to significant spasticity    Transfers                    Ambulation/Gait                Stairs             Wheelchair Mobility    Modified Rankin (Stroke Patients Only)       Balance Overall balance assessment: Needs assistance Sitting-balance support: Bilateral upper extremity supported;Feet supported Sitting balance-Leahy Scale: Zero Sitting balance - Comments: patient with forward and right lean in sitting; patient with shaking/spasticity in sitting requiring max assistance for upright and safety.                                     Pertinent Vitals/Pain Pain Assessment: 0-10 Pain Score: 8  Pain Location: left hip Pain Descriptors / Indicators: Discomfort;Grimacing;Guarding;Contraction;Spasm Pain Intervention(s): Limited activity within patient's tolerance;Monitored during session;Premedicated before session    [place] expects to be discharged to:: Private residence Living Arrangements: Parent Available Help at Discharge: Family Type of Home: House Home Access: [place]: One level Bathroom Shower/Tub: Research scientist (physical sciences) (comment) Bathroom Toilet: Handicapped height Bathroom Accessibility: Yes Home Equipment: Wheelchair - manual,Bedside commode,Hospital bed,Other (comment) Additional Comments: pt endorses completing functional transfers from bed level to w/c and commode indep via pivot transfer mother present for supervision, but mother assists with mangement of urnial. mildly inconsistent in reports.                     Prior Function Level of Independence: Needs assistance   Gait /  Transfers Assistance Needed: patient reports he is able to transfer to w/c independently with mother holding w/c in place           Hand Dominance        Extremity/Trunk Assessment        Lower Extremity Assessment Lower Extremity Assessment: RLE deficits/detail;LLE deficits/detail RLE Deficits / Details: significant spasticity present; unable to assess ROM but appears to have contractures due to  spasticity LLE Deficits / Details: significant spasticity present; unable to assess ROM but appears to have contractures due to spasticity       Communication   Communication: Expressive difficulties (mild expressive difficulties, more like stuttering, easy to understand)  Cognition Arousal/Alertness: Awake/alert Behavior During Therapy: WFL for tasks assessed/performed Overall Cognitive Status: No family/caregiver present to determine baseline cognitive functioning                                 General Comments: no apparent cognitive deficits noted      General Comments      Exercises     Assessment/Plan    PT Assessment Patient needs continued PT services  PT Problem List Decreased strength;Decreased range of motion;Decreased activity tolerance;Decreased balance;Decreased mobility;Pain       PT Treatment Interventions DME instruction;Functional mobility training;Balance training;Therapeutic exercise;Therapeutic activities;Patient/family education    PT Goals (Current goals can be found in the Care Plan section)  Acute Rehab PT Goals Patient Stated Goal: go back home PT Goal Formulation: With patient Time For Goal Achievement: 06/14/20 Potential to Achieve Goals: Good    Frequency Min 3X/week   Barriers to discharge   lives with mother who is his primary caregiver    Co-evaluation PT/OT/SLP Co-Evaluation/Treatment: Yes Reason for Co-Treatment: Complexity of the patient's impairments (multi-system involvement);For patient/therapist safety PT goals addressed during session: Mobility/safety with mobility;Balance         AM-PAC PT "6 Clicks" Mobility  Outcome Measure Help needed turning from your back to your side while in a flat bed without using bedrails?: A Lot Help needed moving from lying on your back to sitting on the side of a flat bed without using bedrails?: A Lot Help needed moving to and from a bed to a chair (including a wheelchair)?:  Total Help needed standing up from a chair using your arms (e.g., wheelchair or bedside chair)?: Total Help needed to walk in hospital room?: Total Help needed climbing 3-5 steps with a railing? : Total 6 Click Score: 8    End of Session   Activity Tolerance: Patient tolerated treatment well Patient left: in bed;with call bell/phone within reach;with bed alarm set Nurse Communication: Mobility status;Patient requests pain meds PT Visit Diagnosis: Other abnormalities of gait and mobility (R26.89);History of falling (Z91.81)    Time: 0017-4944 PT Time Calculation (min) (ACUTE ONLY): 22 min   Charges:   PT Evaluation $PT Eval Moderate Complexity: 1 Mod          05/31/2020 Margie, PT Acute Rehabilitation Services Pager:  [phone] Office:  [phone]    Shanna Cisco 05/31/2020, 10:28 AM

## 2020-05-31 NOTE — Plan of Care (Signed)
  Problem: Education: Goal: Knowledge of General Education information will improve Description: Including pain rating scale, medication(s)/side effects and non-pharmacologic comfort measures Outcome: Progressing   Problem: Health Behavior/Discharge Planning: Goal: Ability to manage health-related needs will improve Outcome: Progressing   Problem: Clinical Measurements: Goal: Respiratory complications will improve Outcome: Progressing   Problem: Nutrition: Goal: Adequate nutrition will be maintained Outcome: Progressing   Problem: Elimination: Goal: Will not experience complications related to bowel motility Outcome: Progressing   Problem: Safety: Goal: Ability to remain free from injury will improve Outcome: Progressing   Problem: Skin Integrity: Goal: Risk for impaired skin integrity will decrease Outcome: Progressing

## 2020-06-01 LAB — CBC
HCT: 29.7 % — ABNORMAL LOW (ref 39.0–52.0)
Hemoglobin: 9.4 g/dL — ABNORMAL LOW (ref 13.0–17.0)
MCH: 30 pg (ref 26.0–34.0)
MCHC: 31.6 g/dL (ref 30.0–36.0)
MCV: 94.9 fL (ref 80.0–100.0)
Platelets: 234 10*3/uL (ref 150–400)
RBC: 3.13 MIL/uL — ABNORMAL LOW (ref 4.22–5.81)
RDW: 13.3 % (ref 11.5–15.5)
WBC: 9.2 10*3/uL (ref 4.0–10.5)
nRBC: 0 % (ref 0.0–0.2)

## 2020-06-01 LAB — BASIC METABOLIC PANEL
Anion gap: 8 (ref 5–15)
BUN: 10 mg/dL (ref 6–20)
CO2: 28 mmol/L (ref 22–32)
Calcium: 8.9 mg/dL (ref 8.9–10.3)
Chloride: 100 mmol/L (ref 98–111)
Creatinine, Ser: 0.63 mg/dL (ref 0.61–1.24)
GFR, Estimated: >60 mL/min (ref >60)
Glucose, Bld: 106 mg/dL — ABNORMAL HIGH (ref 70–99)
Potassium: 3.8 mmol/L (ref 3.5–5.1)
Sodium: 136 mmol/L (ref 135–145)

## 2020-06-01 NOTE — Progress Notes (Signed)
Subjective: 2 Days Post-Op Procedure(s) (LRB): GIRDLESTONE ARTHROPLASTY LEFT HIP (Left) Patient reports pain as moderate.    Objective: Vital signs in last 24 hours: Temp:  [98.1 F (36.7 C)-98.3 F (36.8 C)] 98.1 F (36.7 C) (04/24 0819) Pulse Rate:  [70-89] 89 (04/24 0819) Resp:  [14-17] 14 (04/24 0819) BP: (95-128)/(53-59) 128/59 (04/24 0819) SpO2:  [92 %-99 %] 99 % (04/23 2029)  Intake/Output from previous day: 04/23 0701 - 04/24 0700 In: 1945 [I.V.:1945] Out: 1250 [Urine:1250] Intake/Output this shift: No intake/output data recorded.  Recent Labs    05/30/20 1309 05/31/20 0225 06/01/20 0253  HGB 11.2* 10.5* 9.4*   Recent Labs    05/31/20 0225 06/01/20 0253  WBC 11.0* 9.2  RBC 3.55* 3.13*  HCT 33.8* 29.7*  PLT 264 234   Recent Labs    05/31/20 0225 06/01/20 0253  NA 138 136  K 4.4 3.8  CL 99 100  CO2 31 28  BUN 10 10  CREATININE 0.77 0.63  GLUCOSE 110* 106*  CALCIUM 9.4 8.9   No results for input(s): LABPT, INR in the last 72 hours.  LLE in flexed position under R leg in bed.  Unable to fully extend knee without significant spasticity, dsg in place L hip C/D/I.  Distal pulses intact    Assessment/Plan: 2 Days Post-Op Procedure(s) (LRB): GIRDLESTONE ARTHROPLASTY LEFT HIP (Left)  WBAT LLE although pt reports he has been bedridden for over a years time.  Lives with his mother who is at bedside and very hard of hearing.  He reports mobility at home is we would stand/pivot to wheelchair on his R leg, but upon further discussion has been nonambulatory for quite some time.  Goal would be to get him at least back to baseline with stand/pivot to wheelchair. Consult to CIR per PT/OT recs dsg change prn lovenox dvt proph Pain control as ordered      Chriss Czar 06/01/2020, 11:28 AM

## 2020-06-01 NOTE — PMR Pre-admission (Shared)
PMR Admission Coordinator Pre-Admission Assessment  Patient: Herbert Williams is an 55 y.o., male MRN: [id number] DOB: 03/06/1965 Height: 5' 7"  (170.2 cm) Weight: 81.6 kg  Insurance Information HMO: yes    PPO:      PCP:      IPA:      80/20:      OTHER:  PRIMARY: [employer]      Policy#: [id number]      Subscriber: patient CM Name: ***      Phone#: ***     Fax#: [phone] Pre-Cert#: [id number]      Employer:  Benefits:  Phone #: n/a-[email or web address]     Name:  Eff. Date: 02/09/19-still active     Deduct: does not have   Out of Pocket Max: $3,900 ($569.77 met)      Life Max: NA CIR: $295/day co-pay with max co-pay of $1,770/admission      SNF: 100% Outpatient: $10-20/visit co-pay     Co-Pay:  Home Health: 100%      Co-Pay:  DME: 80%     Co-Pay: 20% co-insurance Providers: in-network SECONDARY:       Policy#:      Phone#:   Development worker, community:       Phone#:   The Engineer, petroleum" for patients in Inpatient Rehabilitation Facilities with attached "Privacy Act [place] Records" was provided and verbally reviewed with: {CHL IP Patient Family QB:[id number]}  Emergency Contact Information Contact Information    Name Relation Home Work Mobile   Cuauhtemoc, Huegel Mother [phone]  [phone]   luks,jennifer Sister   [phone]      Current Medical History  Patient Admitting Diagnosis: subluxation of left hip, s/p left hip resection arthroplasty History of Present Illness: Pt is a 55 year old male with medical hx significant for: HTN, anxiety, brain tumor (has some spasticity in lower extremities), GERD, hyperlipemia, femoral neck fx s/p hemiarthroplasty(years ago).  Pt presented to hospital on 05/30/20 for left hip resection arthroplasty for chronic left hip pain. Pt had fall in January 2022 and injured hip.  Since then, he has been having progressive worsening hip pain and inability to move it. CT scans show a small posterior wall fx. Over last  few months, this has caused increased subluxation of the femoral head out of the acetabulum and severe discomfort. Therapy evaluations completed and CIR rx d/t pt deficits in functional mobility and ability to complete ADLs.    Patient's medical record from [facility] has been reviewed by the rehabilitation admission coordinator and physician.  Past Medical History  Past Medical History:  Diagnosis Date  . Anxiety    claustrophobic  . Brain tumor ([place]) 1979   no current problems, no deficits  . 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 GERD (gastroesophageal reflux disease) 09/10/2013  . HTN (hypertension) 09/10/2013  . Hyperlipemia 09/10/2013    Family History   family history is not on file.  Prior Rehab/Hospitalizations Has the patient had prior rehab or hospitalizations prior to admission? No  Has the patient had major surgery during 100 days prior to admission? Yes   Current Medications  Current Facility-Administered Medications:  .  0.9 % NaCl with KCl 20 mEq/ L  infusion, , Intravenous, Continuous, Vivien Rota, Last Rate: 75 mL/hr at 06/01/20 2125, New Bag at 06/01/20 2125 .  acetaminophen (TYLENOL) tablet 1,000 mg, 1,000 mg, Oral, Q8H,  Delray Alt, PA-C, 1,000 mg at 06/02/20 9323 .  aspirin EC tablet 81 mg, 81 mg, Oral, Daily, Delray Alt, PA-C, 81 mg at 06/02/20 5573 .  atenolol (TENORMIN) tablet 50 mg, 50 mg, Oral, Daily, Delray Alt, PA-C, 50 mg at 06/02/20 2202 .  calcium carbonate (TUMS - dosed in mg elemental calcium) chewable tablet 500 mg, 500 mg, Oral, TID PRN, Patrecia Pace A, PA-C .  docusate sodium (COLACE) capsule 100 mg, 100 mg, Oral, BID, Delray Alt, PA-C, 100 mg at 06/02/20 0914 .  enoxaparin (LOVENOX) injection 40 mg, 40 mg, Subcutaneous, Q24H, Delray Alt, PA-C, 40 mg at 06/02/20 5427 .  fenofibrate tablet 160 mg, 160 mg, Oral, Daily,  Delray Alt, PA-C, 160 mg at 06/02/20 0623 .  fluticasone (FLONASE) 50 MCG/ACT nasal spray 1-2 spray, 1-2 spray, Each Nare, Daily PRN, Patrecia Pace A, PA-C .  hydrALAZINE (APRESOLINE) tablet 10 mg, 10 mg, Oral, Q6H PRN, Delray Alt, PA-C .  HYDROmorphone (DILAUDID) injection 0.5-1 mg, 0.5-1 mg, Intravenous, Q4H PRN, Delray Alt, PA-C, 1 mg at 05/31/20 1954 .  ketorolac (TORADOL) 15 MG/ML injection 15 mg, 15 mg, Intravenous, Q6H, Yacobi, Sarah A, PA-C .  methocarbamol (ROBAXIN) 1,000 mg in dextrose 5 % 100 mL IVPB, 1,000 mg, Intravenous, Q6H **OR** methocarbamol (ROBAXIN) tablet 1,000 mg, 1,000 mg, Oral, Q6H, Yacobi, Sarah A, PA-C .  metoCLOPramide (REGLAN) tablet 5-10 mg, 5-10 mg, Oral, Q8H PRN **OR** metoCLOPramide (REGLAN) injection 5-10 mg, 5-10 mg, Intravenous, Q8H PRN, Ricci Barker, Sarah A, PA-C .  morphine 2 MG/ML injection 2 mg, 2 mg, Intravenous, Q2H PRN, Gawne, Meghan M, PA-C, 2 mg at 06/01/20 7628 .  ondansetron (ZOFRAN) tablet 4 mg, 4 mg, Oral, Q6H PRN **OR** ondansetron (ZOFRAN) injection 4 mg, 4 mg, Intravenous, Q6H PRN, Ricci Barker, Sarah A, PA-C .  oxyCODONE (Oxy IR/ROXICODONE) immediate release tablet 10-15 mg, 10-15 mg, Oral, Q4H PRN, Patrecia Pace A, PA-C, 15 mg at 06/02/20 0913 .  oxyCODONE (Oxy IR/ROXICODONE) immediate release tablet 5-10 mg, 5-10 mg, Oral, Q4H PRN, Patrecia Pace A, PA-C, 10 mg at 05/30/20 1715 .  pantoprazole (PROTONIX) EC tablet 40 mg, 40 mg, Oral, Daily, Delray Alt, PA-C, 40 mg at 06/02/20 3151 .  polyethylene glycol (MIRALAX / GLYCOLAX) packet 17 g, 17 g, Oral, Daily PRN, Delray Alt, PA-C, 17 g at 05/31/20 0804 .  polyvinyl alcohol (LIQUIFILM TEARS) 1.4 % ophthalmic solution 2 drop, 2 drop, Both Eyes, QID PRN, Haddix, Thomasene Lot, MD .  vitamin B-12 (CYANOCOBALAMIN) tablet 1,000 mcg, 1,000 mcg, Oral, Daily, Delray Alt, PA-C, 1,000 mcg at 06/02/20 0915 .  [START ON 06/05/2020] Vitamin D (Ergocalciferol) (DRISDOL) capsule 50,000 Units, 50,000 Units,  Oral, Weekly, Patrecia Pace A, PA-C  Patients Current Diet:  Diet Order            Diet Heart Room service appropriate? Yes; Fluid consistency: Thin  Diet effective now                 Precautions / Restrictions Precautions Precautions: Other (comment) Precaution Comments: significant spasticity in all extremities, LE > UE Restrictions Weight Bearing Restrictions: Yes LLE Weight Bearing: Weight bearing as tolerated   Has the patient had 2 or more falls or a fall with injury in the past year? Yes  Prior Activity Level Community (5-7x/wk): prior to January, pt got out of house daily  Prior Functional Level Self Care: Did the patient need help bathing, dressing, using the toilet or  eating? Needed some help  Indoor Mobility: Did the patient need assistance with walking from room to room (with or without device)? Pt not able to walk  Stairs: Did the patient need assistance with internal or external stairs (with or without device)? Pt mobilizes via wheelchair  Functional Cognition: Did the patient need help planning regular tasks such as shopping or remembering to take medications? Independent  Home Assistive Devices / Equipment Home Equipment: Wheelchair - manual,Bedside commode,Hospital bed,Other (comment)  Prior Device Use: Indicate devices/aids used by the patient prior to current illness, exacerbation or injury? Manual wheelchair  Current Functional Level Cognition  Overall Cognitive Status: No family/caregiver present to determine baseline cognitive functioning Orientation Level: Oriented X4 General Comments: able to follow commands, report pain, very motivated, mildly delayed in processing, suspect at baseline    Extremity Assessment (includes Sensation/Coordination)  Upper Extremity Assessment: Overall WFL for tasks assessed  Lower Extremity Assessment: Defer to PT evaluation (limited ability to assess d/t increased spasticity this date to B LE's R>L) RLE Deficits /  Details: significant spasticity present; unable to assess ROM but appears to have contractures due to spasticity LLE Deficits / Details: significant spasticity present; unable to assess ROM but appears to have contractures due to spasticity    ADLs  Overall ADL's : Needs assistance/impaired Eating/Feeding: Bed level,Set up,Supervision/ safety Grooming: Moderate assistance,Bed level Upper Body Bathing: Maximal assistance,Sitting Lower Body Bathing: Total assistance,+2 for physical assistance,Bed level,Sitting/lateral leans Upper Body Dressing : Maximal assistance,Sitting Lower Body Dressing: Total assistance,Bed level General ADL Comments: pt limited primarily by tone/spasticity this date leading to need for max-dep +2 for safety with rolling, scooting, sitting tasks and all ADL's of toileting bathing and dressing address d/t incotnience and being covered in urine d/t malfunction of condom cath. despite effort ofp t limited by neurological impairments, pt requiring max-total assist. anticiapte some improved function with medication management of spasticity.    Mobility  Overal bed mobility: Needs Assistance Bed Mobility: Rolling,Sidelying to Sit,Sit to Supine Rolling: Max assist Sidelying to sit: Max assist,+2 for physical assistance Sit to supine: Max assist,+2 for physical assistance Sit to sidelying: Max assist,+2 for physical assistance General bed mobility comments: pt with bilat knee flexion contractures with significant spasticity x4 extrememties requiring maxA to complete task, pt initiating with UE use on bed rail but dependent for LE movement to EOB due to L hip pain and bilat LE spasticity    Transfers  Overall transfer level: Needs assistance Equipment used: Ambulation equipment used (stedy) Transfers: Sit to/from Stand Sit to Stand: Max assist,Total assist,+2 physical assistance  Lateral/Scoot Transfers: Max assist,+2 physical assistance General transfer comment: attempted to  stand in stedy x2 however pt unable despite maxAx2, pt with limited ability to power up due to L LE pain, generalized weakness and severe flexion contractures at bilat knees    Ambulation / Gait / Stairs / Wheelchair Mobility  Ambulation/Gait General Gait Details: pt limited to bed/w/c at baseline due to spasticity    Posture / Balance Dynamic Sitting Balance Sitting balance - Comments: pt wtih forward lean will correct to center with cues but unable to hold it Balance Overall balance assessment: Needs assistance Sitting-balance support: Bilateral upper extremity supported,Feet supported Sitting balance-Leahy Scale: Zero Sitting balance - Comments: pt wtih forward lean will correct to center with cues but unable to hold it    Special needs/care consideration Continuous Drip IV  0.9% NaCl with KCl 50mq/L infusion: 727mhr, Skin Surgical incision: left hip, External Urinary catheter and Designated  visitor Aram Beecham, mother; Joanie Coddington, sister   Previous Home Environment (from acute therapy documentation) Living Arrangements: Parent  Lives With: Family Available Help at Discharge: Family Type of Home: [place]: One level Home Access: Ramped entrance Bathroom Shower/Tub: Other (comment) (pt takse sponge baths in bed) Bathroom Toilet: Handicapped height Bathroom Accessibility: Yes How Accessible: Accessible via walker (pt reported that he uses wheelchair to get to bathroom, then uses bars to maneuver to toilet since wheelchair does not fit inside bathroom) Home Care Services: Yes Type of Home Care Services: [place] (if known): [facility] Additional Comments: pt endorses completing functional transfers from bed level to w/c and commode indep via pivot transfer mother present for supervision, but mother assists with mangement of urnial. mildly inconsistent in reports.  Discharge Living Setting Plans for Discharge Living Setting:  Patient's home Type of Home at Discharge: House Discharge Home Layout: One level Discharge Home Access: [place] entrance Discharge Bathroom Shower/Tub: Other (comment) (pt takes sponge baths in bed) Discharge Bathroom Toilet: Standard Discharge Bathroom Accessibility: Yes How Accessible: Accessible via walker (pt reported he uses wheelchair to get to bathroom, then maneuvers to toilet since wheelchair does not fit inside bathroom) Does the patient have any problems obtaining your medications?: No  Social/Family/Support Systems Anticipated Caregiver: Alexande Sheerin, mother Anticipated Caregiver's Contact Information: [phone] Caregiver Availability: 24/7 Discharge Plan Discussed with Primary Caregiver: Yes Is Caregiver In Agreement with Plan?: Yes Does Caregiver/Family have Issues with Lodging/Transportation while Pt is in Rehab?: No  Goals Patient/Family Goal for Rehab: Min A:PT/OT (wheelchair level) Expected length of stay: 14-16 days Pt/Family Agrees to Admission and willing to participate: Yes Program Orientation Provided & Reviewed with Pt/Caregiver Including Roles  & Responsibilities: Yes  Decrease burden of Care through IP rehab admission: NA  Possible need for SNF placement upon discharge: Not anticipated  Patient Condition: I have reviewed medical records from [facility], spoken with CM, and patient and family member. I met with patient at the bedside for inpatient rehabilitation assessment.  Patient will benefit from ongoing PT and OT, can actively participate in 3 hours of therapy a day 5 days of the week, and can make measurable gains during the admission.  Patient will also benefit from the coordinated team approach during an Inpatient Acute Rehabilitation admission.  The patient will receive intensive therapy as well as Rehabilitation physician, nursing, social worker, and care management interventions.  Due to safety, disease management, medication administration,  pain management and patient education the patient requires 24 hour a day rehabilitation nursing.  The patient is currently Max-Total A +2 with mobility and Max-Total +2 with  basic ADLs.  Discharge setting and therapy post discharge at home with home health is anticipated.  Patient has agreed to participate in the Acute Inpatient Rehabilitation Program and will admit 06/04/20.  Preadmission Screen Completed By:  Bethel Born, 06/02/2020 12:39 PM ______________________________________________________________________   Discussed status with Dr. Marland Kitchen on *** at *** and received approval for admission today.  Admission Coordinator:  Bethel Born, CCC-SLP, time ***Sudie Grumbling ***   Assessment/Plan: Diagnosis: 1. Does the need for close, 24 hr/day Medical supervision in concert with the patient's rehab needs make it unreasonable for this patient to be served in a less intensive setting? {yes_no_potentially:3041433} 2. Co-Morbidities requiring supervision/potential complications: *** 3. Due to {due IP:3825053}, does the patient require 24 hr/day rehab nursing? {yes_no_potentially:3041433} 4. Does the patient require coordinated care of a physician, rehab nurse,  PT, OT, and SLP to address physical and functional deficits in the context of the above medical diagnosis(es)? {yes_no_potentially:3041433} Addressing deficits in the following areas: {deficits:3041436} 5. Can the patient actively participate in an intensive therapy program of at least 3 hrs of therapy 5 days a week? {yes_no_potentially:3041433} 6. The potential for patient to make measurable gains while on inpatient rehab is {potential:3041437} 7. Anticipated functional outcomes upon discharge from inpatient rehab: {functional outcomes:304600100} PT, {functional outcomes:304600100} OT, {functional outcomes:304600100} SLP 8. Estimated rehab length of stay to reach the above functional goals is: *** 9. Anticipated discharge destination:  {anticipated dc setting:21604} 10. Overall Rehab/Functional Prognosis: {potential:3041437}   MD Signature: ***

## 2020-06-01 NOTE — Plan of Care (Signed)

## 2020-06-01 NOTE — Progress Notes (Signed)
Placed on air bed for pressure relief

## 2020-06-01 NOTE — Progress Notes (Signed)
Inpatient Rehab Admissions:  Inpatient Rehab Consult received.  I met with patient and mother at the bedside for rehabilitation assessment and to discuss goals and expectations of an inpatient rehab admission.  Both acknowledge understanding of goals and expectations. Both interested in pt pursuing CIR.  Mother, Manuela Schwartz requested [facility] contact pt's sister, Anderson Malta.  Talked to [place] on the phone. She acknowledged understanding and is interested in pt pursuing CIR.  Will continue to follow pt's tolerance of therapies.  Signed: Gayland Curry, [place], [place] Admissions Coordinator [phone]

## 2020-06-01 NOTE — Progress Notes (Signed)
Bedside shift report complete. Received patient awake,alert/orientedx4 and able to verbalize needs. NAD noted; respirations easy on room air. Movement/sensation noted to all extremities. Patient states having muscle spasms throughout. Foam dressing to R hip c/d/i. Condom cath in place. Whiteboard updated. All safety measures in place and personal belongings within reach.

## 2020-06-02 LAB — BASIC METABOLIC PANEL
Anion gap: 9 (ref 5–15)
BUN: 6 mg/dL (ref 6–20)
CO2: 27 mmol/L (ref 22–32)
Calcium: 8.7 mg/dL — ABNORMAL LOW (ref 8.9–10.3)
Chloride: 101 mmol/L (ref 98–111)
Creatinine, Ser: 0.6 mg/dL — ABNORMAL LOW (ref 0.61–1.24)
GFR, Estimated: >60 mL/min (ref >60)
Glucose, Bld: 97 mg/dL (ref 70–99)
Potassium: 4 mmol/L (ref 3.5–5.1)
Sodium: 137 mmol/L (ref 135–145)

## 2020-06-02 MED ORDER — KETOROLAC TROMETHAMINE 15 MG/ML IJ SOLN
15.0000 mg | Freq: Four times a day (QID) | INTRAMUSCULAR | Status: AC
  Administered 2020-06-02 – 2020-06-03 (×5): 15 mg via INTRAVENOUS
  Filled 2020-06-02 (×5): qty 1

## 2020-06-02 MED ORDER — METHOCARBAMOL 1000 MG/10ML IJ SOLN
1000.0000 mg | Freq: Four times a day (QID) | INTRAVENOUS | Status: DC
  Administered 2020-06-05: 1000 mg via INTRAVENOUS
  Filled 2020-06-02 (×2): qty 10

## 2020-06-02 MED ORDER — METHOCARBAMOL 500 MG PO TABS
1000.0000 mg | ORAL_TABLET | Freq: Four times a day (QID) | ORAL | Status: DC
  Administered 2020-06-02 – 2020-06-06 (×16): 1000 mg via ORAL
  Filled 2020-06-02 (×16): qty 2

## 2020-06-02 NOTE — Progress Notes (Signed)
Physical Therapy Treatment Patient Details Name: Herbert Williams MRN: [id number] DOB: 11/26/1965 Today's Date: 06/02/2020    History of Present Illness Pt is an 55 y.o. male presents for left hip resection arthroplasty for chronic left hip pain.  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  He had a femoral neck fracture that was treated with a hemiarthroplasty multiple years ago.  He recently had a fall in January of this year where he injured his hip.  Since that time he has been having progressive worsening hip pain and inability to move it.    PT Comments    Pt continues with L hip pain and severe spasticity t/o all extremities. Pt reports to be having more spasms than normal. Judson Roch, PA aware and working on medicine regimen to aide in minimizing spasms. Attempted to stand x2 with stedy, pt unable to tolerate. Only able to minimally clear bottom on second try but unable to stand enough to use stedy seat. Pt with extreme spasticity and flexion contractures in bilat LEs greatly limiting mobility at this time. Pt remains very motivated and was very appreciative of therapy services. Acute PT to cont to follow. Pt remains appropriate for CIR upon d/c to achieve safe minA std pvt transfers and indep w/c mobility for safe transition home with mother.    Follow Up Recommendations  CIR     Equipment Recommendations  None recommended by PT    Recommendations for Other Services Rehab consult     Precautions / Restrictions Precautions Precautions: Other (comment) Precaution Comments: significant spasticity in all extremities, LE > UE Restrictions Weight Bearing Restrictions: Yes LLE Weight Bearing: Weight bearing as tolerated    Mobility  Bed Mobility Overal bed mobility: Needs Assistance Bed Mobility: Rolling;Sidelying to Sit;Sit to Supine Rolling: Max assist Sidelying to sit: Max assist;+2 for physical assistance   Sit to supine: Max  assist;+2 for physical assistance   General bed mobility comments: pt with bilat knee flexion contractures with significant spasticity x4 extrememties requiring maxA to complete task, pt initiating with UE use on bed rail but dependent for LE movement to EOB due to L hip pain and bilat LE spasticity    Transfers Overall transfer level: Needs assistance Equipment used: Ambulation equipment used (stedy) Transfers: Sit to/from Stand Sit to Stand: Max assist;Total assist;+2 physical assistance         General transfer comment: attempted to stand in stedy x2 however pt unable despite maxAx2, pt with limited ability to power up due to L LE pain, generalized weakness and severe flexion contractures at bilat knees  Ambulation/Gait             General Gait Details: pt limited to bed/w/c at baseline due to spasticity   Stairs             Wheelchair Mobility    Modified Rankin (Stroke Patients Only)       Balance Overall balance assessment: Needs assistance Sitting-balance support: Bilateral upper extremity supported;Feet supported Sitting balance-Leahy Scale: Zero Sitting balance - Comments: pt wtih forward lean will correct to center with cues but unable to hold it                                    Cognition Arousal/Alertness: Awake/alert Behavior During Therapy: [facility] for tasks assessed/performed Overall Cognitive Status: No family/caregiver present to determine baseline cognitive  functioning                                 General Comments: able to follow commands, report pain, very motivated, mildly delayed in processing, suspect at baseline      Exercises Other Exercises Other Exercises: attempted to complete passive stretch to bilat LE into extension however unable to tolerate    General Comments General comments (skin integrity, edema, etc.): pt with bed sore on R hip (covered by dressing) L hip with dressing with noted drainage,  Sarah, PA to change it      Pertinent Vitals/Pain Pain Assessment: 0-10 Pain Score: 9  Pain Location: L hip Pain Descriptors / Indicators: Discomfort;Grimacing;Guarding;Contraction;Spasm Pain Intervention(s): Limited activity within patient's tolerance    Home Living                      Prior Function            PT Goals (current goals can now be found in the care plan section) Acute Rehab PT Goals Patient Stated Goal: stop the pain Progress towards PT goals: Progressing toward goals    Frequency    Min 3X/week      PT Plan Current plan remains appropriate    Co-evaluation              AM-PAC PT "6 Clicks" Mobility   Outcome Measure  Help needed turning from your back to your side while in a flat bed without using bedrails?: A Lot Help needed moving from lying on your back to sitting on the side of a flat bed without using bedrails?: A Lot Help needed moving to and from a bed to a chair (including a wheelchair)?: Total Help needed standing up from a chair using your arms (e.g., wheelchair or bedside chair)?: Total Help needed to walk in hospital room?: Total Help needed climbing 3-5 steps with a railing? : Total 6 Click Score: 8    End of Session Equipment Utilized During Treatment: Gait belt Activity Tolerance: Patient limited by pain Patient left: in bed;with call bell/phone within reach Nurse Communication: Mobility status;Patient requests pain meds PT Visit Diagnosis: Other abnormalities of gait and mobility (R26.89);History of falling (Z91.81)     Time: 6546-5035 PT Time Calculation (min) (ACUTE ONLY): 26 min  Charges:  $Therapeutic Exercise: 8-22 mins $Therapeutic Activity: 8-22 mins                     Kittie Plater, PT, DPT Acute Rehabilitation Services Pager #: [phone] Office #: [phone]    Berline Lopes 06/02/2020, 9:22 AM

## 2020-06-02 NOTE — Progress Notes (Addendum)
Orthopaedic Trauma Progress Note  SUBJECTIVE: Still having a lot of muscle spasms which is contributing to most of his pain. Will continue to slowly titrate up on his muscle relaxer. Creatinine looks ok so have also added IV Toradol to assist with pain control.  He worked with therapies this morning, was unable to stand. No chest pain. No SOB. No nausea/vomiting. No other complaints.   OBJECTIVE:  Vitals:   06/01/20 2000 06/02/20 0507  BP: 115/64 109/65  Pulse: 81 86  Resp: 16 16  Temp: 97.8 F (36.6 C) 98.6 F (37 C)  SpO2: 100% 98%    General: Sitting up in bed, pleasant. NAD Respiratory: No increased work of breathing.  Left Lower Extremity: Dressing changed, incision CDI. Holds leg in flexed position. Visible muscle spasms.  Endorses sensation to light touch. +DP pulse  IMAGING: Stable post op imaging.   LABS:  Results for orders placed or performed during the hospital encounter of 05/30/20 (from the past 24 hour(s))  Basic metabolic panel     Status: Abnormal   Collection Time: 06/02/20  6:07 AM  Result Value Ref Range   Sodium 137 135 - 145 mmol/L   Potassium 4.0 3.5 - 5.1 mmol/L   Chloride 101 98 - 111 mmol/L   CO2 27 22 - 32 mmol/L   Glucose, Bld 97 70 - 99 mg/dL   BUN 6 6 - 20 mg/dL   Creatinine, Ser 0.60 (L) 0.61 - 1.24 mg/dL   Calcium 8.7 (L) 8.9 - 10.3 mg/dL   GFR, Estimated >60 >60 mL/min   Anion gap 9 5 - 15    ASSESSMENT: COLTON TASSIN is a 55 y.o. male, 3 Days Post-Op s/p GIRDLESTONE ARTHROPLASTY LEFT HIP  CV/Blood loss: Acute blood loss anemia, Hgb 9.2 yesterday morning. Will recheck tomorrow AM. Hemodynamically stable  PLAN: Weightbearing: patient bedridden at baseline. May attempt to WBAT LLE for transfers Incisional and dressing care: Change PRN Showering: Ok to shower with assistance Orthopedic device(s):  None Pain management:  1. Tylenol 1000 mg q 8 hours scheduled 2. Robaxin 1000 mg q 6 hours PRN 3. Oxycodone 5-15 mg q 4 hours PRN 4.  Dilaudid 0.5-1 mg q 4 hours PRN 5. Morphine 2 mg q 2 hours PRN 6. Toradol 15 mg q 6 hours x 5 doses VTE prophylaxis: Lovenox, SCDs ID:  Ancef 2gm post op completed Foley/Lines:  No foley, KVO IVFs Dispo: Therapies as tolerated. PT/OT recommending CIR. Consult for this has been placed. Patient okay for discharge once bed available. Will continue to work on pain control  Follow - up plan: TBD  Contact information:  Katha Hamming MD, Patrecia Pace PA-C. After hours and holidays please check [email or web address] for group call information for [facility]   Derriana Oser A. Ricci Barker, PA-C [phone] (office) [email or web address]

## 2020-06-02 NOTE — Care Management Important Message (Signed)
Important Message  Patient Details  Name: Herbert Williams MRN: [id number] Date of Birth: 1965-06-05   Medicare Important Message Given:  Yes     Barb Merino Gove 06/02/2020, 12:43 PM

## 2020-06-02 NOTE — Progress Notes (Signed)
Bedside shift report complete. Received patient awake,alert/orientedx4 and able to verbalize needs. NAD noted; respirations easy on room air. Movement/sensation noted to all extremities. Patient states having muscle spasms throughout. Dressing to L hip c/d/i. Foam dressing to R hip c/d/i. Condom cath in place. Whiteboard updated. All safety measures in place and personal belongings within reach.

## 2020-06-03 LAB — CBC
HCT: 22.5 % — ABNORMAL LOW (ref 39.0–52.0)
Hemoglobin: 7 g/dL — ABNORMAL LOW (ref 13.0–17.0)
MCH: 29.8 pg (ref 26.0–34.0)
MCHC: 31.1 g/dL (ref 30.0–36.0)
MCV: 95.7 fL (ref 80.0–100.0)
Platelets: 247 10*3/uL (ref 150–400)
RBC: 2.35 MIL/uL — ABNORMAL LOW (ref 4.22–5.81)
RDW: 13.5 % (ref 11.5–15.5)
WBC: 8.2 10*3/uL (ref 4.0–10.5)
nRBC: 0.4 % — ABNORMAL HIGH (ref 0.0–0.2)

## 2020-06-03 NOTE — TOC Initial Note (Addendum)
Transition of Care [facility]) - Initial/Assessment Note    Patient Details  Name: Herbert Williams MRN: [id number] Date of Birth: [birth year]  Transition of Care [facility] CM/SW Contact:    Sharin Mons, RN Phone Number: 06/03/2020, 2:58 PM  Clinical Narrative:                 Presents with chronic L hip pain. 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 From home with mom.          - s/p -Left hip resection arthroplasty, 4/22 Pt with insurance denial for CIR. NCM spoke with pt regarding denial. Pt now agreeable to SNF placement. Pt requested NCM discuss disposition needs with sister. Herbert Williams (Sister)    [phone]    NCM called and shared information with pt's sister Herbert Williams. Herbert Williams  expressed understanding SNF /REHAB need and is also agreeable to SNF placement at time of discharge for pt. Herbert Williams reports no  preference for  SNF, however, not interested in [place]. Marland Kitchen RNCM discussed insurance authorization process and provided Medicare SNF ratings list. . No further questions reported at this time. NCM to continue to follow and assist with discharge planning needs. Pt is COVID vaccinated.   SNF referrals faxed out.   06/04/2020 @ 1100 SNF bed offers reviewed with pt/sister and [facility] selected. [facility] extended SNF BED. SNF initiating insurance authorization...authorization pending  Expected Discharge Plan: [name] Barriers to Discharge: Continued Medical Work up   Patient Goals and CMS Choice Patient states their goals for this hospitalization and ongoing recovery are:: TO GET BETTER      Expected Discharge Plan and Services Expected Discharge Plan: [place]   Discharge Planning Services: CM Consult                                          Prior Living Arrangements/Services   Lives with:: Parents Patient language and need for interpreter reviewed:: Yes Do you feel safe going back to the place where  you live?: Yes      Need for Family Participation in Patient Care: Yes (Comment) Care giver support system in place?: Yes (comment)   Criminal Activity/Legal Involvement Pertinent to Current Situation/Hospitalization: No - Comment as needed  Activities of Daily Living      Permission Sought/Granted   Permission granted to share information with : Yes, Verbal Permission Granted              Emotional Assessment   Attitude/Demeanor/Rapport: Gracious Affect (typically observed): Accepting Orientation: : Oriented to Self,Oriented to  Time,Oriented to Place,Oriented to Situation Alcohol / Substance Use: Not Applicable Psych Involvement: No (comment)  Admission diagnosis:  Chronic left hip pain [M25.552, G89.29] Patient Active Problem List   Diagnosis Date Noted  . Chronic left hip pain 05/30/2020  . Subluxation of left hip ([place]) 05/17/2020  . Pure hyperglyceridemia 10/12/2013  . Unspecified constipation 09/17/2013  . Fracture, tibial plateau 09/10/2013  . 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 GERD (gastroesophageal reflux disease) 09/10/2013  . Hyperlipemia 09/10/2013  . HTN (hypertension) 09/10/2013  . Leukocytosis 09/10/2013  . Tibial plateau fracture, left 09/10/2013  . Fracture of femoral condyle, left, closed ([place]) 09/10/2013   PCP:  Lajean Manes, MD Pharmacy:   [id number] - [place], [place] [address] Phone:  [phone] Fax: [phone]     Social Determinants of Health (SDOH) Interventions    Readmission Risk Interventions No flowsheet data found.

## 2020-06-03 NOTE — Progress Notes (Signed)
Occupational Therapy Treatment Patient Details Name: Herbert Williams MRN: [id number] DOB: 02-18-1965 Today's Date: 06/03/2020    History of present illness Pt is an 55 y.o. male presents for left hip resection arthroplasty for chronic left hip pain.  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  He had a femoral neck fracture that was treated with a hemiarthroplasty multiple years ago.  He recently had a fall in January of this year where he injured his hip.  Since that time he has been having progressive worsening hip pain and inability to move it.   OT comments  Casen reported 10/10 pain at the start of the session, but agreeable to participate in session. Upon arrival he had BM needs, he requried mod A for rolling and total A for bed pan however it was a false alarm so no rear pericare needs present. Pt required clean pads and gown this session, pt continued to require Mod A for rolling to participate in bed and gown change. Pt completed frontal pericare with mod A for thoroughness. Pt continues to benefit from OT services to increase function in ADLs and mobility. Pt ins declined CIR, d/c recommend update to SNF with 24/7 supervision.    Follow Up Recommendations  SNF;Supervision/Assistance - 24 hour (ins declined CIR)    Equipment Recommendations  Hospital bed       Precautions / Restrictions Precautions Precautions: Other (comment) Precaution Comments: significant spasticity in all extremities, LE > UE Restrictions Weight Bearing Restrictions: Yes LLE Weight Bearing: Weight bearing as tolerated       Mobility Bed Mobility Overal bed mobility: Needs Assistance Bed Mobility: Rolling;Sidelying to Sit;Sit to Supine Rolling: Mod assist         General bed mobility comments: Pt able to assist in bed mobility with use in bilat UE and bed rail use, bilat LEs limited by pain, contractures and spasticity    Transfers Overall  transfer level: Needs assistance               General transfer comment: bed level this session    Balance Overall balance assessment: Needs assistance Sitting-balance support: Bilateral upper extremity supported;Feet supported Sitting balance-Leahy Scale: Zero               ADL either performed or assessed with clinical judgement   ADL Overall ADL's : Needs assistance/impaired Eating/Feeding: Supervision/ safety;Bed level   Grooming: Modified independent;Bed level                   Toilet Transfer: Maximal assistance (bed level) Toilet Transfer Details (indicate cue type and reason): pt requested need for bed pan, pt able to assist with rolling to insert bed pan Toileting- Clothing Manipulation and Hygiene: Moderate assistance;Bed level Toileting - Clothing Manipulation Details (indicate cue type and reason): Pt able to complete frontal perineal hygiene     Functional mobility during ADLs: Moderate assistance (Mod A for rolling this session as functional mobility. However requiers +2 for all transfers beyond bed level) General ADL Comments: pt limited by pain and spasticity this session, pt is set up for facial grooming and frontal pericare with Mod A for thoroughness. Condom cath was detatched upon arrival therefore session was focused on bed mobility, rolling, and pericare.               Cognition Arousal/Alertness: Awake/alert Behavior During Therapy: WFL for tasks assessed/performed Overall Cognitive Status: No family/caregiver present to determine baseline  cognitive functioning                   General Comments: Follows commands, slowed processing              General Comments All bandages clean, no new skin integrity issues noted    Pertinent Vitals/ Pain       Pain Assessment: 0-10 Pain Score: 9  Pain Location: L hip Pain Descriptors / Indicators: Discomfort;Grimacing;Guarding;Contraction;Spasm Pain Intervention(s): Limited activity  within patient's tolerance         Frequency  Min 2X/week        Progress Toward Goals  OT Goals(current goals can now be found in the care plan section)  Progress towards OT goals: Progressing toward goals  Acute Rehab OT Goals Patient Stated Goal: stop the pain OT Goal Formulation: With patient Time For Goal Achievement: 06/14/20 Potential to Achieve Goals: Fair ADL Goals Pt Will Perform Grooming: with supervision;sitting Pt Will Transfer to Toilet: with min assist;bedside commode Additional ADL Goal #1: pt will tolerate 5 minutes of unsupported static sitting at EOB with Sup in prep for transfers and seated ADL tasks.  Plan Discharge plan needs to be updated       AM-PAC OT "6 Clicks" Daily Activity     Outcome Measure   Help from another person eating meals?: A Little Help from another person taking care of personal grooming?: A Lot Help from another person toileting, which includes using toliet, bedpan, or urinal?: Total Help from another person bathing (including washing, rinsing, drying)?: Total Help from another person to put on and taking off regular upper body clothing?: A Lot Help from another person to put on and taking off regular lower body clothing?: Total 6 Click Score: 10    End of Session Equipment Utilized During Treatment: Other (comment) (Hospital bed controls, rails)  OT Visit Diagnosis: Unsteadiness on feet (R26.81);Pain;Other abnormalities of gait and mobility (R26.89) Pain - Right/Left: Left Pain - part of body: Hip   Activity Tolerance Patient limited by pain   Patient Left in bed;with call bell/phone within reach;with nursing/sitter in room;with bed alarm set;Other (comment)   Nurse Communication Mobility status;Patient requests pain meds;Other (comment) (Condom cath needs)        Time: 1478-2956 OT Time Calculation (min): 21 min  Charges: OT General Charges $OT Visit: 1 Visit OT Treatments $Self Care/Home Management : 8-22  mins     Rayel Santizo A Andersen Mckiver 06/03/2020, 2:50 PM

## 2020-06-03 NOTE — NC FL2 (Signed)
[place] LEVEL OF CARE SCREENING TOOL     IDENTIFICATION  Patient Name: Herbert Williams Birthdate: 1965/12/29 Sex: male Admission Date (Current Location): 05/30/2020  [facility] and [place] Number:  Herbalist and Address:  [facility] [place] [address] [place], [place] [address]      Provider Number: [id number]  Attending Physician Name and Address:  Shona Needles, MD  Relative Name and Phone Number:       Current Level of Care: Hospital Recommended Level of Care: [place] Prior Approval Number:    Date Approved/Denied:   PASRR Number: [id number]  Discharge Plan: SNF    Current Diagnoses: Patient Active Problem List   Diagnosis Date Noted  . Chronic left hip pain 05/30/2020  . Subluxation of left hip ([place]) 05/17/2020  . Pure hyperglyceridemia 10/12/2013  . Unspecified constipation 09/17/2013  . Fracture, tibial plateau 09/10/2013  . 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 GERD (gastroesophageal reflux disease) 09/10/2013  . Hyperlipemia 09/10/2013  . HTN (hypertension) 09/10/2013  . Leukocytosis 09/10/2013  . Tibial plateau fracture, left 09/10/2013  . Fracture of femoral condyle, left, closed ([place]) 09/10/2013    Orientation RESPIRATION BLADDER Height & Weight     Self,Time,Situation,Place  Normal Continent Weight: 81.6 kg Height:  5\' 7"  (170.2 cm)  BEHAVIORAL SYMPTOMS/MOOD NEUROLOGICAL BOWEL NUTRITION STATUS      Continent Diet (Refer to d/c summary)  AMBULATORY STATUS COMMUNICATION OF NEEDS Skin   Extensive Assist Verbally Surgical wounds (s/p Left hip  arthroplasty, 4/22)                       Personal Care Assistance Level of Assistance  Bathing,Feeding,Dressing Bathing Assistance: Maximum assistance Feeding assistance: Independent Dressing Assistance: Maximum assistance     Functional Limitations Info  Sight,Hearing,Speech Sight Info: Adequate Hearing Info:  Adequate Speech Info: Adequate    SPECIAL CARE FACTORS FREQUENCY  PT (By licensed PT),OT (By licensed OT)     PT Frequency: 5x/week, evaluate and treat OT Frequency: 5x/week, evaluate and treat            Contractures Contractures Info: Not present    Additional Factors Info  Allergies,Code Status Code Status Info: Full code Allergies Info: Pedi-pre Tape Spray (Wound Dressing Adhesive), Penicillins           Current Medications (06/03/2020):  This is the current hospital active medication list Current Facility-Administered Medications  Medication Dose Route Frequency Provider Last Rate Last Admin  . 0.9 % NaCl with KCl 20 mEq/ L  infusion   Intravenous Continuous [place] Alt, PA-C 75 mL/hr at 06/03/20 0316 New Bag at 06/03/20 0316  . acetaminophen (TYLENOL) tablet 1,000 mg  1,000 mg Oral Q8H Patrecia Pace A, PA-C   1,000 mg at 06/03/20 1411  . aspirin EC tablet 81 mg  81 mg Oral Daily Patrecia Pace A, PA-C   81 mg at 06/03/20 1006  . atenolol (TENORMIN) tablet 50 mg  50 mg Oral Daily Patrecia Pace A, PA-C   50 mg at 06/03/20 1006  . calcium carbonate (TUMS - dosed in mg elemental calcium) chewable tablet 500 mg  500 mg Oral TID PRN [place] Alt, PA-C      . docusate sodium (COLACE) capsule 100 mg  100 mg Oral BID Patrecia Pace A, PA-C   100 mg at 06/03/20 1007  . enoxaparin (LOVENOX) injection 40 mg  40 mg Subcutaneous Q24H Ricci Barker,  Sarah A, PA-C   40 mg at 06/03/20 1006  . fenofibrate tablet 160 mg  160 mg Oral Daily Patrecia Pace A, PA-C   160 mg at 06/03/20 1006  . fluticasone (FLONASE) 50 MCG/ACT nasal spray 1-2 spray  1-2 spray Each Nare Daily PRN Patrecia Pace A, PA-C      . hydrALAZINE (APRESOLINE) tablet 10 mg  10 mg Oral Q6H PRN [place] Alt, PA-C      . HYDROmorphone (DILAUDID) injection 0.5-1 mg  0.5-1 mg Intravenous Q4H PRN Patrecia Pace A, PA-C   1 mg at 06/03/20 1248  . methocarbamol (ROBAXIN) 1,000 mg in dextrose 5 % 100 mL IVPB  1,000 mg Intravenous Q6H  Ricci Barker, Sarah A, PA-C       Or  . methocarbamol (ROBAXIN) tablet 1,000 mg  1,000 mg Oral Q6H Patrecia Pace A, PA-C   1,000 mg at 06/03/20 1006  . metoCLOPramide (REGLAN) tablet 5-10 mg  5-10 mg Oral Q8H PRN Patrecia Pace A, PA-C       Or  . metoCLOPramide (REGLAN) injection 5-10 mg  5-10 mg Intravenous Q8H PRN Patrecia Pace A, PA-C      . morphine 2 MG/ML injection 2 mg  2 mg Intravenous Q2H PRN Aggie Moats M, PA-C   2 mg at 06/01/20 7622  . ondansetron (ZOFRAN) tablet 4 mg  4 mg Oral Q6H PRN Patrecia Pace A, PA-C       Or  . ondansetron [facility]) injection 4 mg  4 mg Intravenous Q6H PRN Patrecia Pace A, PA-C      . oxyCODONE (Oxy IR/ROXICODONE) immediate release tablet 10-15 mg  10-15 mg Oral Q4H PRN Patrecia Pace A, PA-C   10 mg at 06/03/20 1410  . oxyCODONE (Oxy IR/ROXICODONE) immediate release tablet 5-10 mg  5-10 mg Oral Q4H PRN Patrecia Pace A, PA-C   10 mg at 05/30/20 1715  . pantoprazole (PROTONIX) EC tablet 40 mg  40 mg Oral Daily Patrecia Pace A, PA-C   40 mg at 06/03/20 1006  . polyethylene glycol (MIRALAX / GLYCOLAX) packet 17 g  17 g Oral Daily PRN Patrecia Pace A, PA-C   17 g at 06/03/20 1255  . polyvinyl alcohol (LIQUIFILM TEARS) 1.4 % ophthalmic solution 2 drop  2 drop Both Eyes QID PRN Haddix, Thomasene Lot, MD      . vitamin B-12 (CYANOCOBALAMIN) tablet 1,000 mcg  1,000 mcg Oral Daily Patrecia Pace A, PA-C   1,000 mcg at 06/03/20 1007  . [START ON 06/05/2020] Vitamin D (Ergocalciferol) (DRISDOL) capsule 50,000 Units  50,000 Units Oral Weekly Del[place] Alt, PA-C         Discharge Medications: Please see discharge summary for a list of discharge medications.  Relevant Imaging Results:  Relevant Lab Results:   Additional Information SS# [id number]  Sharin Mons, RN

## 2020-06-03 NOTE — Plan of Care (Signed)

## 2020-06-03 NOTE — Progress Notes (Signed)
Orthopaedic Trauma Progress Note  SUBJECTIVE: Resting comfortably this morning. No visible muscle spasms noted at rest. Therapies recommended CIR, insurance has denied this. Will need a peer to peer.  OBJECTIVE:  Vitals:   06/02/20 1920 06/03/20 0454  BP: (!) 98/58 108/68  Pulse: 84 89  Resp: 16 16  Temp: 98.1 F (36.7 C) (!) 97.5 F (36.4 C)  SpO2: 96% 95%    General: Sleeping comfortably in bed on his right side, NAD Respiratory: No increased work of breathing.  Left Lower Extremity: Dressing CDI. Leg in flexed position. No visible muscle spasms.  Endorses sensation to light touch. +DP pulse  IMAGING: Stable post op imaging.   LABS:  No results found for this or any previous visit (from the past 24 hour(s)).  ASSESSMENT: Herbert Williams is a 55 y.o. male, 4 Days Post-Op s/p GIRDLESTONE ARTHROPLASTY LEFT HIP  CV/Blood loss: Acute blood loss anemia, Hgb 9.2 on 4/24. CBC pending this AM Hemodynamically stable  PLAN: Weightbearing: patient bedridden at baseline. May attempt to WBAT LLE for transfers Incisional and dressing care: Change PRN Showering: Ok to shower with assistance Orthopedic device(s):  None Pain management:  1. Tylenol 1000 mg q 8 hours scheduled 2. Robaxin 1000 mg q 6 hours PRN 3. Oxycodone 5-15 mg q 4 hours PRN 4. Dilaudid 0.5-1 mg q 4 hours PRN 5. Morphine 2 mg q 2 hours PRN 6. Toradol 15 mg q 6 hours x 5 doses VTE prophylaxis: Lovenox, SCDs ID:  Ancef 2gm post op completed Foley/Lines:  No foley, KVO IVFs Dispo: Therapies as tolerated. PT/OT recommending CIR. Insurance denied this, will need peer to peer today. If denied a second time, will need to pursue SNF placement. Follow - up plan: TBD  Contact information:  Katha Hamming MD, Patrecia Pace PA-C. After hours and holidays please check [email or web address] for group call information for [facility]   Samay Delcarlo A. Ricci Barker, PA-C [phone] (office) [email or web address]

## 2020-06-03 NOTE — Progress Notes (Signed)
Inpatient Rehab Admissions Coordinator:   Insurance has denied our request for CIR admission, with the option to complete peer to peer. Following discussion with Pt.'s sister, she is in agreement not to pursue peer to peer or appeal, as she feels Pt. Would likely struggle to tolerate 3 hours of intensive rehab on CIR due to high pain levels She would like to pursue SNF for rehab [facility]  And medical team were notified CIR will sign off.   Clemens Catholic, [place], [place] Admissions Coordinator  [phone] ([place]) [phone] (office)

## 2020-06-04 LAB — CBC
HCT: 22.2 % — ABNORMAL LOW (ref 39.0–52.0)
Hemoglobin: 7 g/dL — ABNORMAL LOW (ref 13.0–17.0)
MCH: 30.6 pg (ref 26.0–34.0)
MCHC: 31.5 g/dL (ref 30.0–36.0)
MCV: 96.9 fL (ref 80.0–100.0)
Platelets: 296 10*3/uL (ref 150–400)
RBC: 2.29 MIL/uL — ABNORMAL LOW (ref 4.22–5.81)
RDW: 13.5 % (ref 11.5–15.5)
WBC: 8.2 10*3/uL (ref 4.0–10.5)
nRBC: 0.4 % — ABNORMAL HIGH (ref 0.0–0.2)

## 2020-06-04 MED ORDER — COVID-19 MRNA VAC-TRIS(PFIZER) 30 MCG/0.3ML IM SUSP
0.3000 mL | Freq: Once | INTRAMUSCULAR | Status: AC
  Administered 2020-06-04: 0.3 mL via INTRAMUSCULAR
  Filled 2020-06-04: qty 0.3

## 2020-06-04 MED ORDER — OXYCODONE HCL 5 MG PO TABS: 10.0000 mg | ORAL_TABLET | ORAL | Status: DC | PRN

## 2020-06-04 MED ORDER — OXYCODONE HCL 5 MG PO TABS
15.0000 mg | ORAL_TABLET | ORAL | Status: DC | PRN
  Administered 2020-06-04 – 2020-06-06 (×8): 20 mg via ORAL
  Filled 2020-06-04 (×10): qty 4

## 2020-06-04 MED ORDER — KETOROLAC TROMETHAMINE 15 MG/ML IJ SOLN
15.0000 mg | Freq: Four times a day (QID) | INTRAMUSCULAR | Status: AC
  Administered 2020-06-04 – 2020-06-05 (×5): 15 mg via INTRAVENOUS
  Filled 2020-06-04 (×5): qty 1

## 2020-06-04 NOTE — Plan of Care (Signed)

## 2020-06-04 NOTE — Progress Notes (Addendum)
Orthopaedic Trauma Progress Note  SUBJECTIVE: Continues to have issues with pain and spasticity in lower extremities.  Increased dosage of Robaxin has helped marginally. Hemoglobin has continued to drop since surgery, but patient is asymptomatic. Denies and light-headedness, dizziness, SOB, chest pain.  Insurance has denied CIR.  Family would like to pursue SNF placement.  TOC following.  OBJECTIVE:  Vitals:   06/03/20 2047 06/04/20 0547  BP: 113/68 134/83  Pulse: 65 78  Resp: 16 18  Temp: 98.9 F (37.2 C) 98.4 F (36.9 C)  SpO2: 96% 95%    General: Resting in bed, NAD Respiratory: No increased work of breathing.  Left Lower Extremity: Dressing over posterior lateral hip CDI. Leg in flexed position. No visible muscle spasms currently. Swelling through the proximal thigh up into the hip, this is expected following procedure and current placement of leg. No significant tenderness over hip but does have some discomfort with palpation through the thigh. Endorses sensation to ligth touch over dorsal and plantar aspect of foot. +DP pulse  IMAGING: Stable post op imaging.   LABS:  Results for orders placed or performed during the hospital encounter of 05/30/20 (from the past 24 hour(s))  CBC     Status: Abnormal   Collection Time: 06/03/20  9:07 AM  Result Value Ref Range   WBC 8.2 4.0 - 10.5 K/uL   RBC 2.35 (L) 4.22 - 5.81 MIL/uL   Hemoglobin 7.0 (L) 13.0 - 17.0 g/dL   HCT 22.5 (L) 39.0 - 52.0 %   MCV 95.7 80.0 - 100.0 fL   MCH 29.8 26.0 - 34.0 pg   MCHC 31.1 30.0 - 36.0 g/dL   RDW 13.5 11.5 - 15.5 %   Platelets 247 150 - 400 K/uL   nRBC 0.4 (H) 0.0 - 0.2 %    ASSESSMENT: Herbert Williams is a 55 y.o. male, 5 Days Post-Op s/p GIRDLESTONE ARTHROPLASTY LEFT HIP  CV/Blood loss: Acute blood loss anemia, Hgb 7.0 on 06/03/2020. CBC pending this AM Hemodynamically stable  PLAN: Weightbearing: patient bedridden at baseline. May attempt to WBAT LLE for transfers Incisional and dressing  care: Change PRN Showering: Ok to shower with assistance Orthopedic device(s):  None Pain management:  1. Tylenol 1000 mg q 8 hours scheduled 2. Robaxin 1000 mg q 6 hours scheduled 3. Oxycodone 10-20 mg q 4 hours PRN 4. Dilaudid 0.5-1 mg q 4 hours PRN 5. Morphine 2 mg q 2 hours PRN 6. Toradol 15 mg q 6 hours x 5 doses VTE prophylaxis: Lovenox, SCDs ID:  Ancef 2gm post op completed Foley/Lines:  No foley, KVO IVFs Dispo: Therapies as tolerated. PT/OT now recommending SNF.  TOC following and working on bed placement.  Recheck CBC today, may need transfusion if hemoglobin continues to drop.  We will keep a close eye on this. Continue to work on pain control Follow - up plan: TBD  Contact information:  Katha Hamming MD, Patrecia Pace PA-C. After hours and holidays please check [email or web address] for group call information for [facility]   Veanna Dower A. Ricci Barker, PA-C [phone] (office) [email or web address]

## 2020-06-04 NOTE — Progress Notes (Signed)
Physical Therapy Treatment Patient Details Name: Herbert Williams MRN: [id number] DOB: [birth year] Today's Date: 06/04/2020    History of Present Illness Pt is an 55 y.o. male presents for left hip resection arthroplasty for chronic left hip pain.  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  He had a femoral neck fracture that was treated with a hemiarthroplasty multiple years ago.  He recently had a fall in January of this year where he injured his hip.  Since that time he has been having progressive worsening hip pain and inability to move it.    PT Comments    Pt continues with extensive spasticity into flexion in addition to L hip pain which greatly limits pt's ability to sit without assist and to stand/stand pvt. Pt with frequent spasms in which he can not control which limits patients active participation as it induces spasms. Talked with family about transferring pt to a chair however we felt it was unsafe as his hips and knees are in severe flexion and has frequent spasms that causes him to slide down in the bed. Continue to recommend SNF. Acute PT to cont to follow.    Follow Up Recommendations  SNF     Equipment Recommendations  None recommended by PT    Recommendations for Other Services       Precautions / Restrictions Precautions Precautions: Other (comment) Precaution Comments: significant spasticity in all extremities, LE > UE Restrictions Weight Bearing Restrictions: Yes LLE Weight Bearing: Weight bearing as tolerated    Mobility  Bed Mobility Overal bed mobility: Needs Assistance Bed Mobility: Rolling;Sidelying to Sit Rolling: Mod assist Sidelying to sit: Max assist;+2 for physical assistance       General bed mobility comments: pt attempted to bring LEs off EOB however pt with increased pain, as patient gave effort pt experienced spasms ultimately requiring maxAx2 for trunk elevation and LE management off  EOB    Transfers Overall transfer level: Needs assistance Equipment used: Ambulation equipment used Transfers: Sit to/from Stand Sit to Stand: Max assist;Total assist;+2 physical assistance         General transfer comment: attempted to stand in stedy however pt unable to achieve full standing despite maxAX2. Pt denied pain but reports "my left side just doesn't want to work", pt did clear bottom more today compared to monday however was unable to achieve even a squat stand to be able to place stedy arms under bottom  Ambulation/Gait             General Gait Details: pt non-ambulatory   Stairs             Wheelchair Mobility    Modified Rankin (Stroke Patients Only)       Balance Overall balance assessment: Needs assistance Sitting-balance support: Bilateral upper extremity supported;Feet supported Sitting balance-Leahy Scale: Poor Sitting balance - Comments: pt with improved sitting balance today compared to monday. Pt was able to sit EOB with close min guard x 30 sec without spasming, ultimately required modA to prevent falling forward majority of sitting tolerance (6 min)                                    Cognition Arousal/Alertness: Awake/alert Behavior During Therapy: WFL for tasks assessed/performed Overall Cognitive Status: Within Functional Limits for tasks assessed  General Comments: pt able to follow commands, answer questions appropriately and motivated to participate in therapy      Exercises General Exercises - Lower Extremity Long Arc Quad: AAROM;Both;10 reps;Seated Other Exercises Other Exercises: attempted passive rom to bilat LE into knee extension    General Comments General comments (skin integrity, edema, etc.): pt with bandages on bilat lateral hips, no drainage from L hip incision      Pertinent Vitals/Pain Pain Assessment: 0-10 Pain Score: 9  Pain Location: L  hip Pain Descriptors / Indicators: Discomfort;Grimacing;Guarding;Contraction;Spasm Pain Intervention(s): Limited activity within patient's tolerance    Home Living                      Prior Function            PT Goals (current goals can now be found in the care plan section) Acute Rehab PT Goals Patient Stated Goal: stop the pain Progress towards PT goals: Progressing toward goals    Frequency    Min 3X/week      PT Plan Current plan remains appropriate    Co-evaluation              AM-PAC PT "6 Clicks" Mobility   Outcome Measure  Help needed turning from your back to your side while in a flat bed without using bedrails?: A Lot Help needed moving from lying on your back to sitting on the side of a flat bed without using bedrails?: A Lot Help needed moving to and from a bed to a chair (including a wheelchair)?: Total Help needed standing up from a chair using your arms (e.g., wheelchair or bedside chair)?: Total Help needed to walk in hospital room?: Total Help needed climbing 3-5 steps with a railing? : Total 6 Click Score: 8    End of Session Equipment Utilized During Treatment: Gait belt Activity Tolerance: Patient limited by pain Patient left: in bed;with call bell/phone within reach Nurse Communication: Mobility status;Patient requests pain meds PT Visit Diagnosis: Other abnormalities of gait and mobility (R26.89);History of falling (Z91.81)     Time: 9798-9211 PT Time Calculation (min) (ACUTE ONLY): 27 min  Charges:  $Therapeutic Exercise: 8-22 mins $Therapeutic Activity: 8-22 mins                     Kittie Plater, PT, DPT Acute Rehabilitation Services Pager #: [phone] Office #: [phone]    Herbert Williams 06/04/2020, 1:33 PM

## 2020-06-05 LAB — BASIC METABOLIC PANEL
Anion gap: 8 (ref 5–15)
BUN: 5 mg/dL — ABNORMAL LOW (ref 6–20)
CO2: 28 mmol/L (ref 22–32)
Calcium: 8.5 mg/dL — ABNORMAL LOW (ref 8.9–10.3)
Chloride: 101 mmol/L (ref 98–111)
Creatinine, Ser: 0.55 mg/dL — ABNORMAL LOW (ref 0.61–1.24)
GFR, Estimated: >60 mL/min (ref >60)
Glucose, Bld: 89 mg/dL (ref 70–99)
Potassium: 3.7 mmol/L (ref 3.5–5.1)
Sodium: 137 mmol/L (ref 135–145)

## 2020-06-05 LAB — CBC
HCT: 21.1 % — ABNORMAL LOW (ref 39.0–52.0)
Hemoglobin: 6.7 g/dL — CL (ref 13.0–17.0)
MCH: 30.5 pg (ref 26.0–34.0)
MCHC: 31.8 g/dL (ref 30.0–36.0)
MCV: 95.9 fL (ref 80.0–100.0)
Platelets: 303 10*3/uL (ref 150–400)
RBC: 2.2 MIL/uL — ABNORMAL LOW (ref 4.22–5.81)
RDW: 13.9 % (ref 11.5–15.5)
WBC: 7.9 10*3/uL (ref 4.0–10.5)
nRBC: 0.8 % — ABNORMAL HIGH (ref 0.0–0.2)

## 2020-06-05 LAB — SARS CORONAVIRUS 2 (TAT 6-24 HRS): SARS Coronavirus 2: NEGATIVE

## 2020-06-05 LAB — HEMOGLOBIN AND HEMATOCRIT, BLOOD
HCT: 28.1 % — ABNORMAL LOW (ref 39.0–52.0)
Hemoglobin: 8.9 g/dL — ABNORMAL LOW (ref 13.0–17.0)

## 2020-06-05 LAB — PREPARE RBC (CROSSMATCH)

## 2020-06-05 MED ORDER — SODIUM CHLORIDE 0.9% IV SOLUTION: Freq: Once | INTRAVENOUS | Status: AC

## 2020-06-05 MED ORDER — FERROUS SULFATE 325 (65 FE) MG PO TABS
325.0000 mg | ORAL_TABLET | Freq: Three times a day (TID) | ORAL | Status: DC
  Administered 2020-06-05 – 2020-06-06 (×2): 325 mg via ORAL
  Filled 2020-06-05 (×2): qty 1

## 2020-06-05 NOTE — Progress Notes (Signed)
Passage 5N14 critical lab value AM Hemoglobin of 6.7. HGB 6.7-- Aggie Moats PA notified via [email or web address] page message for [place] [facility]

## 2020-06-05 NOTE — Progress Notes (Signed)
Orthopaedic Trauma Progress Note  SUBJECTIVE: Continues to have issues with pain and spasticity in lower extremities. Hemoglobin has continued to drop since surgery, but patient is asymptomatic. Denies and light-headedness, dizziness, SOB, chest pain. Not eating or drinking much, needs assistance with meals. Patient has bed offer at [facility] rehab once medically stable. Received covid booster yesterday, tolerated this well. Repeat covid test negative    OBJECTIVE:  Vitals:   06/04/20 2051 06/05/20 0858  BP: (!) 94/58 (!) 98/56  Pulse: 80 74  Resp: 16 16  Temp: 97.6 F (36.4 C) 97.8 F (36.6 C)  SpO2: 96% 95%    General: Resting in bed, NAD Respiratory: No increased work of breathing.  Left Lower Extremity: Dressing over posterior lateral hip CDI. Leg in flexed position. No visible muscle spasms currently. Swelling through the proximal thigh up into the hip, this is expected following procedure and current placement of leg. No significant tenderness over hip but does have some discomfort with palpation through the thigh. Endorses sensation to ligth touch over dorsal and plantar aspect of foot. +DP pulse  IMAGING: Stable post op imaging.   LABS:  Results for orders placed or performed during the hospital encounter of 05/30/20 (from the past 24 hour(s))  SARS CORONAVIRUS 2 (TAT 6-24 HRS) Nasopharyngeal Nasopharyngeal Swab     Status: None   Collection Time: 06/04/20 10:30 PM   Specimen: Nasopharyngeal Swab  Result Value Ref Range   SARS Coronavirus 2 NEGATIVE NEGATIVE  CBC     Status: Abnormal   Collection Time: 06/05/20  4:25 AM  Result Value Ref Range   WBC 7.9 4.0 - 10.5 K/uL   RBC 2.20 (L) 4.22 - 5.81 MIL/uL   Hemoglobin 6.7 (LL) 13.0 - 17.0 g/dL   HCT 21.1 (L) 39.0 - 52.0 %   MCV 95.9 80.0 - 100.0 fL   MCH 30.5 26.0 - 34.0 pg   MCHC 31.8 30.0 - 36.0 g/dL   RDW 13.9 11.5 - 15.5 %   Platelets 303 150 - 400 K/uL   nRBC 0.8 (H) 0.0 - 0.2 %    ASSESSMENT: Herbert Williams is a 55  y.o. male, 6 Days Post-Op s/p GIRDLESTONE ARTHROPLASTY LEFT HIP  CV/Blood loss: Acute blood loss anemia, Hgb 6.7 this morning. BP soft  PLAN: Weightbearing: patient bedridden at baseline. May attempt to WBAT LLE for transfers Incisional and dressing care: Change PRN Showering: Ok to shower with assistance Orthopedic device(s):  None Pain management:  1. Tylenol 1000 mg q 8 hours scheduled 2. Robaxin 1000 mg q 6 hours scheduled 3. Oxycodone 10-20 mg q 4 hours PRN 4. Dilaudid 0.5-1 mg q 4 hours PRN 5. Morphine 2 mg q 2 hours PRN VTE prophylaxis: Lovenox, SCDs ID:  Ancef 2gm post op completed Foley/Lines:  No foley, KVO IVFs Dispo: Transfuse 1 unit PRBCs now. Will recheck H&H post-transfusion. Not stable for d/c today. Continue therapies as tolerated. PT/OT recommending SNF.  Has bed offer at [facility] once medically stable  Follow - up plan: TBD  Contact information:  Katha Hamming MD, Patrecia Pace PA-C. After hours and holidays please check [email or web address] for group call information for [facility]   Herbert Pennie A. Ricci Barker, PA-C [phone] (office) [email or web address]

## 2020-06-05 NOTE — Care Management Important Message (Signed)
Important Message  Patient Details  Name: Herbert Williams MRN: [id number] Date of Birth: May 21, 1965   Medicare Important Message Given:  Yes     Daziyah Cogan P [place] 06/05/2020, 2:23 PM

## 2020-06-05 NOTE — Progress Notes (Signed)
HGB 6.7. Got call from PA, will transfuse a unit of blood today.

## 2020-06-05 NOTE — TOC Progression Note (Signed)
Transition of Care [facility]) - Progression Note    Patient Details  Name: Herbert Williams MRN: [id number] Date of Birth: [birth year]  Transition of Care [facility]) CM/SW Contact  Sharin Mons, RN Phone Number: 06/05/2020, 12:46 PM  Clinical Narrative:    NCM made aware by [facility] admissions insurance authorization received for SNF placement. Awaiting medical readiness...receiving blood transfusion today. TOC team will continue to monitor and assist with TOC needs.   Expected Discharge Plan: [place] [facility] SN[facility]) Barriers to Discharge: Continued Medical Work up  Expected Discharge Plan and Services Expected Discharge Plan: [place] [facility] SNF)   Discharge Planning Services: CM Consult   Social Determinants of Health (SDOH) Interventions    Readmission Risk Interventions No flowsheet data found.

## 2020-06-06 DIAGNOSIS — Z961 Presence of intraocular lens: Secondary | ICD-10-CM | POA: Diagnosis not present

## 2020-06-06 DIAGNOSIS — R200 Anesthesia of skin: Secondary | ICD-10-CM | POA: Diagnosis not present

## 2020-06-06 DIAGNOSIS — R109 Unspecified abdominal pain: Secondary | ICD-10-CM | POA: Diagnosis not present

## 2020-06-06 DIAGNOSIS — I10 Essential (primary) hypertension: Secondary | ICD-10-CM | POA: Diagnosis not present

## 2020-06-06 DIAGNOSIS — Z87898 Personal history of other specified conditions: Secondary | ICD-10-CM | POA: Diagnosis not present

## 2020-06-06 DIAGNOSIS — Z743 Need for continuous supervision: Secondary | ICD-10-CM | POA: Diagnosis not present

## 2020-06-06 DIAGNOSIS — D649 Anemia, unspecified: Secondary | ICD-10-CM | POA: Diagnosis not present

## 2020-06-06 DIAGNOSIS — N3946 Mixed incontinence: Secondary | ICD-10-CM | POA: Diagnosis not present

## 2020-06-06 DIAGNOSIS — Z1283 Encounter for screening for malignant neoplasm of skin: Secondary | ICD-10-CM | POA: Diagnosis not present

## 2020-06-06 DIAGNOSIS — R262 Difficulty in walking, not elsewhere classified: Secondary | ICD-10-CM | POA: Diagnosis not present

## 2020-06-06 DIAGNOSIS — D75839 Thrombocytosis, unspecified: Secondary | ICD-10-CM | POA: Diagnosis not present

## 2020-06-06 DIAGNOSIS — R279 Unspecified lack of coordination: Secondary | ICD-10-CM | POA: Diagnosis not present

## 2020-06-06 DIAGNOSIS — R5381 Other malaise: Secondary | ICD-10-CM | POA: Diagnosis not present

## 2020-06-06 DIAGNOSIS — G894 Chronic pain syndrome: Secondary | ICD-10-CM | POA: Diagnosis not present

## 2020-06-06 DIAGNOSIS — S73002D Unspecified subluxation of left hip, subsequent encounter: Secondary | ICD-10-CM | POA: Diagnosis not present

## 2020-06-06 DIAGNOSIS — Z471 Aftercare following joint replacement surgery: Secondary | ICD-10-CM | POA: Diagnosis not present

## 2020-06-06 DIAGNOSIS — R221 Localized swelling, mass and lump, neck: Secondary | ICD-10-CM | POA: Diagnosis not present

## 2020-06-06 DIAGNOSIS — R339 Retention of urine, unspecified: Secondary | ICD-10-CM | POA: Diagnosis not present

## 2020-06-06 DIAGNOSIS — R3915 Urgency of urination: Secondary | ICD-10-CM | POA: Diagnosis not present

## 2020-06-06 DIAGNOSIS — K219 Gastro-esophageal reflux disease without esophagitis: Secondary | ICD-10-CM | POA: Diagnosis not present

## 2020-06-06 DIAGNOSIS — R031 Nonspecific low blood-pressure reading: Secondary | ICD-10-CM | POA: Diagnosis not present

## 2020-06-06 DIAGNOSIS — G8929 Other chronic pain: Secondary | ICD-10-CM | POA: Diagnosis not present

## 2020-06-06 DIAGNOSIS — K5900 Constipation, unspecified: Secondary | ICD-10-CM | POA: Diagnosis not present

## 2020-06-06 DIAGNOSIS — Z96642 Presence of left artificial hip joint: Secondary | ICD-10-CM | POA: Diagnosis not present

## 2020-06-06 DIAGNOSIS — R300 Dysuria: Secondary | ICD-10-CM | POA: Diagnosis not present

## 2020-06-06 DIAGNOSIS — M6281 Muscle weakness (generalized): Secondary | ICD-10-CM | POA: Diagnosis not present

## 2020-06-06 DIAGNOSIS — Z982 Presence of cerebrospinal fluid drainage device: Secondary | ICD-10-CM | POA: Diagnosis not present

## 2020-06-06 DIAGNOSIS — H6123 Impacted cerumen, bilateral: Secondary | ICD-10-CM | POA: Diagnosis not present

## 2020-06-06 DIAGNOSIS — L22 Diaper dermatitis: Secondary | ICD-10-CM | POA: Diagnosis not present

## 2020-06-06 DIAGNOSIS — R1084 Generalized abdominal pain: Secondary | ICD-10-CM | POA: Diagnosis not present

## 2020-06-06 DIAGNOSIS — S32421D Displaced fracture of posterior wall of right acetabulum, subsequent encounter for fracture with routine healing: Secondary | ICD-10-CM | POA: Diagnosis not present

## 2020-06-06 DIAGNOSIS — M62838 Other muscle spasm: Secondary | ICD-10-CM | POA: Diagnosis not present

## 2020-06-06 DIAGNOSIS — W19XXXA Unspecified fall, initial encounter: Secondary | ICD-10-CM | POA: Diagnosis not present

## 2020-06-06 DIAGNOSIS — L89223 Pressure ulcer of left hip, stage 3: Secondary | ICD-10-CM | POA: Diagnosis not present

## 2020-06-06 DIAGNOSIS — M25552 Pain in left hip: Secondary | ICD-10-CM | POA: Diagnosis not present

## 2020-06-06 DIAGNOSIS — G910 Communicating hydrocephalus: Secondary | ICD-10-CM | POA: Diagnosis not present

## 2020-06-06 LAB — CBC
HCT: 24.7 % — ABNORMAL LOW (ref 39.0–52.0)
HCT: 26.8 % — ABNORMAL LOW (ref 39.0–52.0)
Hemoglobin: 7.9 g/dL — ABNORMAL LOW (ref 13.0–17.0)
Hemoglobin: 8.4 g/dL — ABNORMAL LOW (ref 13.0–17.0)
MCH: 29.8 pg (ref 26.0–34.0)
MCH: 29.7 pg (ref 26.0–34.0)
MCHC: 32 g/dL (ref 30.0–36.0)
MCHC: 31.3 g/dL (ref 30.0–36.0)
MCV: 93.2 fL (ref 80.0–100.0)
MCV: 94.7 fL (ref 80.0–100.0)
Platelets: 338 10*3/uL (ref 150–400)
Platelets: 338 10*3/uL (ref 150–400)
RBC: 2.65 MIL/uL — ABNORMAL LOW (ref 4.22–5.81)
RBC: 2.83 MIL/uL — ABNORMAL LOW (ref 4.22–5.81)
RDW: 14.8 % (ref 11.5–15.5)
RDW: 15.1 % (ref 11.5–15.5)
WBC: 7.7 10*3/uL (ref 4.0–10.5)
WBC: 7.8 10*3/uL (ref 4.0–10.5)
nRBC: 1 % — ABNORMAL HIGH (ref 0.0–0.2)
nRBC: 0.5 % — ABNORMAL HIGH (ref 0.0–0.2)

## 2020-06-06 LAB — TYPE AND SCREEN
ABO/RH(D): A POS
Antibody Screen: NEGATIVE
Unit division: 0

## 2020-06-06 LAB — BPAM RBC
Blood Product Expiration Date: 202205042359
ISSUE DATE / TIME: 202204281317
Unit Type and Rh: 6200

## 2020-06-06 MED ORDER — ONDANSETRON HCL 4 MG PO TABS: 4.0000 mg | ORAL_TABLET | Freq: Four times a day (QID) | ORAL | 0 refills | Status: DC | PRN

## 2020-06-06 MED ORDER — OXYCODONE-ACETAMINOPHEN 10-325 MG PO TABS: 1.0000 | ORAL_TABLET | ORAL | 0 refills | Status: DC | PRN

## 2020-06-06 MED ORDER — FERROUS SULFATE 325 (65 FE) MG PO TABS: 325.0000 mg | ORAL_TABLET | Freq: Three times a day (TID) | ORAL | 0 refills | Status: AC

## 2020-06-06 MED ORDER — DOCUSATE SODIUM 100 MG PO CAPS: 100.0000 mg | ORAL_CAPSULE | Freq: Two times a day (BID) | ORAL | 0 refills | Status: DC

## 2020-06-06 MED ORDER — METHOCARBAMOL 500 MG PO TABS: 1000.0000 mg | ORAL_TABLET | Freq: Four times a day (QID) | ORAL | 0 refills | Status: AC

## 2020-06-06 MED ORDER — ENOXAPARIN SODIUM 40 MG/0.4ML IJ SOSY: 40.0000 mg | PREFILLED_SYRINGE | INTRAMUSCULAR | 0 refills | Status: DC

## 2020-06-06 NOTE — Progress Notes (Signed)
PT Cancellation Note  Patient Details Name: Herbert Williams MRN: [id number] DOB: 31-May-1965   Cancelled Treatment:    Reason Eval/Treat Not Completed: Other (comment) (pt asleep unable to arouse). Attempted to arouse pt remained asleep. Will attempt in PM time permitting  Lyanne Co, DPT Acute Rehabilitation Services [phone]   [place] 06/06/2020, 9:16 AM

## 2020-06-06 NOTE — Progress Notes (Signed)
Transport at bedside to take patient to facility. Patient awake,alert/orientedx4 and VSS. Patient belongings given to transport.

## 2020-06-06 NOTE — Discharge Summary (Signed)
Orthopaedic Trauma Service (OTS) Discharge Summary   Patient ID: Herbert Williams MRN: [id number] DOB/AGE: September 10, 1965 55 y.o.  Admit date: 05/30/2020 Discharge date: 06/06/2020  Admission Diagnoses: 1. Left hip subluxation/dislocation  2. Chronic left hip pain   Discharge Diagnoses:  Principal Problem:   Subluxation of left hip (HCC) Active Problems:   Chronic left hip pain   Past Medical History:  Diagnosis Date  . Anxiety    claustrophobic  . Brain tumor ([place]) 1979   no current problems, no deficits  . 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 GERD (gastroesophageal reflux disease) 09/10/2013  . HTN (hypertension) 09/10/2013  . Hyperlipemia 09/10/2013     Procedures Performed: 1. CPT 27122-Left hip resection arthroplasty 2. CPT 20680-Removal of left hip prosthesis  Discharged Condition: stable  Hospital Course: Patient presented to [facility] from home on 05/30/2020 for scheduled procedure on left hip.  Was taken to the operating room by Dr. Doreatha Martin for the above procedure and tolerated this well.  Patient was admitted for pain control and therapies.  Was allowed to be weightbearing as tolerated on the left lower extremity postoperatively.  Began working with physical and Occupational Therapy on postoperative day #1 but made minimal progress due to pain and baseline spasticity of lower extremities.  Was started on Lovenox for DVT prophylaxis starting on postoperative day #1.  On 06/05/2020, patient's hemoglobin dropped to 6.7 which required transfusion of 1 unit PRBCs.  Patient tolerated transfusion well and hemoglobin responded appropriately. On 06/06/2020, the patient was tolerating diet, working well with therapies, pain well controlled, vital signs stable, dressings clean, dry, intact and felt stable for discharge to SNF. Patient will follow up as below and knows to call with  questions or concerns.     Consults: None  Significant Diagnostic Studies:   Results for orders placed or performed during the hospital encounter of 05/30/20 (from the past 168 hour(s))  VITAMIN D 25 Hydroxy (Vit-D Deficiency, Fractures)   Collection Time: 05/31/20  2:25 AM  Result Value Ref Range   Vit D, 25-Hydroxy 59.75 30 - 100 ng/mL  Basic metabolic panel   Collection Time: 05/31/20  2:25 AM  Result Value Ref Range   Sodium 138 135 - 145 mmol/L   Potassium 4.4 3.5 - 5.1 mmol/L   Chloride 99 98 - 111 mmol/L   CO2 31 22 - 32 mmol/L   Glucose, Bld 110 (H) 70 - 99 mg/dL   BUN 10 6 - 20 mg/dL   Creatinine, Ser 0.77 0.61 - 1.24 mg/dL   Calcium 9.4 8.9 - 10.3 mg/dL   GFR, Estimated >60 >60 mL/min   Anion gap 8 5 - 15  CBC   Collection Time: 05/31/20  2:25 AM  Result Value Ref Range   WBC 11.0 (H) 4.0 - 10.5 K/uL   RBC 3.55 (L) 4.22 - 5.81 MIL/uL   Hemoglobin 10.5 (L) 13.0 - 17.0 g/dL   HCT 33.8 (L) 39.0 - 52.0 %   MCV 95.2 80.0 - 100.0 fL   MCH 29.6 26.0 - 34.0 pg   MCHC 31.1 30.0 - 36.0 g/dL   RDW 13.3 11.5 - 15.5 %   Platelets 264 150 - 400 K/uL   nRBC 0.0 0.0 - 0.2 %  Basic metabolic panel   Collection Time: 06/01/20  2:53 AM  Result Value Ref Range   Sodium 136 135 - 145 mmol/L  Potassium 3.8 3.5 - 5.1 mmol/L   Chloride 100 98 - 111 mmol/L   CO2 28 22 - 32 mmol/L   Glucose, Bld 106 (H) 70 - 99 mg/dL   BUN 10 6 - 20 mg/dL   Creatinine, Ser 0.63 0.61 - 1.24 mg/dL   Calcium 8.9 8.9 - 10.3 mg/dL   GFR, Estimated >60 >60 mL/min   Anion gap 8 5 - 15  CBC   Collection Time: 06/01/20  2:53 AM  Result Value Ref Range   WBC 9.2 4.0 - 10.5 K/uL   RBC 3.13 (L) 4.22 - 5.81 MIL/uL   Hemoglobin 9.4 (L) 13.0 - 17.0 g/dL   HCT 29.7 (L) 39.0 - 52.0 %   MCV 94.9 80.0 - 100.0 fL   MCH 30.0 26.0 - 34.0 pg   MCHC 31.6 30.0 - 36.0 g/dL   RDW 13.3 11.5 - 15.5 %   Platelets 234 150 - 400 K/uL   nRBC 0.0 0.0 - 0.2 %  Basic metabolic panel   Collection Time: 06/02/20  6:07 AM   Result Value Ref Range   Sodium 137 135 - 145 mmol/L   Potassium 4.0 3.5 - 5.1 mmol/L   Chloride 101 98 - 111 mmol/L   CO2 27 22 - 32 mmol/L   Glucose, Bld 97 70 - 99 mg/dL   BUN 6 6 - 20 mg/dL   Creatinine, Ser 0.60 (L) 0.61 - 1.24 mg/dL   Calcium 8.7 (L) 8.9 - 10.3 mg/dL   GFR, Estimated >60 >60 mL/min   Anion gap 9 5 - 15  CBC   Collection Time: 06/03/20  9:07 AM  Result Value Ref Range   WBC 8.2 4.0 - 10.5 K/uL   RBC 2.35 (L) 4.22 - 5.81 MIL/uL   Hemoglobin 7.0 (L) 13.0 - 17.0 g/dL   HCT 22.5 (L) 39.0 - 52.0 %   MCV 95.7 80.0 - 100.0 fL   MCH 29.8 26.0 - 34.0 pg   MCHC 31.1 30.0 - 36.0 g/dL   RDW 13.5 11.5 - 15.5 %   Platelets 247 150 - 400 K/uL   nRBC 0.4 (H) 0.0 - 0.2 %  CBC   Collection Time: 06/04/20  8:59 AM  Result Value Ref Range   WBC 8.2 4.0 - 10.5 K/uL   RBC 2.29 (L) 4.22 - 5.81 MIL/uL   Hemoglobin 7.0 (L) 13.0 - 17.0 g/dL   HCT 22.2 (L) 39.0 - 52.0 %   MCV 96.9 80.0 - 100.0 fL   MCH 30.6 26.0 - 34.0 pg   MCHC 31.5 30.0 - 36.0 g/dL   RDW 13.5 11.5 - 15.5 %   Platelets 296 150 - 400 K/uL   nRBC 0.4 (H) 0.0 - 0.2 %  SARS CORONAVIRUS 2 (TAT 6-24 HRS) Nasopharyngeal Nasopharyngeal Swab   Collection Time: 06/04/20 10:30 PM   Specimen: Nasopharyngeal Swab  Result Value Ref Range   SARS Coronavirus 2 NEGATIVE NEGATIVE  CBC   Collection Time: 06/05/20  4:25 AM  Result Value Ref Range   WBC 7.9 4.0 - 10.5 K/uL   RBC 2.20 (L) 4.22 - 5.81 MIL/uL   Hemoglobin 6.7 (LL) 13.0 - 17.0 g/dL   HCT 21.1 (L) 39.0 - 52.0 %   MCV 95.9 80.0 - 100.0 fL   MCH 30.5 26.0 - 34.0 pg   MCHC 31.8 30.0 - 36.0 g/dL   RDW 13.9 11.5 - 15.5 %   Platelets 303 150 - 400 K/uL   nRBC 0.8 (H) 0.0 -  0.2 %  Basic metabolic panel   Collection Time: 06/05/20 11:44 AM  Result Value Ref Range   Sodium 137 135 - 145 mmol/L   Potassium 3.7 3.5 - 5.1 mmol/L   Chloride 101 98 - 111 mmol/L   CO2 28 22 - 32 mmol/L   Glucose, Bld 89 70 - 99 mg/dL   BUN 5 (L) 6 - 20 mg/dL   Creatinine, Ser  0.55 (L) 0.61 - 1.24 mg/dL   Calcium 8.5 (L) 8.9 - 10.3 mg/dL   GFR, Estimated >60 >60 mL/min   Anion gap 8 5 - 15  Prepare RBC (crossmatch)   Collection Time: 06/05/20 11:44 AM  Result Value Ref Range   Order Confirmation      ORDER PROCESSED BY BLOOD BANK Performed at [facility], [address], [place], [place] [address]   Type and screen [place]   Collection Time: 06/05/20 11:44 AM  Result Value Ref Range   ABO/RH(D) A POS    Antibody Screen NEG    Sample Expiration 06/08/2020,2359    Unit Number [id number]    Blood Component Type RBC LR PHER1    Unit division 00    Status of Unit ISSUED,FINAL    Transfusion Status OK TO TRANSFUSE    Crossmatch Result      Compatible Performed at [facility], [place] [address], [place], [place] [address]   BPAM [facility]   Collection Time: 06/05/20 11:44 AM  Result Value Ref Range   ISSUE DATE / TIME [id number]    Blood Product Unit Number [id number]    PRODUCT CODE [id number]    Unit Type and Rh 6200    Blood Product Expiration Date 681275170017   Hemoglobin and hematocrit, blood   Collection Time: 06/05/20  9:21 PM  Result Value Ref Range   Hemoglobin 8.9 (L) 13.0 - 17.0 g/dL   HCT 28.1 (L) 39.0 - 52.0 %  CBC   Collection Time: 06/06/20 12:15 AM  Result Value Ref Range   WBC 7.7 4.0 - 10.5 K/uL   RBC 2.65 (L) 4.22 - 5.81 MIL/uL   Hemoglobin 7.9 (L) 13.0 - 17.0 g/dL   HCT 24.7 (L) 39.0 - 52.0 %   MCV 93.2 80.0 - 100.0 fL   MCH 29.8 26.0 - 34.0 pg   MCHC 32.0 30.0 - 36.0 g/dL   RDW 14.8 11.5 - 15.5 %   Platelets 338 150 - 400 K/uL   nRBC 1.0 (H) 0.0 - 0.2 %     Treatments: IV hydration, antibiotics: Ancef, analgesia: acetaminophen, Dilaudid, Morphine and oxycodone, anticoagulation: LMW heparin, therapies: PT and OT and surgery: as above  Discharge Exam: General: Resting in bed, NAD Respiratory: No increased work of breathing.  Left Lower Extremity: Dressing over posterior lateral hip  CDI. Leg in flexed position. No visible muscle spasms currently. Swelling through the proximal thigh up into the hip, this is expected following procedure and current placement of leg. No significant tenderness over hip but does have some discomfort with palpation through the thigh. Endorses sensation to ligth touch over dorsal and plantar aspect of foot. +DP pulse  Disposition:    Allergies as of 06/06/2020      Reactions   Pedi-pre Tape Spray [wound Dressing Adhesive] Rash   Penicillins Rash      Medication List    STOP taking these medications   acetaminophen 650 MG CR tablet Commonly known as: TYLENOL     TAKE these medications  aspirin 81 MG tablet Take 1 tablet (81 mg total) by mouth daily.   atenolol 50 MG tablet Commonly known as: TENORMIN Take 50 mg by mouth daily.   calcium carbonate 500 MG chewable tablet Commonly known as: TUMS - dosed in mg elemental calcium Chew 500 mg by mouth 3 (three) times daily as needed for indigestion or heartburn.   carboxymethylcellulose 0.5 % Soln Commonly known as: REFRESH PLUS 2 drops 4 (four) times daily as needed (dry eyes.).   docusate sodium 100 MG capsule Commonly known as: COLACE Take 1 capsule (100 mg total) by mouth 2 (two) times daily.   enoxaparin 40 MG/0.4ML injection Commonly known as: LOVENOX Inject 0.4 mLs (40 mg total) into the skin daily for 21 days.   fenofibrate 160 MG tablet Take 160 mg by mouth daily.   ferrous sulfate 325 (65 FE) MG tablet Take 1 tablet (325 mg total) by mouth 3 (three) times daily with meals.   Fish Oil 1000 MG Caps Take 1,000 mg by mouth daily.   fluticasone 50 MCG/ACT nasal spray Commonly known as: FLONASE Place 1-2 sprays into both nostrils daily as needed for allergies or rhinitis.   ibuprofen 200 MG tablet Commonly known as: ADVIL Take 800 mg by mouth every 8 (eight) hours as needed for moderate pain.   methocarbamol 500 MG tablet Commonly known as: ROBAXIN Take 2  tablets (1,000 mg total) by mouth every 6 (six) hours. What changed:   how much to take  when to take this  reasons to take this   omeprazole 20 MG capsule Commonly known as: PRILOSEC Take 20 mg by mouth daily.   ondansetron 4 MG tablet Commonly known as: ZOFRAN Take 1 tablet (4 mg total) by mouth every 6 (six) hours as needed for nausea.   OVER THE COUNTER MEDICATION Eye drops for dye eyes prn   oxyCODONE-acetaminophen 10-325 MG tablet Commonly known as: Percocet Take 1 tablet by mouth every 4 (four) hours as needed for pain.   traMADol 50 MG tablet Commonly known as: ULTRAM Take 50 mg by mouth every 6 (six) hours as needed for pain.   vitamin B-12 1000 MCG tablet Commonly known as: CYANOCOBALAMIN Take 1,000 mcg by mouth daily.   Vitamin D (Ergocalciferol) 1.25 MG (50000 UNIT) Caps capsule Commonly known as: DRISDOL Take 50,000 Units by mouth once a week.       Follow-up Information    Haddix, Thomasene Lot, MD. Schedule an appointment as soon as possible for a visit in 2 week(s).   Specialty: Orthopedic Surgery Why: for wound check and repeat x-rays  Contact information: [address] [address] [phone]               Discharge Instructions and Plan: Patient will be discharged to [facility]. Will be discharged on Lovenox for DVT prophylaxis. Please recheck CBC on Sunday 06/08/2020. Patient will follow up with Dr. Doreatha Martin in 2 weeks for repeat x-rays and suture removal.   Signed:  Leary Roca. Carmie Kanner ?[phone]? (phone) 06/06/2020, 1:28 PM  [facility] [place] [id number] [phone] (F)

## 2020-06-06 NOTE — TOC Transition Note (Signed)
Transition of Care [facility]) - CM/SW Discharge Note   Patient Details  Name: Herbert Williams MRN: [id number] Date of Birth: 10-24-1965  [facility] CM/SW Contact:  Sharin Mons, RN Phone Number: [phone] 06/06/2020, 2:10 PM   Clinical Narrative:    Patient will DC to: [facility] Anticipated DC date: 06/06/2020 Family notified: yes Transport by: Corey Harold  Per MD patient ready for DC today. RN, patient, patient's family, and facility notified of DC. Discharge Summary and FL2 sent to facility. RN to call report prior to discharge [phone]). DC packet on chart. Ambulance transport requested for patient.   RNCM will sign off for now as intervention is no longer needed. Please consult [place] again if new needs arise.    Final next level of care: Skilled Nursing Facility Barriers to Discharge: No Barriers Identified   Patient Goals and CMS Choice Patient states their goals for this hospitalization and ongoing recovery are:: TO GET BETTER      Discharge Placement     Discharge Plan and Services   Discharge Planning Services: CM Consult   Social Determinants of Health (SDOH) Interventions     Readmission Risk Interventions No flowsheet data found.

## 2020-06-06 NOTE — Progress Notes (Signed)
Bedside shift report completed. Received patient awake,alert/orientedx4 and able to verbalize needs. NAD noted; respirations easy on room air. Family at bedside. Per report patient to be d/c but awaiting transportation at this time. Movement/sensation noted to all extremities. Condom cath on. Whiteboard updated. All safety measures in place and personal belongings within reach. Awaiting transport at this time.

## 2020-06-06 NOTE — Progress Notes (Signed)
Occupational Therapy Treatment Patient Details Name: Herbert Williams MRN: [id number] DOB: 30-May-1965 Today's Date: 06/06/2020    History of present illness Pt is a 55 y.o. male presents for left hip resection arthroplasty for chronic left hip pain.  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  He had a femoral neck fracture that was treated with a hemiarthroplasty multiple years ago.  He recently had a fall in January of this year where he injured his hip.  Since that time he has been having progressive worsening hip pain and inability to move it.   OT comments  Pt eager to work with therapy today. Pt required bed mobility for cleaning of peri area and changing of sheets/pads. Significant pain when rolling onto L side. Pt did attempt sit<>stand at EOB with max A +2- able to clear bottom from mattress on 2nd attempt. Pt continues to be highly motivated but limited by pain. OT will continue to follow acutely POC remains appropriate.    Follow Up Recommendations  SNF;Supervision/Assistance - 24 hour    Equipment Recommendations  Hospital bed    Recommendations for Other Services      Precautions / Restrictions Precautions Precautions: Other (comment) Precaution Comments: significant spasticity in all extremities, LE > UE Restrictions Weight Bearing Restrictions: Yes LLE Weight Bearing: Weight bearing as tolerated       Mobility Bed Mobility Overal bed mobility: Needs Assistance Bed Mobility: Rolling;Sidelying to Sit;Sit to Sidelying Rolling: Mod assist;+2 for physical assistance Sidelying to sit: +2 for physical assistance;Mod assist   Sit to supine: Mod assist;+2 for safety/equipment Sit to sidelying: Max assist;+2 for physical assistance General bed mobility comments: Rolling R mod A. max +2 rolling L for hygiene. Mod of 2 for safety due to increase tone for sidelying>sit    Transfers Overall transfer level: Needs  assistance Equipment used: 2 person hand held assist Transfers: Sit to/from Stand Sit to Stand: Max assist;+2 physical assistance        Lateral/Scoot Transfers: Max assist;+2 physical assistance General transfer comment: performed x 2 trials wtih assist of 2. Improved use of R LE pressing into standing second trial. Pt with increase in pain during transfers    Balance Overall balance assessment: Needs assistance Sitting-balance support: Bilateral upper extremity supported;Feet supported Sitting balance-Leahy Scale: Poor                                     ADL either performed or assessed with clinical judgement   ADL Overall ADL's : Needs assistance/impaired     Grooming: Wash/dry face;Modified independent;Bed level       Lower Body Bathing: Maximal assistance;+2 for physical assistance;Bed level           Toilet Transfer: Maximal assistance;+2 for physical assistance;+2 for safety/equipment   Toileting- Clothing Manipulation and Hygiene: Maximal assistance;+2 for physical assistance;+2 for safety/equipment;Bed level Toileting - Clothing Manipulation Details (indicate cue type and reason): Pt able to complete frontal perineal hygiene     Functional mobility during ADLs: Maximal assistance;+2 for physical assistance;+2 for safety/equipment (attempt to stand x2)       Vision       Perception     Praxis      Cognition Arousal/Alertness: Awake/alert Behavior During Therapy: WFL for tasks assessed/performed Overall Cognitive Status: Within Functional Limits for tasks assessed  Exercises     Shoulder Instructions       General Comments      Pertinent Vitals/ Pain       Pain Assessment: 0-10 Pain Score: 9  Pain Location: L hip Pain Descriptors / Indicators: Discomfort;Grimacing;Guarding;Contraction;Spasm Pain Intervention(s): Limited activity within patient's tolerance;Monitored  during session;Repositioned  Home Living                                          Prior Functioning/Environment              Frequency  Min 2X/week        Progress Toward Goals  OT Goals(current goals can now be found in the care plan section)  Progress towards OT goals: Progressing toward goals  Acute Rehab OT Goals Patient Stated Goal: be able to transfer OT Goal Formulation: With patient Time For Goal Achievement: 06/14/20 Potential to Achieve Goals: [place] Discharge plan remains appropriate;Frequency remains appropriate    Co-evaluation    PT/OT/SLP Co-Evaluation/Treatment: Yes Reason for Co-Treatment: Complexity of the patient's impairments (multi-system involvement);For patient/therapist safety;To address functional/ADL transfers PT goals addressed during session: Mobility/safety with mobility;Balance;Strengthening/ROM OT goals addressed during session: ADL's and self-care;Strengthening/ROM      AM-PAC OT "6 Clicks" Daily Activity     Outcome Measure   Help from another person eating meals?: A Little Help from another person taking care of personal grooming?: A Lot Help from another person toileting, which includes using toliet, bedpan, or urinal?: A Lot Help from another person bathing (including washing, rinsing, drying)?: A Lot Help from another person to put on and taking off regular upper body clothing?: A Lot Help from another person to put on and taking off regular lower body clothing?: Total 6 Click Score: 12    End of Session Equipment Utilized During Treatment: Gait belt  OT Visit Diagnosis: Unsteadiness on feet (R26.81);Pain;Other abnormalities of gait and mobility (R26.89) Pain - Right/Left: Left Pain - part of body: Hip   Activity Tolerance Patient limited by pain   Patient Left in bed;with call bell/phone within reach;with nursing/sitter in room;with bed alarm set;Other (comment)   Nurse Communication Mobility  status;Patient requests pain meds;Other (comment) (condom cath needs)        Time: [phone] OT Time Calculation (min): 30 min  Charges: OT General Charges $OT Visit: 1 Visit OT Treatments $Self Care/Home Management : 8-22 mins  Jesse Sans OTR/L Acute Rehabilitation Services Pager: [phone] Office: [place] 06/06/2020, 1:14 PM

## 2020-06-06 NOTE — Discharge Instructions (Signed)
Orthopaedic Trauma Service Discharge Instructions   General Discharge Instructions  WEIGHT BEARING STATUS: Weight-bear as tolerated on left lower extremity  RANGE OF MOTION/ACTIVITY: Okay for range of motion of hip and knee as tolerated  Wound Care: Dressing from left hip may be removed and incisions can be left open to air if there is no drainage. If incision continues to have drainage, follow wound care instructions below. Okay to shower if no drainage from incisions.  DVT/PE prophylaxis: Lovenox  Diet: as you were eating previously.  Can use over the counter stool softeners and bowel preparations, such as Miralax, to help with bowel movements.  Narcotics can be constipating.  Be sure to drink plenty of fluids  PAIN MEDICATION USE AND EXPECTATIONS  You have likely been given narcotic medications to help control your pain.  After a traumatic event that results in an fracture (broken bone) with or without surgery, it is ok to use narcotic pain medications to help control one's pain.  We understand that everyone responds to pain differently and each individual patient will be evaluated on a regular basis for the continued need for narcotic medications. Ideally, narcotic medication use should last no more than 6-8 weeks (coinciding with fracture healing).   As a patient it is your responsibility as well to monitor narcotic medication use and report the amount and frequency you use these medications when you come to your office visit.   We would also advise that if you are using narcotic medications, you should take a dose prior to therapy to maximize you participation.  IF YOU ARE ON NARCOTIC MEDICATIONS IT IS NOT PERMISSIBLE TO OPERATE A MOTOR VEHICLE (MOTORCYCLE/CAR/TRUCK/MOPED) OR HEAVY MACHINERY DO NOT MIX NARCOTICS WITH OTHER CNS (CENTRAL NERVOUS SYSTEM) DEPRESSANTS SUCH AS ALCOHOL   STOP SMOKING OR USING NICOTINE PRODUCTS!!!!  As discussed nicotine severely impairs your body's  ability to heal surgical and traumatic wounds but also impairs bone healing.  Wounds and bone heal by forming microscopic blood vessels (angiogenesis) and nicotine is a vasoconstrictor (essentially, shrinks blood vessels).  Therefore, if vasoconstriction occurs to these microscopic blood vessels they essentially disappear and are unable to deliver necessary nutrients to the healing tissue.  This is one modifiable factor that you can do to dramatically increase your chances of healing your injury.    (This means no smoking, no nicotine gum, patches, etc)  DO NOT USE NONSTEROIDAL ANTI-INFLAMMATORY DRUGS (NSAID'S)  Using products such as Advil (ibuprofen), Aleve (naproxen), Motrin (ibuprofen) for additional pain control during fracture healing can delay and/or prevent the healing response.  If you would like to take over the counter (OTC) medication, Tylenol (acetaminophen) is ok.  However, some narcotic medications that are given for pain control contain acetaminophen as well. Therefore, you should not exceed more than 4000 mg of tylenol in a day if you do not have liver disease.  Also note that there are may OTC medicines, such as cold medicines and allergy medicines that my contain tylenol as well.  If you have any questions about medications and/or interactions please ask your doctor/PA or your pharmacist.      ICE AND ELEVATE INJURED/OPERATIVE EXTREMITY  Using ice and elevating the injured extremity above your heart can help with swelling and pain control.  Icing in a pulsatile fashion, such as 20 minutes on and 20 minutes off, can be followed.    Do not place ice directly on skin. Make sure there is a barrier between to skin and the ice  pack.    Using frozen items such as frozen peas works well as the conform nicely to the are that needs to be iced.  USE AN ACE WRAP OR TED HOSE FOR SWELLING CONTROL  In addition to icing and elevation, Ace wraps or TED hose are used to help limit and resolve swelling.   It is recommended to use Ace wraps or TED hose until you are informed to stop.    When using Ace Wraps start the wrapping distally (farthest away from the body) and wrap proximally (closer to the body)   Example: If you had surgery on your leg or thing and you do not have a splint on, start the ace wrap at the toes and work your way up to the thigh        If you had surgery on your upper extremity and do not have a splint on, start the ace wrap at your fingers and work your way up to the upper arm   [place]: [phone]   VISIT OUR WEBSITE FOR ADDITIONAL INFORMATION: [email or web address]    Discharge Wound Care Instructions  Do NOT apply any ointments, solutions or lotions to pin sites or surgical wounds.  These prevent needed drainage and even though solutions like hydrogen peroxide kill bacteria, they also damage cells lining the pin sites that help fight infection.  Applying lotions or ointments can keep the wounds moist and can cause them to breakdown and open up as well. This can increase the risk for infection. When in doubt call the office.  If any drainage is noted, use gauze or foam dressing  Once the incision is completely dry and without drainage, it may be left open to air out.  Showering may begin 36-48 hours later.  Cleaning gently with soap and water.

## 2020-06-06 NOTE — Progress Notes (Signed)
Orthopaedic Trauma Progress Note  SUBJECTIVE: Received 1 unit PRBCs yesterday. Hgb responded well to this, up to 8.9 post-transfusion. Back down to 7.9 this morning. Patient remains asymptomatic. Denies and light-headedness, dizziness, SOB, chest pain. Not eating or drinking much, needs assistance with meals. Patient has bed offer at [facility] rehab once medically stable.   OBJECTIVE:  Vitals:   06/06/20 0540 06/06/20 0828  BP: 108/68 90/62  Pulse: 88 79  Resp: 19 15  Temp: 99.1 F (37.3 C) (!) 97.5 F (36.4 C)  SpO2: 98% 95%    General: Resting in bed, NAD Respiratory: No increased work of breathing.  Left Lower Extremity: Dressing over posterior lateral hip CDI. Leg in flexed position. No visible muscle spasms currently. Swelling through the proximal thigh up into the hip, this is expected following procedure and current placement of leg. No significant tenderness over hip but does have some discomfort with palpation through the thigh. Endorses sensation to ligth touch over dorsal and plantar aspect of foot. +DP pulse  IMAGING: Stable post op imaging.   LABS:  Results for orders placed or performed during the hospital encounter of 05/30/20 (from the past 24 hour(s))  Prepare RBC (crossmatch)     Status: None   Collection Time: 06/05/20 11:44 AM  Result Value Ref Range   Order Confirmation      ORDER PROCESSED BY BLOOD BANK Performed at [facility], [address] [place], [place] [address]   Type and screen [place]     Status: None (Preliminary result)   Collection Time: 06/05/20 11:44 AM  Result Value Ref Range   ABO/RH(D) A POS    Antibody Screen NEG    Sample Expiration 06/08/2020,2359    Unit Number [id number]    Blood Component Type RBC LR PHER1    Unit division 00    Status of Unit ISSUED    Transfusion Status OK TO TRANSFUSE    Crossmatch Result      Compatible Performed at [facility], [place] [address] [place],  [address]    Basic metabolic panel     Status: Abnormal   Collection Time: 06/05/20 11:44 AM  Result Value Ref Range   Sodium 137 135 - 145 mmol/L   Potassium 3.7 3.5 - 5.1 mmol/L   Chloride 101 98 - 111 mmol/L   CO2 28 22 - 32 mmol/L   Glucose, Bld 89 70 - 99 mg/dL   BUN 5 (L) 6 - 20 mg/dL   Creatinine, Ser 0.55 (L) 0.61 - 1.24 mg/dL   Calcium 8.5 (L) 8.9 - 10.3 mg/dL   GFR, Estimated >60 >60 mL/min   Anion gap 8 5 - 15  Hemoglobin and hematocrit, blood     Status: Abnormal   Collection Time: 06/05/20  9:21 PM  Result Value Ref Range   Hemoglobin 8.9 (L) 13.0 - 17.0 g/dL   HCT 28.1 (L) 39.0 - 52.0 %  CBC     Status: Abnormal   Collection Time: 06/06/20 12:15 AM  Result Value Ref Range   WBC 7.7 4.0 - 10.5 K/uL   RBC 2.65 (L) 4.22 - 5.81 MIL/uL   Hemoglobin 7.9 (L) 13.0 - 17.0 g/dL   HCT 24.7 (L) 39.0 - 52.0 %   MCV 93.2 80.0 - 100.0 fL   MCH 29.8 26.0 - 34.0 pg   MCHC 32.0 30.0 - 36.0 g/dL   RDW 14.8 11.5 - 15.5 %   Platelets 338 150 - 400  K/uL   nRBC 1.0 (H) 0.0 - 0.2 %    ASSESSMENT: Herbert Williams is a 55 y.o. male, 7 Days Post-Op s/p GIRDLESTONE ARTHROPLASTY LEFT HIP  CV/Blood loss: Acute blood loss anemia, Hgb 7.9 this morning. Received 1 unit PRBC 06/05/20  PLAN: Weightbearing: patient bedridden at baseline. May attempt to WBAT LLE for transfers Incisional and dressing care: Change PRN Showering: Ok to shower with assistance Orthopedic device(s):  None Pain management:  1. Tylenol 1000 mg q 8 hours scheduled 2. Robaxin 1000 mg q 6 hours scheduled 3. Oxycodone 10-20 mg q 4 hours PRN 4. Dilaudid 0.5-1 mg q 4 hours PRN VTE prophylaxis: Lovenox on hold, SCDs ID:  Ancef 2gm post op completed Foley/Lines:  No foley, KVO IVFs Dispo: Plan to recheck CBC at 1300 today. If hgb stable, plan for d/c to SNF later today. Continue therapies as tolerated. PT/OT recommending SNF.  Has bed offer at [facility] once medically stable  Follow - up plan: TBD  Contact information:  Katha Hamming  MD, Patrecia Pace PA-C. After hours and holidays please check [email or web address] for group call information for [facility]   Herbert Lech A. Ricci Barker, PA-C [phone] (office) [email or web address]

## 2020-06-06 NOTE — Progress Notes (Signed)
Physical Therapy Treatment Patient Details Name: Herbert Williams MRN: [id number] DOB: 1965-12-09 Today's Date: 06/06/2020    History of Present Illness Pt is an 55 y.o. male presents for left hip resection arthroplasty for chronic left hip pain.  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  He had a femoral neck fracture that was treated with a hemiarthroplasty multiple years ago.  He recently had a fall in January of this year where he injured his hip.  Since that time he has been having progressive worsening hip pain and inability to move it.    PT Comments    Pt agreeable to therapy. Pt with improved bed mobility sidelying>sit requiring assist of 2 just for safety. Pt with improved sit>stand on second trial but due to increase in pain and weakness in RLE pt unable to completely transfer to standing. Pt demonstrating increased weakness and decreased ROM in R LE, but states he was able to stand and pivot on R LE to w/c prior to admission. Pt will benefit from skilled PT to address deficits in balance, strength, ROM, coordination, endurance, pain and safety to maximize independence with functional mobility prior to discharge.     Follow Up Recommendations  SNF     Equipment Recommendations  None recommended by PT    Recommendations for Other Services       Precautions / Restrictions Precautions Precautions: Other (comment) Precaution Comments: significant spasticity in all extremities, LE > UE Restrictions Weight Bearing Restrictions: Yes LLE Weight Bearing: Weight bearing as tolerated    Mobility  Bed Mobility Overal bed mobility: Needs Assistance Bed Mobility: Rolling;Sidelying to Sit Rolling: Mod assist;+2 for physical assistance Sidelying to sit: +2 for physical assistance;Mod assist   Sit to supine: Mod assist;+2 for safety/equipment   General bed mobility comments: Rolling R mod A. max +2 rolling L for hygiene. Mod of 2  for safety due to increase tone for sidelying>sit    Transfers Overall transfer level: Needs assistance Equipment used: 2 person hand held assist Transfers: Sit to/from Stand Sit to Stand: Max assist;+2 physical assistance         General transfer comment: performed x 2 trials wtih assist of 2. Improved use of R LE pressing into standing second trial. Pt with increase in pain during transfers  Ambulation/Gait                 Stairs             Wheelchair Mobility    Modified Rankin (Stroke Patients Only)       Balance Overall balance assessment: Needs assistance Sitting-balance support: Bilateral upper extremity supported;Feet supported Sitting balance-Leahy Scale: Poor                                      Cognition                                              Exercises      General Comments        Pertinent Vitals/Pain Pain Score: 9  Pain Location: L hip    Home Living  Prior Function            PT Goals (current goals can now be found in the care plan section) Acute Rehab PT Goals Patient Stated Goal: stop the pain PT Goal Formulation: With patient Time For Goal Achievement: 06/14/20 Potential to Achieve Goals: Good Progress towards PT goals: Progressing toward goals    Frequency    Min 3X/week      PT Plan Current plan remains appropriate    Co-evaluation PT/OT/SLP Co-Evaluation/Treatment: Yes Reason for Co-Treatment: Complexity of the patient's impairments (multi-system involvement);For patient/therapist safety;To address functional/ADL transfers PT goals addressed during session: Mobility/safety with mobility;Balance;Strengthening/ROM        AM-PAC PT "6 Clicks" Mobility   Outcome Measure  Help needed turning from your back to your side while in a flat bed without using bedrails?: A Lot Help needed moving from lying on your back to sitting on the side of  a flat bed without using bedrails?: A Lot Help needed moving to and from a bed to a chair (including a wheelchair)?: Total Help needed standing up from a chair using your arms (e.g., wheelchair or bedside chair)?: Total Help needed to walk in hospital room?: Total Help needed climbing 3-5 steps with a railing? : Total 6 Click Score: 8    End of Session Equipment Utilized During Treatment: Gait belt Activity Tolerance: Patient limited by pain Patient left: in bed;with call bell/phone within reach;with family/visitor present Nurse Communication: Mobility status PT Visit Diagnosis: Other abnormalities of gait and mobility (R26.89);History of falling (Z91.81)     Time: 4010-2725 PT Time Calculation (min) (ACUTE ONLY): 26 min  Charges:  $Therapeutic Activity: 8-22 mins                     Lyanne Co, DPT Acute Rehabilitation Services [phone]   [place] 06/06/2020, 11:39 AM

## 2020-06-06 NOTE — Progress Notes (Signed)
Report called to RN at [facility]. All questions answered.

## 2020-06-10 DIAGNOSIS — M25552 Pain in left hip: Secondary | ICD-10-CM | POA: Diagnosis not present

## 2020-06-10 DIAGNOSIS — R5381 Other malaise: Secondary | ICD-10-CM | POA: Diagnosis not present

## 2020-06-10 DIAGNOSIS — R262 Difficulty in walking, not elsewhere classified: Secondary | ICD-10-CM | POA: Diagnosis not present

## 2020-06-10 DIAGNOSIS — M6281 Muscle weakness (generalized): Secondary | ICD-10-CM | POA: Diagnosis not present

## 2020-06-13 DIAGNOSIS — K219 Gastro-esophageal reflux disease without esophagitis: Secondary | ICD-10-CM | POA: Diagnosis not present

## 2020-06-13 DIAGNOSIS — Z471 Aftercare following joint replacement surgery: Secondary | ICD-10-CM | POA: Diagnosis not present

## 2020-06-13 DIAGNOSIS — M62838 Other muscle spasm: Secondary | ICD-10-CM | POA: Diagnosis not present

## 2020-06-13 DIAGNOSIS — M25552 Pain in left hip: Secondary | ICD-10-CM | POA: Diagnosis not present

## 2020-06-18 DIAGNOSIS — N3946 Mixed incontinence: Secondary | ICD-10-CM | POA: Diagnosis not present

## 2020-06-18 DIAGNOSIS — M6281 Muscle weakness (generalized): Secondary | ICD-10-CM | POA: Diagnosis not present

## 2020-06-18 DIAGNOSIS — L89223 Pressure ulcer of left hip, stage 3: Secondary | ICD-10-CM | POA: Diagnosis not present

## 2020-06-19 DIAGNOSIS — R1084 Generalized abdominal pain: Secondary | ICD-10-CM | POA: Diagnosis not present

## 2020-06-19 DIAGNOSIS — R300 Dysuria: Secondary | ICD-10-CM | POA: Diagnosis not present

## 2020-06-19 DIAGNOSIS — K5900 Constipation, unspecified: Secondary | ICD-10-CM | POA: Diagnosis not present

## 2020-06-19 DIAGNOSIS — R3915 Urgency of urination: Secondary | ICD-10-CM | POA: Diagnosis not present

## 2020-06-19 DIAGNOSIS — G894 Chronic pain syndrome: Secondary | ICD-10-CM | POA: Diagnosis not present

## 2020-06-23 DIAGNOSIS — S73002D Unspecified subluxation of left hip, subsequent encounter: Secondary | ICD-10-CM | POA: Diagnosis not present

## 2020-06-25 DIAGNOSIS — L89223 Pressure ulcer of left hip, stage 3: Secondary | ICD-10-CM | POA: Diagnosis not present

## 2020-06-25 DIAGNOSIS — M6281 Muscle weakness (generalized): Secondary | ICD-10-CM | POA: Diagnosis not present

## 2020-06-25 DIAGNOSIS — N3946 Mixed incontinence: Secondary | ICD-10-CM | POA: Diagnosis not present

## 2020-06-25 DIAGNOSIS — Z1283 Encounter for screening for malignant neoplasm of skin: Secondary | ICD-10-CM | POA: Diagnosis not present

## 2020-07-04 DIAGNOSIS — D649 Anemia, unspecified: Secondary | ICD-10-CM | POA: Diagnosis not present

## 2020-07-04 DIAGNOSIS — R109 Unspecified abdominal pain: Secondary | ICD-10-CM | POA: Diagnosis not present

## 2020-07-04 DIAGNOSIS — D75839 Thrombocytosis, unspecified: Secondary | ICD-10-CM | POA: Diagnosis not present

## 2020-07-04 DIAGNOSIS — M25552 Pain in left hip: Secondary | ICD-10-CM | POA: Diagnosis not present

## 2020-07-22 DIAGNOSIS — S73002D Unspecified subluxation of left hip, subsequent encounter: Secondary | ICD-10-CM | POA: Diagnosis not present

## 2020-07-29 DIAGNOSIS — R262 Difficulty in walking, not elsewhere classified: Secondary | ICD-10-CM | POA: Diagnosis not present

## 2020-07-29 DIAGNOSIS — M25552 Pain in left hip: Secondary | ICD-10-CM | POA: Diagnosis not present

## 2020-07-29 DIAGNOSIS — R5381 Other malaise: Secondary | ICD-10-CM | POA: Diagnosis not present

## 2020-07-29 DIAGNOSIS — M6281 Muscle weakness (generalized): Secondary | ICD-10-CM | POA: Diagnosis not present

## 2020-07-30 ENCOUNTER — Encounter: Payer: Self-pay | Admitting: Physical Medicine & Rehabilitation

## 2020-07-31 DIAGNOSIS — H6123 Impacted cerumen, bilateral: Secondary | ICD-10-CM | POA: Diagnosis not present

## 2020-07-31 DIAGNOSIS — R221 Localized swelling, mass and lump, neck: Secondary | ICD-10-CM | POA: Diagnosis not present

## 2020-08-05 DIAGNOSIS — R5381 Other malaise: Secondary | ICD-10-CM | POA: Diagnosis not present

## 2020-08-05 DIAGNOSIS — M6281 Muscle weakness (generalized): Secondary | ICD-10-CM | POA: Diagnosis not present

## 2020-08-05 DIAGNOSIS — M25552 Pain in left hip: Secondary | ICD-10-CM | POA: Diagnosis not present

## 2020-08-05 DIAGNOSIS — R262 Difficulty in walking, not elsewhere classified: Secondary | ICD-10-CM | POA: Diagnosis not present

## 2020-08-13 DIAGNOSIS — M25552 Pain in left hip: Secondary | ICD-10-CM | POA: Diagnosis not present

## 2020-08-13 DIAGNOSIS — R262 Difficulty in walking, not elsewhere classified: Secondary | ICD-10-CM | POA: Diagnosis not present

## 2020-08-13 DIAGNOSIS — R5381 Other malaise: Secondary | ICD-10-CM | POA: Diagnosis not present

## 2020-08-13 DIAGNOSIS — M6281 Muscle weakness (generalized): Secondary | ICD-10-CM | POA: Diagnosis not present

## 2020-08-19 DIAGNOSIS — R200 Anesthesia of skin: Secondary | ICD-10-CM | POA: Diagnosis not present

## 2020-08-19 DIAGNOSIS — R339 Retention of urine, unspecified: Secondary | ICD-10-CM | POA: Diagnosis not present

## 2020-08-19 DIAGNOSIS — Z87898 Personal history of other specified conditions: Secondary | ICD-10-CM | POA: Diagnosis not present

## 2020-08-19 DIAGNOSIS — R031 Nonspecific low blood-pressure reading: Secondary | ICD-10-CM | POA: Diagnosis not present

## 2020-08-19 DIAGNOSIS — Z982 Presence of cerebrospinal fluid drainage device: Secondary | ICD-10-CM | POA: Diagnosis not present

## 2020-08-19 DIAGNOSIS — I10 Essential (primary) hypertension: Secondary | ICD-10-CM | POA: Diagnosis not present

## 2020-08-20 DIAGNOSIS — R200 Anesthesia of skin: Secondary | ICD-10-CM | POA: Diagnosis not present

## 2020-08-20 DIAGNOSIS — L22 Diaper dermatitis: Secondary | ICD-10-CM | POA: Diagnosis not present

## 2020-08-27 DIAGNOSIS — G910 Communicating hydrocephalus: Secondary | ICD-10-CM | POA: Diagnosis not present

## 2020-09-01 ENCOUNTER — Other Ambulatory Visit: Payer: Self-pay | Admitting: Neurological Surgery

## 2020-09-01 DIAGNOSIS — G910 Communicating hydrocephalus: Secondary | ICD-10-CM

## 2020-09-02 DIAGNOSIS — S73002D Unspecified subluxation of left hip, subsequent encounter: Secondary | ICD-10-CM | POA: Diagnosis not present

## 2020-09-04 DIAGNOSIS — R339 Retention of urine, unspecified: Secondary | ICD-10-CM | POA: Diagnosis not present

## 2020-09-04 DIAGNOSIS — N390 Urinary tract infection, site not specified: Secondary | ICD-10-CM | POA: Diagnosis not present

## 2020-09-04 DIAGNOSIS — Z139 Encounter for screening, unspecified: Secondary | ICD-10-CM | POA: Diagnosis not present

## 2020-09-04 DIAGNOSIS — M25552 Pain in left hip: Secondary | ICD-10-CM | POA: Diagnosis not present

## 2020-09-04 DIAGNOSIS — R300 Dysuria: Secondary | ICD-10-CM | POA: Diagnosis not present

## 2020-09-05 DIAGNOSIS — R300 Dysuria: Secondary | ICD-10-CM | POA: Diagnosis not present

## 2020-09-05 DIAGNOSIS — R339 Retention of urine, unspecified: Secondary | ICD-10-CM | POA: Diagnosis not present

## 2020-09-09 ENCOUNTER — Other Ambulatory Visit: Payer: Self-pay

## 2020-09-09 ENCOUNTER — Encounter: Payer: Self-pay | Admitting: Physical Medicine & Rehabilitation

## 2020-09-09 ENCOUNTER — Encounter: Payer: Medicare HMO | Attending: Physical Medicine & Rehabilitation | Admitting: Physical Medicine & Rehabilitation

## 2020-09-09 VITALS — BP 108/64 | HR 65 | Temp 97.8°F | Ht 67.0 in

## 2020-09-09 DIAGNOSIS — G8250 Quadriplegia, unspecified: Secondary | ICD-10-CM | POA: Diagnosis not present

## 2020-09-09 NOTE — Progress Notes (Signed)
Subjective:    Patient ID: Herbert Williams, male    DOB: 08-15-1965, 55 y.o.   MRN: [id number] Accompanied by mother who uses a walker and is hard of hearing, she also cannot help with history. HPI 55 yo male with hx of brain tumor in 1978 with craniotomy for resection, the patient does not recall having had radiation therapy Hx hyrocephalus with VP shunt followed by Dr Ellene Route.  Last visit with Dr Ellene Route was a couple weeks ago and he ordered a brain scan to check on shunt  Had Left sided weakness in UE and LE Spasms in knee flexor spasms in LLE for many years Also having RIght knee spasms for a couple weeks L hip girdlestone 05/30/20, Dr Doreatha Martin, this was done because of an acetabular fracture of prior prosthetic hip  Patient is wheelchair-bound he lives in a skilled nursing facility and requires assistance for ADLs and transfers He denies any significant upper extremity spasms Pain Inventory Average Pain 8 Pain Right Now 0 My pain is intermittent, sharp, burning, dull, and stabbing  LOCATION OF PAIN  left hip, left leg, feet, hand, arms, chest  BOWEL Number of stools per week: 4  Oral laxative use Yes  Type of laxative Miralax Enema or suppository use No  History of colostomy No  Incontinent No   BLADDER Pads In and out cath, frequency n/a Able to self cath No  Bladder incontinence Yes  Frequent urination Yes Leakage with coughing No  Difficulty starting stream Yes  Incomplete bladder emptying No    Mobility ability to climb steps?  no do you drive?  no use a wheelchair needs help with transfers Do you have any goals in this area?  yes  Function disabled: date disabled 65  Neuro/Psych bladder control problems bowel control problems weakness numbness tremor tingling trouble walking spasms depression anxiety  Prior Studies Any changes since last visit?  no  Physicians involved in your care Any changes since last visit?  no NEW PATIENT   No family  history on file. Social History   Socioeconomic History   Marital status: Single    Spouse name: Not on file   Number of children: Not on file   Years of education: Not on file   Highest education level: Not on file  Occupational History   Not on file  Tobacco Use   Smoking status: Former    Packs/day: 0.50    Years: 38.00    Pack years: 19.00    Types: Cigarettes    Quit date: 02/2020    Years since quitting: 0.5   Smokeless tobacco: Never  Vaping Use   Vaping Use: Never used  Substance and Sexual Activity   Alcohol use: Not Currently    Comment: daily 3-4 cans beer, None since Jan 2022   Drug use: No   Sexual activity: Not on file  Other Topics Concern   Not on file  Social History Narrative   Not on file   Social Determinants of Health   Financial Resource Strain: Not on file  Food Insecurity: Not on file  Transportation Needs: Not on file  Physical Activity: Not on file  Stress: Not on file  Social Connections: Not on file   Past Surgical History:  Procedure Laterality Date   BRAIN SURGERY     EYE SURGERY Left    cataracts removed   GIRDLESTONE ARTHROPLASTY Left 05/30/2020   Procedure: GIRDLESTONE ARTHROPLASTY LEFT HIP;  Surgeon: Shona Needles, MD;  Location: [place];  Service: Orthopedics;  Laterality: Left;   KNEE ARTHROSCOPY Left 09/11/2013   Procedure: LEFT ARTHROSCOPY WITH FIXATION OF FEMUR FX.;  Surgeon: Renette Butters, MD;  Location: [place];  Service: Orthopedics;  Laterality: Left;   PERCUTANEOUS PINNING Left 09/11/2013   Procedure: PERCUTANEOUS PINNING EXTREMITY;  Surgeon: Renette Butters, MD;  Location: [place];  Service: Orthopedics;  Laterality: Left;   TOTAL HIP ARTHROPLASTY Left    Past Medical History:  Diagnosis Date   Anxiety    claustrophobic   Brain tumor ([place]) 1979   no current problems, no deficits   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   Brain tumor - Patient with moderate tremor and ataxia uses walker   GERD  (gastroesophageal reflux disease) 09/10/2013   HTN (hypertension) 09/10/2013   Hyperlipemia 09/10/2013   BP 108/64   Pulse 65   Temp 97.8 F (36.6 C)   Ht '5\' 7"'$  (1.702 m)   SpO2 95%   BMI 28.18 kg/m   Opioid Risk Score:   Fall Risk Score:  `1  Depression screen PHQ 2/9  Depression screen PHQ 2/9 09/09/2020  Decreased Interest 3  Down, Depressed, Hopeless 1  PHQ - 2 Score 4  Altered sleeping 1  Tired, decreased energy 1  Change in appetite 0  Feeling bad or failure about yourself  3  Trouble concentrating 0  Moving slowly or fidgety/restless 1  Suicidal thoughts 0  PHQ-9 Score 10    Review of Systems  Gastrointestinal:  Positive for constipation. Negative for diarrhea.  Genitourinary:  Positive for frequency.  Musculoskeletal:  Positive for gait problem.  Neurological:  Positive for tremors and weakness.       Spasms, tingling  Psychiatric/Behavioral:         Depression & anxiety      Objective:   Physical Exam Vitals and nursing note reviewed.  Constitutional:      Appearance: He is normal weight.  HENT:     Head: Normocephalic and atraumatic.  Eyes:     Extraocular Movements: Extraocular movements intact.     Conjunctiva/sclera: Conjunctivae normal.     Pupils: Pupils are equal, round, and reactive to light.  Cardiovascular:     Rate and Rhythm: Normal rate and regular rhythm.  Pulmonary:     Effort: Pulmonary effort is normal. No respiratory distress.     Breath sounds: Normal breath sounds.  Abdominal:     General: Abdomen is flat. Bowel sounds are normal.     Palpations: Abdomen is soft.  Musculoskeletal:     Cervical back: Normal range of motion.  Skin:    General: Skin is warm and dry.  Neurological:     General: No focal deficit present.     Mental Status: He is alert and oriented to person, place, and time.     Cranial Nerves: Dysarthria present.     Sensory: Sensation is intact.     Motor: Weakness and abnormal muscle tone present.      Coordination: Coordination abnormal.     Gait: Gait abnormal.     Comments: Dysmetria bilateral finger-nose-finger testing bilateral upper extremities Heel-to-shin not attempted due to lower extremity weakness. Motor strength in upper extremities 4/5 in the deltoid, bicep, tricep, grip Lower extremity 3 - hip flexor knee extensor and ankle dorsiflexor bilaterally although somewhat stronger in the right lower extremity than on the left side. Tone MAS 3/4 in the left knee flexors MAS 3 right knee flexors  Psychiatric:        Mood and Affect: Mood normal.   No significant spasticity in upper limbs there is hyperactive DTRs 3+ bilateral upper extremities.       Assessment & Plan:  #1.  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  He also has chronic hydrocephalus requiring VP shunt.  He is undergoing neurosurgical reevaluation with imaging studies of shunt/hydrocephalus pending. He does have significant knee flexor spasticity that is causing problems with sleep at night as well as that positioning. Would recommend botulinum toxin injection, left hamstrings 200 units Right hamstrings 100 units.

## 2020-09-12 DIAGNOSIS — F411 Generalized anxiety disorder: Secondary | ICD-10-CM | POA: Diagnosis not present

## 2020-09-12 DIAGNOSIS — M62838 Other muscle spasm: Secondary | ICD-10-CM | POA: Diagnosis not present

## 2020-09-12 DIAGNOSIS — Z96642 Presence of left artificial hip joint: Secondary | ICD-10-CM | POA: Diagnosis not present

## 2020-09-12 DIAGNOSIS — M25552 Pain in left hip: Secondary | ICD-10-CM | POA: Diagnosis not present

## 2020-09-12 DIAGNOSIS — R339 Retention of urine, unspecified: Secondary | ICD-10-CM | POA: Diagnosis not present

## 2020-09-18 ENCOUNTER — Ambulatory Visit
Admission: RE | Admit: 2020-09-18 | Discharge: 2020-09-18 | Disposition: A | Discharge status: Home / Self Care | Payer: Medicare HMO | Source: Ambulatory Visit | Attending: Neurological Surgery | Admitting: Neurological Surgery

## 2020-09-18 ENCOUNTER — Other Ambulatory Visit: Payer: Self-pay

## 2020-09-18 DIAGNOSIS — R202 Paresthesia of skin: Secondary | ICD-10-CM | POA: Diagnosis not present

## 2020-09-18 DIAGNOSIS — G9389 Other specified disorders of brain: Secondary | ICD-10-CM | POA: Diagnosis not present

## 2020-09-18 DIAGNOSIS — G910 Communicating hydrocephalus: Secondary | ICD-10-CM

## 2020-10-07 DIAGNOSIS — B351 Tinea unguium: Secondary | ICD-10-CM | POA: Diagnosis not present

## 2020-10-07 DIAGNOSIS — R5381 Other malaise: Secondary | ICD-10-CM | POA: Diagnosis not present

## 2020-10-07 DIAGNOSIS — M6281 Muscle weakness (generalized): Secondary | ICD-10-CM | POA: Diagnosis not present

## 2020-10-07 DIAGNOSIS — I739 Peripheral vascular disease, unspecified: Secondary | ICD-10-CM | POA: Diagnosis not present

## 2020-10-07 DIAGNOSIS — R262 Difficulty in walking, not elsewhere classified: Secondary | ICD-10-CM | POA: Diagnosis not present

## 2020-10-07 DIAGNOSIS — M25552 Pain in left hip: Secondary | ICD-10-CM | POA: Diagnosis not present

## 2020-10-07 DIAGNOSIS — L853 Xerosis cutis: Secondary | ICD-10-CM | POA: Diagnosis not present

## 2020-10-09 DIAGNOSIS — M25552 Pain in left hip: Secondary | ICD-10-CM | POA: Diagnosis not present

## 2020-10-09 DIAGNOSIS — R350 Frequency of micturition: Secondary | ICD-10-CM | POA: Diagnosis not present

## 2020-10-09 DIAGNOSIS — R3915 Urgency of urination: Secondary | ICD-10-CM | POA: Diagnosis not present

## 2020-10-09 DIAGNOSIS — R339 Retention of urine, unspecified: Secondary | ICD-10-CM | POA: Diagnosis not present

## 2020-10-09 DIAGNOSIS — M6281 Muscle weakness (generalized): Secondary | ICD-10-CM | POA: Diagnosis not present

## 2020-10-09 DIAGNOSIS — Z96642 Presence of left artificial hip joint: Secondary | ICD-10-CM | POA: Diagnosis not present

## 2020-10-29 DIAGNOSIS — Z961 Presence of intraocular lens: Secondary | ICD-10-CM | POA: Diagnosis not present

## 2020-10-29 DIAGNOSIS — I10 Essential (primary) hypertension: Secondary | ICD-10-CM | POA: Diagnosis not present

## 2020-10-29 DIAGNOSIS — H524 Presbyopia: Secondary | ICD-10-CM | POA: Diagnosis not present

## 2020-10-29 DIAGNOSIS — H2511 Age-related nuclear cataract, right eye: Secondary | ICD-10-CM | POA: Diagnosis not present

## 2020-10-29 DIAGNOSIS — H04123 Dry eye syndrome of bilateral lacrimal glands: Secondary | ICD-10-CM | POA: Diagnosis not present

## 2020-11-03 DIAGNOSIS — R339 Retention of urine, unspecified: Secondary | ICD-10-CM | POA: Diagnosis not present

## 2020-11-03 DIAGNOSIS — R451 Restlessness and agitation: Secondary | ICD-10-CM | POA: Diagnosis not present

## 2020-11-06 ENCOUNTER — Other Ambulatory Visit: Payer: Self-pay

## 2020-11-06 ENCOUNTER — Encounter: Payer: Self-pay | Admitting: Physical Medicine & Rehabilitation

## 2020-11-06 ENCOUNTER — Encounter: Payer: Medicare HMO | Attending: Physical Medicine & Rehabilitation | Admitting: Physical Medicine & Rehabilitation

## 2020-11-06 VITALS — BP 156/76 | HR 76 | Temp 98.9°F | Ht 67.0 in | Wt 179.9 lb

## 2020-11-06 DIAGNOSIS — G8250 Quadriplegia, unspecified: Secondary | ICD-10-CM

## 2020-11-06 NOTE — Patient Instructions (Signed)

## 2020-11-06 NOTE — Progress Notes (Signed)
Botox Injection for spasticity using needle EMG guidance  Dilution: 50 Units/ml Indication: Severe spasticity which interferes with ADL,mobility and/or  hygiene and is unresponsive to medication management and other conservative care Informed consent was obtained after describing risks and benefits of the procedure with the patient. This includes bleeding, bruising, infection, excessive weakness, or medication side effects. A REMS form is on file and signed. Needle: 50mm 25g  needle electrode Number of units per muscle  HamstringsLeft medial 100U, Left Lateral 100U Right medial 100U All injections were done after obtaining appropriate EMG activity and after negative drawback for blood. The patient tolerated the procedure well. Post procedure instructions were given. A followup appointment was made.

## 2020-11-18 DIAGNOSIS — S73002D Unspecified subluxation of left hip, subsequent encounter: Secondary | ICD-10-CM | POA: Diagnosis not present

## 2020-11-19 DIAGNOSIS — N400 Enlarged prostate without lower urinary tract symptoms: Secondary | ICD-10-CM | POA: Diagnosis not present

## 2020-11-19 DIAGNOSIS — I10 Essential (primary) hypertension: Secondary | ICD-10-CM | POA: Diagnosis not present

## 2020-11-19 DIAGNOSIS — K5900 Constipation, unspecified: Secondary | ICD-10-CM | POA: Diagnosis not present

## 2020-11-19 DIAGNOSIS — G8250 Quadriplegia, unspecified: Secondary | ICD-10-CM | POA: Diagnosis not present

## 2020-11-19 DIAGNOSIS — E785 Hyperlipidemia, unspecified: Secondary | ICD-10-CM | POA: Diagnosis not present

## 2020-11-19 DIAGNOSIS — Z6827 Body mass index (BMI) 27.0-27.9, adult: Secondary | ICD-10-CM | POA: Diagnosis not present

## 2020-11-19 DIAGNOSIS — H04129 Dry eye syndrome of unspecified lacrimal gland: Secondary | ICD-10-CM | POA: Diagnosis not present

## 2020-11-19 DIAGNOSIS — M6281 Muscle weakness (generalized): Secondary | ICD-10-CM | POA: Diagnosis not present

## 2020-11-20 DIAGNOSIS — G8250 Quadriplegia, unspecified: Secondary | ICD-10-CM | POA: Diagnosis not present

## 2020-11-20 DIAGNOSIS — M6281 Muscle weakness (generalized): Secondary | ICD-10-CM | POA: Diagnosis not present

## 2020-11-21 DIAGNOSIS — Z139 Encounter for screening, unspecified: Secondary | ICD-10-CM | POA: Diagnosis not present

## 2020-11-21 DIAGNOSIS — S73002D Unspecified subluxation of left hip, subsequent encounter: Secondary | ICD-10-CM | POA: Diagnosis not present

## 2020-11-21 DIAGNOSIS — E559 Vitamin D deficiency, unspecified: Secondary | ICD-10-CM | POA: Diagnosis not present

## 2020-11-21 DIAGNOSIS — I10 Essential (primary) hypertension: Secondary | ICD-10-CM | POA: Diagnosis not present

## 2020-11-21 DIAGNOSIS — Z471 Aftercare following joint replacement surgery: Secondary | ICD-10-CM | POA: Diagnosis not present

## 2020-11-21 DIAGNOSIS — E785 Hyperlipidemia, unspecified: Secondary | ICD-10-CM | POA: Diagnosis not present

## 2020-11-21 DIAGNOSIS — M25552 Pain in left hip: Secondary | ICD-10-CM | POA: Diagnosis not present

## 2020-11-21 DIAGNOSIS — G8250 Quadriplegia, unspecified: Secondary | ICD-10-CM | POA: Diagnosis not present

## 2020-11-21 DIAGNOSIS — N390 Urinary tract infection, site not specified: Secondary | ICD-10-CM | POA: Diagnosis not present

## 2020-11-21 DIAGNOSIS — G8929 Other chronic pain: Secondary | ICD-10-CM | POA: Diagnosis not present

## 2020-11-21 DIAGNOSIS — Z961 Presence of intraocular lens: Secondary | ICD-10-CM | POA: Diagnosis not present

## 2020-11-21 DIAGNOSIS — M6281 Muscle weakness (generalized): Secondary | ICD-10-CM | POA: Diagnosis not present

## 2020-11-21 DIAGNOSIS — S32421D Displaced fracture of posterior wall of right acetabulum, subsequent encounter for fracture with routine healing: Secondary | ICD-10-CM | POA: Diagnosis not present

## 2020-11-24 DIAGNOSIS — M6281 Muscle weakness (generalized): Secondary | ICD-10-CM | POA: Diagnosis not present

## 2020-11-24 DIAGNOSIS — G8250 Quadriplegia, unspecified: Secondary | ICD-10-CM | POA: Diagnosis not present

## 2020-11-25 DIAGNOSIS — G629 Polyneuropathy, unspecified: Secondary | ICD-10-CM | POA: Diagnosis not present

## 2020-11-25 DIAGNOSIS — G8250 Quadriplegia, unspecified: Secondary | ICD-10-CM | POA: Diagnosis not present

## 2020-11-25 DIAGNOSIS — M6281 Muscle weakness (generalized): Secondary | ICD-10-CM | POA: Diagnosis not present

## 2020-11-25 DIAGNOSIS — E7849 Other hyperlipidemia: Secondary | ICD-10-CM | POA: Diagnosis not present

## 2020-11-25 DIAGNOSIS — E559 Vitamin D deficiency, unspecified: Secondary | ICD-10-CM | POA: Diagnosis not present

## 2020-11-25 DIAGNOSIS — M79604 Pain in right leg: Secondary | ICD-10-CM | POA: Diagnosis not present

## 2020-11-26 DIAGNOSIS — G8250 Quadriplegia, unspecified: Secondary | ICD-10-CM | POA: Diagnosis not present

## 2020-11-26 DIAGNOSIS — M6281 Muscle weakness (generalized): Secondary | ICD-10-CM | POA: Diagnosis not present

## 2020-11-27 DIAGNOSIS — G8250 Quadriplegia, unspecified: Secondary | ICD-10-CM | POA: Diagnosis not present

## 2020-11-27 DIAGNOSIS — M6281 Muscle weakness (generalized): Secondary | ICD-10-CM | POA: Diagnosis not present

## 2020-11-28 DIAGNOSIS — R112 Nausea with vomiting, unspecified: Secondary | ICD-10-CM | POA: Diagnosis not present

## 2020-11-28 DIAGNOSIS — R12 Heartburn: Secondary | ICD-10-CM | POA: Diagnosis not present

## 2020-11-28 DIAGNOSIS — K30 Functional dyspepsia: Secondary | ICD-10-CM | POA: Diagnosis not present

## 2020-11-28 DIAGNOSIS — M6281 Muscle weakness (generalized): Secondary | ICD-10-CM | POA: Diagnosis not present

## 2020-11-28 DIAGNOSIS — R109 Unspecified abdominal pain: Secondary | ICD-10-CM | POA: Diagnosis not present

## 2020-11-28 DIAGNOSIS — G8250 Quadriplegia, unspecified: Secondary | ICD-10-CM | POA: Diagnosis not present

## 2020-11-28 DIAGNOSIS — K219 Gastro-esophageal reflux disease without esophagitis: Secondary | ICD-10-CM | POA: Diagnosis not present

## 2020-12-02 DIAGNOSIS — M6281 Muscle weakness (generalized): Secondary | ICD-10-CM | POA: Diagnosis not present

## 2020-12-02 DIAGNOSIS — G8250 Quadriplegia, unspecified: Secondary | ICD-10-CM | POA: Diagnosis not present

## 2020-12-03 DIAGNOSIS — G8250 Quadriplegia, unspecified: Secondary | ICD-10-CM | POA: Diagnosis not present

## 2020-12-03 DIAGNOSIS — M6281 Muscle weakness (generalized): Secondary | ICD-10-CM | POA: Diagnosis not present

## 2020-12-04 DIAGNOSIS — M6281 Muscle weakness (generalized): Secondary | ICD-10-CM | POA: Diagnosis not present

## 2020-12-04 DIAGNOSIS — G8250 Quadriplegia, unspecified: Secondary | ICD-10-CM | POA: Diagnosis not present

## 2020-12-05 DIAGNOSIS — G8250 Quadriplegia, unspecified: Secondary | ICD-10-CM | POA: Diagnosis not present

## 2020-12-05 DIAGNOSIS — M6281 Muscle weakness (generalized): Secondary | ICD-10-CM | POA: Diagnosis not present

## 2020-12-06 DIAGNOSIS — M6281 Muscle weakness (generalized): Secondary | ICD-10-CM | POA: Diagnosis not present

## 2020-12-06 DIAGNOSIS — G8250 Quadriplegia, unspecified: Secondary | ICD-10-CM | POA: Diagnosis not present

## 2020-12-09 DIAGNOSIS — M6281 Muscle weakness (generalized): Secondary | ICD-10-CM | POA: Diagnosis not present

## 2020-12-09 DIAGNOSIS — G8250 Quadriplegia, unspecified: Secondary | ICD-10-CM | POA: Diagnosis not present

## 2020-12-10 DIAGNOSIS — G8250 Quadriplegia, unspecified: Secondary | ICD-10-CM | POA: Diagnosis not present

## 2020-12-10 DIAGNOSIS — M6281 Muscle weakness (generalized): Secondary | ICD-10-CM | POA: Diagnosis not present

## 2020-12-11 DIAGNOSIS — G8250 Quadriplegia, unspecified: Secondary | ICD-10-CM | POA: Diagnosis not present

## 2020-12-11 DIAGNOSIS — M6281 Muscle weakness (generalized): Secondary | ICD-10-CM | POA: Diagnosis not present

## 2020-12-12 DIAGNOSIS — M6281 Muscle weakness (generalized): Secondary | ICD-10-CM | POA: Diagnosis not present

## 2020-12-12 DIAGNOSIS — G8250 Quadriplegia, unspecified: Secondary | ICD-10-CM | POA: Diagnosis not present

## 2020-12-13 DIAGNOSIS — M6281 Muscle weakness (generalized): Secondary | ICD-10-CM | POA: Diagnosis not present

## 2020-12-13 DIAGNOSIS — G8250 Quadriplegia, unspecified: Secondary | ICD-10-CM | POA: Diagnosis not present

## 2020-12-15 DIAGNOSIS — G8250 Quadriplegia, unspecified: Secondary | ICD-10-CM | POA: Diagnosis not present

## 2020-12-15 DIAGNOSIS — M6281 Muscle weakness (generalized): Secondary | ICD-10-CM | POA: Diagnosis not present

## 2020-12-16 DIAGNOSIS — M6281 Muscle weakness (generalized): Secondary | ICD-10-CM | POA: Diagnosis not present

## 2020-12-16 DIAGNOSIS — G8250 Quadriplegia, unspecified: Secondary | ICD-10-CM | POA: Diagnosis not present

## 2021-01-05 DIAGNOSIS — G8250 Quadriplegia, unspecified: Secondary | ICD-10-CM | POA: Diagnosis not present

## 2021-01-05 DIAGNOSIS — M6281 Muscle weakness (generalized): Secondary | ICD-10-CM | POA: Diagnosis not present

## 2021-01-06 ENCOUNTER — Encounter: Payer: Self-pay | Admitting: Physical Medicine & Rehabilitation

## 2021-01-06 ENCOUNTER — Encounter: Payer: Medicare HMO | Attending: Physical Medicine & Rehabilitation | Admitting: Physical Medicine & Rehabilitation

## 2021-01-06 ENCOUNTER — Other Ambulatory Visit: Payer: Self-pay

## 2021-01-06 VITALS — BP 103/63 | HR 65

## 2021-01-06 DIAGNOSIS — G8250 Quadriplegia, unspecified: Secondary | ICD-10-CM | POA: Insufficient documentation

## 2021-01-06 NOTE — Progress Notes (Signed)
Subjective:    Patient ID: Herbert Williams, male    DOB: 08/21/1965, 55 y.o.   MRN: [id number]  HPI 55 year old male with history of remote brain tumor chronic hydrocephalus requiring VP shunt who has primary rehab diagnosis of spastic quadriplegia.  He is mainly wheelchair-bound he lives in a skilled nursing facility.  He was recently moved from the rehab section to the long-term section.  Physical therapy was stopped after a bit but now his recently restarted. The patient still has difficulty with extending his knees to the point to getting his foot flat on the ground.  11/06/2020 Botox injection total of 300 units HamstringsLeft medial 100U, Left Lateral 100U Right medial 100U Pain Inventory Average Pain 7 Pain Right Now 7 My pain is sharp  In the last 24 hours, has pain interfered with the following? General activity 7 Relation with others 7 Enjoyment of life 7 What TIME of day is your pain at its worst? varies Sleep (in general) Good  Pain is worse with: some activites Pain improves with: rest Relief from Meds:  na  No family history on file. Social History   Socioeconomic History   Marital status: Single    Spouse name: Not on file   Number of children: Not on file   Years of education: Not on file   Highest education level: Not on file  Occupational History   Not on file  Tobacco Use   Smoking status: Former    Packs/day: 0.50    Years: 38.00    Pack years: 19.00    Types: Cigarettes    Quit date: 02/2020    Years since quitting: 0.9   Smokeless tobacco: Never  Vaping Use   Vaping Use: Never used  Substance and Sexual Activity   Alcohol use: Not Currently    Comment: daily 3-4 cans beer, None since Jan 2022   Drug use: No   Sexual activity: Not on file  Other Topics Concern   Not on file  Social History Narrative   Not on file   Social Determinants of Health   Financial Resource Strain: Not on file  Food Insecurity: Not on file  Transportation  Needs: Not on file  Physical Activity: Not on file  Stress: Not on file  Social Connections: Not on file   Past Surgical History:  Procedure Laterality Date   BRAIN SURGERY     EYE SURGERY Left    cataracts removed   GIRDLESTONE ARTHROPLASTY Left 05/30/2020   Procedure: GIRDLESTONE ARTHROPLASTY LEFT HIP;  Surgeon: Shona Needles, MD;  Location: [place];  Service: Orthopedics;  Laterality: Left;   KNEE ARTHROSCOPY Left 09/11/2013   Procedure: LEFT ARTHROSCOPY WITH FIXATION OF FEMUR FX.;  Surgeon: Renette Butters, MD;  Location: [place];  Service: Orthopedics;  Laterality: Left;   PERCUTANEOUS PINNING Left 09/11/2013   Procedure: PERCUTANEOUS PINNING EXTREMITY;  Surgeon: Renette Butters, MD;  Location: [place];  Service: Orthopedics;  Laterality: Left;   TOTAL HIP ARTHROPLASTY Left    Past Surgical History:  Procedure Laterality Date   BRAIN SURGERY     EYE SURGERY Left    cataracts removed   GIRDLESTONE ARTHROPLASTY Left 05/30/2020   Procedure: GIRDLESTONE ARTHROPLASTY LEFT HIP;  Surgeon: Shona Needles, MD;  Location: [place];  Service: Orthopedics;  Laterality: Left;   KNEE ARTHROSCOPY Left 09/11/2013   Procedure: LEFT ARTHROSCOPY WITH FIXATION OF FEMUR FX.;  Surgeon: Renette Butters, MD;  Location: [place];  Service:  Orthopedics;  Laterality: Left;   PERCUTANEOUS PINNING Left 09/11/2013   Procedure: PERCUTANEOUS PINNING EXTREMITY;  Surgeon: Renette Butters, MD;  Location: [place];  Service: Orthopedics;  Laterality: Left;   TOTAL HIP ARTHROPLASTY Left    Past Medical History:  Diagnosis Date   Anxiety    claustrophobic   Brain tumor ([place]) 1979   no current problems, no deficits   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   Brain tumor - Patient with moderate tremor and ataxia uses walker   GERD (gastroesophageal reflux disease) 09/10/2013   HTN (hypertension) 09/10/2013   Hyperlipemia 09/10/2013   BP 103/63   Pulse 65   SpO2 94%   Opioid Risk Score:   Fall Risk Score:   `1  Depression screen PHQ 2/9  Depression screen [facility] 2/9 11/06/2020 09/09/2020  Decreased Interest 1 3  Down, Depressed, Hopeless 1 1  PHQ - 2 Score 2 4  Altered sleeping - 1  Tired, decreased energy - 1  Change in appetite - 0  Feeling bad or failure about yourself  - 3  Trouble concentrating - 0  Moving slowly or fidgety/restless - 1  Suicidal thoughts - 0  PHQ-9 Score - 10      Review of Systems  Musculoskeletal:        Rt arm  Neurological:  Positive for weakness.      Objective:   Physical Exam Vitals and nursing note reviewed.  Constitutional:      Appearance: He is normal weight.  HENT:     Head: Normocephalic and atraumatic.  Eyes:     Extraocular Movements: Extraocular movements intact.     Conjunctiva/sclera: Conjunctivae normal.     Pupils: Pupils are equal, round, and reactive to light.  Cardiovascular:     Rate and Rhythm: Normal rate and regular rhythm.  Pulmonary:     Effort: Pulmonary effort is normal.     Breath sounds: Normal breath sounds.  Abdominal:     General: Abdomen is flat. Bowel sounds are normal.     Palpations: Abdomen is soft.  Musculoskeletal:     Cervical back: Normal range of motion and neck supple.     Comments: Right knee gets to -20 degrees from full extension left knee gets to -30 degrees from full extension. There is prominence of right medial hamstring tendon with passive extension of the right knee, there is prominence of left lateral and medial hamstring tendon with passive extension.  Skin:    General: Skin is warm and dry.  Neurological:     Mental Status: He is alert and oriented to person, place, and time.  Psychiatric:        Mood and Affect: Mood normal.        Behavior: Behavior normal.   MAS 3/4 right knee flexors MAS 3/4 left knee flexors  Motor strength is 3 - bilateral knee extension bilateral hip flexion and bilateral ankle dorsiflexion.  Motor strength is 4 - bilateral deltoid by stress of grip        Assessment & Plan:  1.  Spastic quadriparesis but worse in the lower extremity than in the upper extremity.  He has both weakness as well as spasticity and may have some underlying contracture.  I do think it is worthwhile to continue with the botulinum toxin injections and will increase dose to the following Right medial hamstring 150 units Left medial hamstring 125 units Left lateral hamstring 125 units Discussed plan with patient and his  mother.  His mother was not sure of which MD is taking care of which problem.  We discussed the role of orthopedics as well as physical medicine rehab. Repeat injection in approximately 4 weeks

## 2021-01-07 DIAGNOSIS — G8250 Quadriplegia, unspecified: Secondary | ICD-10-CM | POA: Diagnosis not present

## 2021-01-07 DIAGNOSIS — M6281 Muscle weakness (generalized): Secondary | ICD-10-CM | POA: Diagnosis not present

## 2021-01-08 DIAGNOSIS — M6281 Muscle weakness (generalized): Secondary | ICD-10-CM | POA: Diagnosis not present

## 2021-01-08 DIAGNOSIS — G8250 Quadriplegia, unspecified: Secondary | ICD-10-CM | POA: Diagnosis not present

## 2021-01-09 DIAGNOSIS — G8250 Quadriplegia, unspecified: Secondary | ICD-10-CM | POA: Diagnosis not present

## 2021-01-09 DIAGNOSIS — M6281 Muscle weakness (generalized): Secondary | ICD-10-CM | POA: Diagnosis not present

## 2021-01-10 DIAGNOSIS — M6281 Muscle weakness (generalized): Secondary | ICD-10-CM | POA: Diagnosis not present

## 2021-01-10 DIAGNOSIS — G8250 Quadriplegia, unspecified: Secondary | ICD-10-CM | POA: Diagnosis not present

## 2021-01-11 DIAGNOSIS — M6281 Muscle weakness (generalized): Secondary | ICD-10-CM | POA: Diagnosis not present

## 2021-01-11 DIAGNOSIS — G8250 Quadriplegia, unspecified: Secondary | ICD-10-CM | POA: Diagnosis not present

## 2021-01-13 DIAGNOSIS — M6281 Muscle weakness (generalized): Secondary | ICD-10-CM | POA: Diagnosis not present

## 2021-01-13 DIAGNOSIS — G8250 Quadriplegia, unspecified: Secondary | ICD-10-CM | POA: Diagnosis not present

## 2021-01-14 DIAGNOSIS — G8250 Quadriplegia, unspecified: Secondary | ICD-10-CM | POA: Diagnosis not present

## 2021-01-14 DIAGNOSIS — M6281 Muscle weakness (generalized): Secondary | ICD-10-CM | POA: Diagnosis not present

## 2021-01-15 DIAGNOSIS — G8250 Quadriplegia, unspecified: Secondary | ICD-10-CM | POA: Diagnosis not present

## 2021-01-15 DIAGNOSIS — M6281 Muscle weakness (generalized): Secondary | ICD-10-CM | POA: Diagnosis not present

## 2021-01-16 DIAGNOSIS — G8250 Quadriplegia, unspecified: Secondary | ICD-10-CM | POA: Diagnosis not present

## 2021-01-16 DIAGNOSIS — M6281 Muscle weakness (generalized): Secondary | ICD-10-CM | POA: Diagnosis not present

## 2021-01-19 DIAGNOSIS — G8250 Quadriplegia, unspecified: Secondary | ICD-10-CM | POA: Diagnosis not present

## 2021-01-19 DIAGNOSIS — M6281 Muscle weakness (generalized): Secondary | ICD-10-CM | POA: Diagnosis not present

## 2021-01-20 DIAGNOSIS — G8250 Quadriplegia, unspecified: Secondary | ICD-10-CM | POA: Diagnosis not present

## 2021-01-20 DIAGNOSIS — M6281 Muscle weakness (generalized): Secondary | ICD-10-CM | POA: Diagnosis not present

## 2021-01-21 DIAGNOSIS — M6281 Muscle weakness (generalized): Secondary | ICD-10-CM | POA: Diagnosis not present

## 2021-01-21 DIAGNOSIS — G8250 Quadriplegia, unspecified: Secondary | ICD-10-CM | POA: Diagnosis not present

## 2021-01-22 DIAGNOSIS — G8250 Quadriplegia, unspecified: Secondary | ICD-10-CM | POA: Diagnosis not present

## 2021-01-22 DIAGNOSIS — M6281 Muscle weakness (generalized): Secondary | ICD-10-CM | POA: Diagnosis not present

## 2021-01-23 DIAGNOSIS — G8250 Quadriplegia, unspecified: Secondary | ICD-10-CM | POA: Diagnosis not present

## 2021-01-23 DIAGNOSIS — M6281 Muscle weakness (generalized): Secondary | ICD-10-CM | POA: Diagnosis not present

## 2021-01-26 DIAGNOSIS — G8250 Quadriplegia, unspecified: Secondary | ICD-10-CM | POA: Diagnosis not present

## 2021-01-26 DIAGNOSIS — M6281 Muscle weakness (generalized): Secondary | ICD-10-CM | POA: Diagnosis not present

## 2021-01-27 DIAGNOSIS — G8250 Quadriplegia, unspecified: Secondary | ICD-10-CM | POA: Diagnosis not present

## 2021-01-27 DIAGNOSIS — M6281 Muscle weakness (generalized): Secondary | ICD-10-CM | POA: Diagnosis not present

## 2021-01-28 DIAGNOSIS — G8250 Quadriplegia, unspecified: Secondary | ICD-10-CM | POA: Diagnosis not present

## 2021-01-28 DIAGNOSIS — M6281 Muscle weakness (generalized): Secondary | ICD-10-CM | POA: Diagnosis not present

## 2021-01-29 DIAGNOSIS — G8250 Quadriplegia, unspecified: Secondary | ICD-10-CM | POA: Diagnosis not present

## 2021-01-29 DIAGNOSIS — M6281 Muscle weakness (generalized): Secondary | ICD-10-CM | POA: Diagnosis not present

## 2021-01-30 DIAGNOSIS — G8250 Quadriplegia, unspecified: Secondary | ICD-10-CM | POA: Diagnosis not present

## 2021-01-30 DIAGNOSIS — M6281 Muscle weakness (generalized): Secondary | ICD-10-CM | POA: Diagnosis not present

## 2021-02-11 DIAGNOSIS — R369 Urethral discharge, unspecified: Secondary | ICD-10-CM | POA: Diagnosis not present

## 2021-02-11 DIAGNOSIS — R300 Dysuria: Secondary | ICD-10-CM | POA: Diagnosis not present

## 2021-02-11 DIAGNOSIS — R339 Retention of urine, unspecified: Secondary | ICD-10-CM | POA: Diagnosis not present

## 2021-02-11 DIAGNOSIS — N390 Urinary tract infection, site not specified: Secondary | ICD-10-CM | POA: Diagnosis not present

## 2021-02-13 DIAGNOSIS — R338 Other retention of urine: Secondary | ICD-10-CM | POA: Diagnosis not present

## 2021-02-19 DIAGNOSIS — N3941 Urge incontinence: Secondary | ICD-10-CM | POA: Diagnosis not present

## 2021-02-19 DIAGNOSIS — R338 Other retention of urine: Secondary | ICD-10-CM | POA: Diagnosis not present

## 2021-02-24 ENCOUNTER — Ambulatory Visit: Payer: Medicare HMO | Admitting: Physical Medicine & Rehabilitation

## 2021-02-26 DIAGNOSIS — M79652 Pain in left thigh: Secondary | ICD-10-CM | POA: Diagnosis not present

## 2021-02-26 DIAGNOSIS — M79662 Pain in left lower leg: Secondary | ICD-10-CM | POA: Diagnosis not present

## 2021-02-26 DIAGNOSIS — M25562 Pain in left knee: Secondary | ICD-10-CM | POA: Diagnosis not present

## 2021-03-18 DIAGNOSIS — G894 Chronic pain syndrome: Secondary | ICD-10-CM | POA: Diagnosis not present

## 2021-03-18 DIAGNOSIS — R339 Retention of urine, unspecified: Secondary | ICD-10-CM | POA: Diagnosis not present

## 2021-03-18 DIAGNOSIS — H538 Other visual disturbances: Secondary | ICD-10-CM | POA: Diagnosis not present

## 2021-03-18 DIAGNOSIS — M62838 Other muscle spasm: Secondary | ICD-10-CM | POA: Diagnosis not present

## 2021-03-18 DIAGNOSIS — H04123 Dry eye syndrome of bilateral lacrimal glands: Secondary | ICD-10-CM | POA: Diagnosis not present

## 2021-03-31 ENCOUNTER — Encounter: Payer: Medicare HMO | Admitting: Physical Medicine & Rehabilitation

## 2021-04-02 ENCOUNTER — Other Ambulatory Visit: Payer: Self-pay

## 2021-04-02 ENCOUNTER — Encounter: Payer: Self-pay | Admitting: Physical Medicine & Rehabilitation

## 2021-04-02 ENCOUNTER — Encounter: Payer: Medicare HMO | Attending: Physical Medicine & Rehabilitation | Admitting: Physical Medicine & Rehabilitation

## 2021-04-02 VITALS — BP 141/67 | HR 71 | Temp 98.1°F | Ht 67.0 in

## 2021-04-02 DIAGNOSIS — M6281 Muscle weakness (generalized): Secondary | ICD-10-CM | POA: Diagnosis not present

## 2021-04-02 DIAGNOSIS — G8250 Quadriplegia, unspecified: Secondary | ICD-10-CM | POA: Insufficient documentation

## 2021-04-02 DIAGNOSIS — G800 Spastic quadriplegic cerebral palsy: Secondary | ICD-10-CM | POA: Diagnosis not present

## 2021-04-02 NOTE — Patient Instructions (Signed)

## 2021-04-02 NOTE — Progress Notes (Signed)
Botox Injection for spasticity using needle EMG guidance  Dilution: 50 Units/ml Indication: Severe spasticity which interferes with ADL,mobility and/or  hygiene and is unresponsive to medication management and other conservative care Informed consent was obtained after describing risks and benefits of the procedure with the patient. This includes bleeding, bruising, infection, excessive weakness, or medication side effects. A REMS form is on file and signed. Needle: 68mm 25g  needle electrode Number of units per muscle  HamstringsLeft medial 125U, Left Lateral 125U Right medial hamstrings 150U All injections were done after obtaining appropriate EMG activity and after negative drawback for blood. The patient tolerated the procedure well. Post procedure instructions were given. A followup appointment was made.

## 2021-04-03 DIAGNOSIS — G8250 Quadriplegia, unspecified: Secondary | ICD-10-CM | POA: Diagnosis not present

## 2021-04-03 DIAGNOSIS — M6281 Muscle weakness (generalized): Secondary | ICD-10-CM | POA: Diagnosis not present

## 2021-04-06 DIAGNOSIS — G8250 Quadriplegia, unspecified: Secondary | ICD-10-CM | POA: Diagnosis not present

## 2021-04-06 DIAGNOSIS — M6281 Muscle weakness (generalized): Secondary | ICD-10-CM | POA: Diagnosis not present

## 2021-04-07 DIAGNOSIS — G8250 Quadriplegia, unspecified: Secondary | ICD-10-CM | POA: Diagnosis not present

## 2021-04-07 DIAGNOSIS — M6281 Muscle weakness (generalized): Secondary | ICD-10-CM | POA: Diagnosis not present

## 2021-04-08 DIAGNOSIS — M6281 Muscle weakness (generalized): Secondary | ICD-10-CM | POA: Diagnosis not present

## 2021-04-08 DIAGNOSIS — G8250 Quadriplegia, unspecified: Secondary | ICD-10-CM | POA: Diagnosis not present

## 2021-04-09 DIAGNOSIS — G8250 Quadriplegia, unspecified: Secondary | ICD-10-CM | POA: Diagnosis not present

## 2021-04-09 DIAGNOSIS — M6281 Muscle weakness (generalized): Secondary | ICD-10-CM | POA: Diagnosis not present

## 2021-04-11 DIAGNOSIS — G8250 Quadriplegia, unspecified: Secondary | ICD-10-CM | POA: Diagnosis not present

## 2021-04-11 DIAGNOSIS — M6281 Muscle weakness (generalized): Secondary | ICD-10-CM | POA: Diagnosis not present

## 2021-04-12 DIAGNOSIS — G8250 Quadriplegia, unspecified: Secondary | ICD-10-CM | POA: Diagnosis not present

## 2021-04-12 DIAGNOSIS — M6281 Muscle weakness (generalized): Secondary | ICD-10-CM | POA: Diagnosis not present

## 2021-04-14 DIAGNOSIS — G8250 Quadriplegia, unspecified: Secondary | ICD-10-CM | POA: Diagnosis not present

## 2021-04-14 DIAGNOSIS — M6281 Muscle weakness (generalized): Secondary | ICD-10-CM | POA: Diagnosis not present

## 2021-04-15 DIAGNOSIS — G8250 Quadriplegia, unspecified: Secondary | ICD-10-CM | POA: Diagnosis not present

## 2021-04-15 DIAGNOSIS — M6281 Muscle weakness (generalized): Secondary | ICD-10-CM | POA: Diagnosis not present

## 2021-04-16 DIAGNOSIS — G8250 Quadriplegia, unspecified: Secondary | ICD-10-CM | POA: Diagnosis not present

## 2021-04-16 DIAGNOSIS — M6281 Muscle weakness (generalized): Secondary | ICD-10-CM | POA: Diagnosis not present

## 2021-04-17 DIAGNOSIS — M6281 Muscle weakness (generalized): Secondary | ICD-10-CM | POA: Diagnosis not present

## 2021-04-17 DIAGNOSIS — G8250 Quadriplegia, unspecified: Secondary | ICD-10-CM | POA: Diagnosis not present

## 2021-04-20 DIAGNOSIS — M6281 Muscle weakness (generalized): Secondary | ICD-10-CM | POA: Diagnosis not present

## 2021-04-20 DIAGNOSIS — G8250 Quadriplegia, unspecified: Secondary | ICD-10-CM | POA: Diagnosis not present

## 2021-04-21 DIAGNOSIS — M6281 Muscle weakness (generalized): Secondary | ICD-10-CM | POA: Diagnosis not present

## 2021-04-21 DIAGNOSIS — G8250 Quadriplegia, unspecified: Secondary | ICD-10-CM | POA: Diagnosis not present

## 2021-04-22 DIAGNOSIS — G8250 Quadriplegia, unspecified: Secondary | ICD-10-CM | POA: Diagnosis not present

## 2021-04-22 DIAGNOSIS — M6281 Muscle weakness (generalized): Secondary | ICD-10-CM | POA: Diagnosis not present

## 2021-04-23 DIAGNOSIS — M6281 Muscle weakness (generalized): Secondary | ICD-10-CM | POA: Diagnosis not present

## 2021-04-23 DIAGNOSIS — G8250 Quadriplegia, unspecified: Secondary | ICD-10-CM | POA: Diagnosis not present

## 2021-04-24 DIAGNOSIS — G8250 Quadriplegia, unspecified: Secondary | ICD-10-CM | POA: Diagnosis not present

## 2021-04-24 DIAGNOSIS — M6281 Muscle weakness (generalized): Secondary | ICD-10-CM | POA: Diagnosis not present

## 2021-04-27 DIAGNOSIS — I739 Peripheral vascular disease, unspecified: Secondary | ICD-10-CM | POA: Diagnosis not present

## 2021-04-27 DIAGNOSIS — B351 Tinea unguium: Secondary | ICD-10-CM | POA: Diagnosis not present

## 2021-04-27 DIAGNOSIS — M6281 Muscle weakness (generalized): Secondary | ICD-10-CM | POA: Diagnosis not present

## 2021-04-27 DIAGNOSIS — G8250 Quadriplegia, unspecified: Secondary | ICD-10-CM | POA: Diagnosis not present

## 2021-04-28 DIAGNOSIS — G8250 Quadriplegia, unspecified: Secondary | ICD-10-CM | POA: Diagnosis not present

## 2021-04-28 DIAGNOSIS — M6281 Muscle weakness (generalized): Secondary | ICD-10-CM | POA: Diagnosis not present

## 2021-04-29 DIAGNOSIS — M6281 Muscle weakness (generalized): Secondary | ICD-10-CM | POA: Diagnosis not present

## 2021-04-29 DIAGNOSIS — G8250 Quadriplegia, unspecified: Secondary | ICD-10-CM | POA: Diagnosis not present

## 2021-05-01 DIAGNOSIS — Z96642 Presence of left artificial hip joint: Secondary | ICD-10-CM | POA: Diagnosis not present

## 2021-05-01 DIAGNOSIS — S32421D Displaced fracture of posterior wall of right acetabulum, subsequent encounter for fracture with routine healing: Secondary | ICD-10-CM | POA: Diagnosis not present

## 2021-05-01 DIAGNOSIS — M24562 Contracture, left knee: Secondary | ICD-10-CM | POA: Diagnosis not present

## 2021-05-01 DIAGNOSIS — M24561 Contracture, right knee: Secondary | ICD-10-CM | POA: Diagnosis not present

## 2021-05-01 DIAGNOSIS — G8929 Other chronic pain: Secondary | ICD-10-CM | POA: Diagnosis not present

## 2021-05-01 DIAGNOSIS — K219 Gastro-esophageal reflux disease without esophagitis: Secondary | ICD-10-CM | POA: Diagnosis not present

## 2021-05-01 DIAGNOSIS — I10 Essential (primary) hypertension: Secondary | ICD-10-CM | POA: Diagnosis not present

## 2021-05-01 DIAGNOSIS — E785 Hyperlipidemia, unspecified: Secondary | ICD-10-CM | POA: Diagnosis not present

## 2021-05-01 DIAGNOSIS — M6281 Muscle weakness (generalized): Secondary | ICD-10-CM | POA: Diagnosis not present

## 2021-05-07 DIAGNOSIS — G8929 Other chronic pain: Secondary | ICD-10-CM | POA: Diagnosis not present

## 2021-05-07 DIAGNOSIS — Z96642 Presence of left artificial hip joint: Secondary | ICD-10-CM | POA: Diagnosis not present

## 2021-05-07 DIAGNOSIS — L609 Nail disorder, unspecified: Secondary | ICD-10-CM | POA: Diagnosis not present

## 2021-05-07 DIAGNOSIS — K219 Gastro-esophageal reflux disease without esophagitis: Secondary | ICD-10-CM | POA: Diagnosis not present

## 2021-05-11 DIAGNOSIS — M25552 Pain in left hip: Secondary | ICD-10-CM | POA: Diagnosis not present

## 2021-05-11 DIAGNOSIS — S73002D Unspecified subluxation of left hip, subsequent encounter: Secondary | ICD-10-CM | POA: Diagnosis not present

## 2021-05-11 DIAGNOSIS — R338 Other retention of urine: Secondary | ICD-10-CM | POA: Diagnosis not present

## 2021-05-11 DIAGNOSIS — H04123 Dry eye syndrome of bilateral lacrimal glands: Secondary | ICD-10-CM | POA: Diagnosis not present

## 2021-05-11 DIAGNOSIS — G8929 Other chronic pain: Secondary | ICD-10-CM | POA: Diagnosis not present

## 2021-05-11 DIAGNOSIS — G8250 Quadriplegia, unspecified: Secondary | ICD-10-CM | POA: Diagnosis not present

## 2021-05-11 DIAGNOSIS — E785 Hyperlipidemia, unspecified: Secondary | ICD-10-CM | POA: Diagnosis not present

## 2021-05-11 DIAGNOSIS — K219 Gastro-esophageal reflux disease without esophagitis: Secondary | ICD-10-CM | POA: Diagnosis not present

## 2021-05-11 DIAGNOSIS — Z961 Presence of intraocular lens: Secondary | ICD-10-CM | POA: Diagnosis not present

## 2021-05-13 DIAGNOSIS — K219 Gastro-esophageal reflux disease without esophagitis: Secondary | ICD-10-CM | POA: Diagnosis not present

## 2021-05-13 DIAGNOSIS — E785 Hyperlipidemia, unspecified: Secondary | ICD-10-CM | POA: Diagnosis not present

## 2021-05-13 DIAGNOSIS — M24561 Contracture, right knee: Secondary | ICD-10-CM | POA: Diagnosis not present

## 2021-05-13 DIAGNOSIS — G8929 Other chronic pain: Secondary | ICD-10-CM | POA: Diagnosis not present

## 2021-05-13 DIAGNOSIS — M24562 Contracture, left knee: Secondary | ICD-10-CM | POA: Diagnosis not present

## 2021-05-13 DIAGNOSIS — S32421D Displaced fracture of posterior wall of right acetabulum, subsequent encounter for fracture with routine healing: Secondary | ICD-10-CM | POA: Diagnosis not present

## 2021-05-13 DIAGNOSIS — R338 Other retention of urine: Secondary | ICD-10-CM | POA: Diagnosis not present

## 2021-05-13 DIAGNOSIS — I10 Essential (primary) hypertension: Secondary | ICD-10-CM | POA: Diagnosis not present

## 2021-05-13 DIAGNOSIS — M25552 Pain in left hip: Secondary | ICD-10-CM | POA: Diagnosis not present

## 2021-05-14 ENCOUNTER — Encounter: Payer: Medicare HMO | Attending: Physical Medicine & Rehabilitation | Admitting: Physical Medicine & Rehabilitation

## 2021-05-14 DIAGNOSIS — R3914 Feeling of incomplete bladder emptying: Secondary | ICD-10-CM | POA: Diagnosis not present

## 2021-05-14 DIAGNOSIS — K219 Gastro-esophageal reflux disease without esophagitis: Secondary | ICD-10-CM | POA: Diagnosis not present

## 2021-05-14 DIAGNOSIS — G8250 Quadriplegia, unspecified: Secondary | ICD-10-CM | POA: Insufficient documentation

## 2021-05-14 DIAGNOSIS — G8929 Other chronic pain: Secondary | ICD-10-CM | POA: Diagnosis not present

## 2021-05-14 DIAGNOSIS — N390 Urinary tract infection, site not specified: Secondary | ICD-10-CM | POA: Diagnosis not present

## 2021-05-14 DIAGNOSIS — N3941 Urge incontinence: Secondary | ICD-10-CM | POA: Diagnosis not present

## 2021-05-14 DIAGNOSIS — I10 Essential (primary) hypertension: Secondary | ICD-10-CM | POA: Diagnosis not present

## 2021-05-14 DIAGNOSIS — R338 Other retention of urine: Secondary | ICD-10-CM | POA: Diagnosis not present

## 2021-05-14 DIAGNOSIS — M24562 Contracture, left knee: Secondary | ICD-10-CM | POA: Diagnosis not present

## 2021-05-14 DIAGNOSIS — M24561 Contracture, right knee: Secondary | ICD-10-CM | POA: Diagnosis not present

## 2021-05-14 DIAGNOSIS — M6281 Muscle weakness (generalized): Secondary | ICD-10-CM | POA: Diagnosis not present

## 2021-05-14 DIAGNOSIS — E785 Hyperlipidemia, unspecified: Secondary | ICD-10-CM | POA: Diagnosis not present

## 2021-05-14 DIAGNOSIS — S32421D Displaced fracture of posterior wall of right acetabulum, subsequent encounter for fracture with routine healing: Secondary | ICD-10-CM | POA: Diagnosis not present

## 2021-05-15 DIAGNOSIS — G8929 Other chronic pain: Secondary | ICD-10-CM | POA: Diagnosis not present

## 2021-05-15 DIAGNOSIS — E785 Hyperlipidemia, unspecified: Secondary | ICD-10-CM | POA: Diagnosis not present

## 2021-05-15 DIAGNOSIS — I10 Essential (primary) hypertension: Secondary | ICD-10-CM | POA: Diagnosis not present

## 2021-05-15 DIAGNOSIS — R338 Other retention of urine: Secondary | ICD-10-CM | POA: Diagnosis not present

## 2021-05-15 DIAGNOSIS — M24561 Contracture, right knee: Secondary | ICD-10-CM | POA: Diagnosis not present

## 2021-05-15 DIAGNOSIS — N390 Urinary tract infection, site not specified: Secondary | ICD-10-CM | POA: Diagnosis not present

## 2021-05-15 DIAGNOSIS — K219 Gastro-esophageal reflux disease without esophagitis: Secondary | ICD-10-CM | POA: Diagnosis not present

## 2021-05-15 DIAGNOSIS — H04123 Dry eye syndrome of bilateral lacrimal glands: Secondary | ICD-10-CM | POA: Diagnosis not present

## 2021-05-15 DIAGNOSIS — G8250 Quadriplegia, unspecified: Secondary | ICD-10-CM | POA: Diagnosis not present

## 2021-05-18 DIAGNOSIS — H6123 Impacted cerumen, bilateral: Secondary | ICD-10-CM | POA: Diagnosis not present

## 2021-05-18 DIAGNOSIS — G8929 Other chronic pain: Secondary | ICD-10-CM | POA: Diagnosis not present

## 2021-05-18 DIAGNOSIS — K219 Gastro-esophageal reflux disease without esophagitis: Secondary | ICD-10-CM | POA: Diagnosis not present

## 2021-05-18 DIAGNOSIS — S32421D Displaced fracture of posterior wall of right acetabulum, subsequent encounter for fracture with routine healing: Secondary | ICD-10-CM | POA: Diagnosis not present

## 2021-05-18 DIAGNOSIS — I10 Essential (primary) hypertension: Secondary | ICD-10-CM | POA: Diagnosis not present

## 2021-05-18 DIAGNOSIS — E785 Hyperlipidemia, unspecified: Secondary | ICD-10-CM | POA: Diagnosis not present

## 2021-05-18 DIAGNOSIS — M24562 Contracture, left knee: Secondary | ICD-10-CM | POA: Diagnosis not present

## 2021-05-18 DIAGNOSIS — M24561 Contracture, right knee: Secondary | ICD-10-CM | POA: Diagnosis not present

## 2021-05-18 DIAGNOSIS — N390 Urinary tract infection, site not specified: Secondary | ICD-10-CM | POA: Diagnosis not present

## 2021-05-20 DIAGNOSIS — I10 Essential (primary) hypertension: Secondary | ICD-10-CM | POA: Diagnosis not present

## 2021-05-20 DIAGNOSIS — K219 Gastro-esophageal reflux disease without esophagitis: Secondary | ICD-10-CM | POA: Diagnosis not present

## 2021-05-20 DIAGNOSIS — S32421D Displaced fracture of posterior wall of right acetabulum, subsequent encounter for fracture with routine healing: Secondary | ICD-10-CM | POA: Diagnosis not present

## 2021-05-20 DIAGNOSIS — M24562 Contracture, left knee: Secondary | ICD-10-CM | POA: Diagnosis not present

## 2021-05-20 DIAGNOSIS — M6281 Muscle weakness (generalized): Secondary | ICD-10-CM | POA: Diagnosis not present

## 2021-05-20 DIAGNOSIS — G8929 Other chronic pain: Secondary | ICD-10-CM | POA: Diagnosis not present

## 2021-05-20 DIAGNOSIS — N390 Urinary tract infection, site not specified: Secondary | ICD-10-CM | POA: Diagnosis not present

## 2021-05-20 DIAGNOSIS — E785 Hyperlipidemia, unspecified: Secondary | ICD-10-CM | POA: Diagnosis not present

## 2021-05-20 DIAGNOSIS — M24561 Contracture, right knee: Secondary | ICD-10-CM | POA: Diagnosis not present

## 2021-05-21 ENCOUNTER — Encounter: Payer: Medicare HMO | Admitting: Physical Medicine & Rehabilitation

## 2021-05-21 ENCOUNTER — Encounter: Payer: Self-pay | Admitting: Physical Medicine & Rehabilitation

## 2021-05-21 VITALS — BP 110/69 | HR 61 | Ht 67.0 in

## 2021-05-21 DIAGNOSIS — G8250 Quadriplegia, unspecified: Secondary | ICD-10-CM | POA: Diagnosis not present

## 2021-05-21 NOTE — Progress Notes (Signed)
? ?Subjective:  ? ? Patient ID: Herbert Williams, male    DOB: 1965-06-01, 56 y.o.   MRN: [id number] ? ?HPI ? ?Pt feels like botulinum toxin injection was helpful in reducing the degree of tightness in his legs.  He feels like it worked on the left side more than the right side.  We discussed dosing, he received 250 units of Botox in the left leg and 150 units on the right leg.  We also discussed that he is got the highest dose i.e. 400 units that is allowed. ?We also discussed that he does have residual tightness which is most likely contracture since botulinum toxin would not be helpful in alleviating contracture. ? ?Pain Inventory ?Average Pain 9 ?Pain Right Now 7 ?My pain is constant and sharp ? ?In the last 24 hours, has pain interfered with the following? ?General activity 10 ?Relation with others 10 ?Enjoyment of life 10 ?What TIME of day is your pain at its worst? morning , daytime, evening, and night ?Sleep (in general) Good ? ?Pain is worse with: standing ?Pain improves with: therapy/exercise and medication ?Relief from Meds: 8 ? ?History reviewed. No pertinent family history. ?Social History  ? ?Socioeconomic History  ? Marital status: Single  ?  Spouse name: Not on file  ? Number of children: Not on file  ? Years of education: Not on file  ? Highest education level: Not on file  ?Occupational History  ? Not on file  ?Tobacco Use  ? Smoking status: Former  ?  Packs/day: 0.50  ?  Years: 38.00  ?  Pack years: 19.00  ?  Types: Cigarettes  ?  Quit date: 02/2020  ?  Years since quitting: 1.2  ? Smokeless tobacco: Current  ?Vaping Use  ? Vaping Use: Never used  ?Substance and Sexual Activity  ? Alcohol use: Not Currently  ?  Comment: daily 3-4 cans beer, None since Jan 2022  ? Drug use: No  ? Sexual activity: Not on file  ?Other Topics Concern  ? Not on file  ?Social History Narrative  ? Not on file  ? ?Social Determinants of Health  ? ?Financial Resource Strain: Not on file  ?Food Insecurity: Not on file   ?Transportation Needs: Not on file  ?Physical Activity: Not on file  ?Stress: Not on file  ?Social Connections: Not on file  ? ?Past Surgical History:  ?Procedure Laterality Date  ? BRAIN SURGERY    ? EYE SURGERY Left   ? cataracts removed  ? GIRDLESTONE ARTHROPLASTY Left 05/30/2020  ? Procedure: GIRDLESTONE ARTHROPLASTY LEFT HIP;  Surgeon: Shona Needles, MD;  Location: [place];  Service: Orthopedics;  Laterality: Left;  ? KNEE ARTHROSCOPY Left 09/11/2013  ? Procedure: LEFT ARTHROSCOPY WITH FIXATION OF FEMUR FX.;  Surgeon: Renette Butters, MD;  Location: [place];  Service: Orthopedics;  Laterality: Left;  ? PERCUTANEOUS PINNING Left 09/11/2013  ? Procedure: PERCUTANEOUS PINNING EXTREMITY;  Surgeon: Renette Butters, MD;  Location: [place];  Service: Orthopedics;  Laterality: Left;  ? TOTAL HIP ARTHROPLASTY Left   ? ?Past Surgical History:  ?Procedure Laterality Date  ? BRAIN SURGERY    ? EYE SURGERY Left   ? cataracts removed  ? GIRDLESTONE ARTHROPLASTY Left 05/30/2020  ? Procedure: GIRDLESTONE ARTHROPLASTY LEFT HIP;  Surgeon: Shona Needles, MD;  Location: [place];  Service: Orthopedics;  Laterality: Left;  ? KNEE ARTHROSCOPY Left 09/11/2013  ? Procedure: LEFT ARTHROSCOPY WITH FIXATION OF FEMUR FX.;  Surgeon: Ernesta Amble  Percell Miller, MD;  Location: [place];  Service: Orthopedics;  Laterality: Left;  ? PERCUTANEOUS PINNING Left 09/11/2013  ? Procedure: PERCUTANEOUS PINNING EXTREMITY;  Surgeon: Renette Butters, MD;  Location: [place];  Service: Orthopedics;  Laterality: Left;  ? TOTAL HIP ARTHROPLASTY Left   ? ?Past Medical History:  ?Diagnosis Date  ? Anxiety   ? claustrophobic  ? Brain tumor ([place]) 1979  ? no current problems, no deficits  ? 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 Brain tumor - Patient with moderate tremor and ataxia uses walker  ? GERD (gastroesophageal reflux disease) 09/10/2013  ? HTN (hypertension) 09/10/2013  ? Hyperlipemia 09/10/2013  ? ?BP 110/69   Pulse 61   Ht '5\' 7"'$  (1.702 m)   SpO2 95%   BMI  28.18 kg/m?  ? ?Opioid Risk Score:   ?Fall Risk Score:  `1 ? ?Depression screen PHQ 2/9 ? ? ?  05/21/2021  ? 11:06 AM 04/02/2021  ? 12:55 PM 11/06/2020  ?  1:43 PM 09/09/2020  ? 10:39 AM  ?Depression screen PHQ 2/9  ?Decreased Interest 3 0 1 3  ?Down, Depressed, Hopeless 3 0 1 1  ?PHQ - 2 Score 6 0 2 4  ?Altered sleeping    1  ?Tired, decreased energy    1  ?Change in appetite    0  ?Feeling bad or failure about yourself     3  ?Trouble concentrating    0  ?Moving slowly or fidgety/restless    1  ?Suicidal thoughts    0  ?PHQ-9 Score    10  ?  ? ?Review of Systems  ?Constitutional: Negative.   ?HENT: Negative.    ?Eyes: Negative.   ?Respiratory: Negative.    ?Cardiovascular: Negative.   ?Gastrointestinal: Negative.   ?Endocrine: Negative.   ?Genitourinary: Negative.   ?Musculoskeletal:  Positive for gait problem.  ?Skin: Negative.   ?Allergic/Immunologic: Negative.   ?Hematological: Negative.   ?Psychiatric/Behavioral: Negative.    ? ?   ?Objective:  ? Physical Exam ?Vitals and nursing note reviewed.  ?Constitutional:   ?   Appearance: He is normal weight.  ?HENT:  ?   Head: Normocephalic and atraumatic.  ?Neurological:  ?   Mental Status: He is alert.  ?   Cranial Nerves: Dysarthria and facial asymmetry present.  ?   Comments: Facial weakness on right side ?Motor strength is 3 - bilateral knee extensors and ankle dorsiflexors as well as hip extensors trace knee flexors  ? ?Gen NAD ?Right knee -30 deg, left knee -45 deg from full ext  ?Tone MAS 4 in Bilateral knee flexors ?Neg SLR ?No evidence of knee or ankle effusion  ?No pain with ROM ?   ?Assessment & Plan:  ? Spastic quadriparesis due to hx remote brain tumor and hydrocephalus.  Hamstring spasticity ?Improved with botulinum toxin, better results on the left side higher dose, will equalize.  Left lower extremity is tighter likely has greater degree of contracture. ?We will repeat in 6 weeks Botox or Xeomin ?Right hamstring 200 units ?Left hamstring 200 units ?

## 2021-05-29 DIAGNOSIS — E785 Hyperlipidemia, unspecified: Secondary | ICD-10-CM | POA: Diagnosis not present

## 2021-05-29 DIAGNOSIS — K219 Gastro-esophageal reflux disease without esophagitis: Secondary | ICD-10-CM | POA: Diagnosis not present

## 2021-05-29 DIAGNOSIS — S32421D Displaced fracture of posterior wall of right acetabulum, subsequent encounter for fracture with routine healing: Secondary | ICD-10-CM | POA: Diagnosis not present

## 2021-05-29 DIAGNOSIS — N390 Urinary tract infection, site not specified: Secondary | ICD-10-CM | POA: Diagnosis not present

## 2021-05-29 DIAGNOSIS — M24561 Contracture, right knee: Secondary | ICD-10-CM | POA: Diagnosis not present

## 2021-05-29 DIAGNOSIS — G8929 Other chronic pain: Secondary | ICD-10-CM | POA: Diagnosis not present

## 2021-05-29 DIAGNOSIS — I10 Essential (primary) hypertension: Secondary | ICD-10-CM | POA: Diagnosis not present

## 2021-05-29 DIAGNOSIS — R338 Other retention of urine: Secondary | ICD-10-CM | POA: Diagnosis not present

## 2021-05-29 DIAGNOSIS — M24562 Contracture, left knee: Secondary | ICD-10-CM | POA: Diagnosis not present

## 2021-06-02 DIAGNOSIS — L89893 Pressure ulcer of other site, stage 3: Secondary | ICD-10-CM | POA: Diagnosis not present

## 2021-06-02 DIAGNOSIS — M6281 Muscle weakness (generalized): Secondary | ICD-10-CM | POA: Diagnosis not present

## 2021-06-02 DIAGNOSIS — L89153 Pressure ulcer of sacral region, stage 3: Secondary | ICD-10-CM | POA: Diagnosis not present

## 2021-06-08 DIAGNOSIS — L84 Corns and callosities: Secondary | ICD-10-CM | POA: Diagnosis not present

## 2021-06-08 DIAGNOSIS — Z96642 Presence of left artificial hip joint: Secondary | ICD-10-CM | POA: Diagnosis not present

## 2021-06-08 DIAGNOSIS — L609 Nail disorder, unspecified: Secondary | ICD-10-CM | POA: Diagnosis not present

## 2021-06-11 DIAGNOSIS — K219 Gastro-esophageal reflux disease without esophagitis: Secondary | ICD-10-CM | POA: Diagnosis not present

## 2021-06-11 DIAGNOSIS — M6281 Muscle weakness (generalized): Secondary | ICD-10-CM | POA: Diagnosis not present

## 2021-06-11 DIAGNOSIS — M24561 Contracture, right knee: Secondary | ICD-10-CM | POA: Diagnosis not present

## 2021-06-11 DIAGNOSIS — G8929 Other chronic pain: Secondary | ICD-10-CM | POA: Diagnosis not present

## 2021-06-11 DIAGNOSIS — E785 Hyperlipidemia, unspecified: Secondary | ICD-10-CM | POA: Diagnosis not present

## 2021-06-11 DIAGNOSIS — S32421D Displaced fracture of posterior wall of right acetabulum, subsequent encounter for fracture with routine healing: Secondary | ICD-10-CM | POA: Diagnosis not present

## 2021-06-11 DIAGNOSIS — M24562 Contracture, left knee: Secondary | ICD-10-CM | POA: Diagnosis not present

## 2021-06-11 DIAGNOSIS — R338 Other retention of urine: Secondary | ICD-10-CM | POA: Diagnosis not present

## 2021-06-11 DIAGNOSIS — I10 Essential (primary) hypertension: Secondary | ICD-10-CM | POA: Diagnosis not present

## 2021-06-12 DIAGNOSIS — Z79899 Other long term (current) drug therapy: Secondary | ICD-10-CM | POA: Diagnosis not present

## 2021-06-12 DIAGNOSIS — K5900 Constipation, unspecified: Secondary | ICD-10-CM | POA: Diagnosis not present

## 2021-06-15 DIAGNOSIS — Z96642 Presence of left artificial hip joint: Secondary | ICD-10-CM | POA: Diagnosis not present

## 2021-06-15 DIAGNOSIS — M24561 Contracture, right knee: Secondary | ICD-10-CM | POA: Diagnosis not present

## 2021-06-15 DIAGNOSIS — M24562 Contracture, left knee: Secondary | ICD-10-CM | POA: Diagnosis not present

## 2021-06-15 DIAGNOSIS — M6281 Muscle weakness (generalized): Secondary | ICD-10-CM | POA: Diagnosis not present

## 2021-06-15 DIAGNOSIS — S32421D Displaced fracture of posterior wall of right acetabulum, subsequent encounter for fracture with routine healing: Secondary | ICD-10-CM | POA: Diagnosis not present

## 2021-06-15 DIAGNOSIS — K219 Gastro-esophageal reflux disease without esophagitis: Secondary | ICD-10-CM | POA: Diagnosis not present

## 2021-06-15 DIAGNOSIS — G8929 Other chronic pain: Secondary | ICD-10-CM | POA: Diagnosis not present

## 2021-06-15 DIAGNOSIS — I10 Essential (primary) hypertension: Secondary | ICD-10-CM | POA: Diagnosis not present

## 2021-06-15 DIAGNOSIS — E785 Hyperlipidemia, unspecified: Secondary | ICD-10-CM | POA: Diagnosis not present

## 2021-06-24 DIAGNOSIS — R369 Urethral discharge, unspecified: Secondary | ICD-10-CM | POA: Diagnosis not present

## 2021-06-24 DIAGNOSIS — Z87440 Personal history of urinary (tract) infections: Secondary | ICD-10-CM | POA: Diagnosis not present

## 2021-06-24 DIAGNOSIS — R300 Dysuria: Secondary | ICD-10-CM | POA: Diagnosis not present

## 2021-06-25 DIAGNOSIS — N390 Urinary tract infection, site not specified: Secondary | ICD-10-CM | POA: Diagnosis not present

## 2021-06-29 DIAGNOSIS — N390 Urinary tract infection, site not specified: Secondary | ICD-10-CM | POA: Diagnosis not present

## 2021-06-29 DIAGNOSIS — H9193 Unspecified hearing loss, bilateral: Secondary | ICD-10-CM | POA: Diagnosis not present

## 2021-06-29 DIAGNOSIS — Z87440 Personal history of urinary (tract) infections: Secondary | ICD-10-CM | POA: Diagnosis not present

## 2021-06-30 DIAGNOSIS — H9193 Unspecified hearing loss, bilateral: Secondary | ICD-10-CM | POA: Diagnosis not present

## 2021-06-30 DIAGNOSIS — N390 Urinary tract infection, site not specified: Secondary | ICD-10-CM | POA: Diagnosis not present

## 2021-06-30 DIAGNOSIS — Z87440 Personal history of urinary (tract) infections: Secondary | ICD-10-CM | POA: Diagnosis not present

## 2021-06-30 DIAGNOSIS — G8929 Other chronic pain: Secondary | ICD-10-CM | POA: Diagnosis not present

## 2021-07-03 ENCOUNTER — Encounter: Payer: Medicare HMO | Admitting: Physical Medicine & Rehabilitation

## 2021-07-09 DIAGNOSIS — M19041 Primary osteoarthritis, right hand: Secondary | ICD-10-CM | POA: Diagnosis not present

## 2021-07-09 DIAGNOSIS — M79642 Pain in left hand: Secondary | ICD-10-CM | POA: Diagnosis not present

## 2021-07-09 DIAGNOSIS — M19042 Primary osteoarthritis, left hand: Secondary | ICD-10-CM | POA: Diagnosis not present

## 2021-07-09 DIAGNOSIS — L84 Corns and callosities: Secondary | ICD-10-CM | POA: Diagnosis not present

## 2021-07-09 DIAGNOSIS — M79641 Pain in right hand: Secondary | ICD-10-CM | POA: Diagnosis not present

## 2021-07-15 DIAGNOSIS — G8929 Other chronic pain: Secondary | ICD-10-CM | POA: Diagnosis not present

## 2021-07-15 DIAGNOSIS — M25552 Pain in left hip: Secondary | ICD-10-CM | POA: Diagnosis not present

## 2021-07-20 DIAGNOSIS — G629 Polyneuropathy, unspecified: Secondary | ICD-10-CM | POA: Diagnosis not present

## 2021-07-20 DIAGNOSIS — D367 Benign neoplasm of other specified sites: Secondary | ICD-10-CM | POA: Diagnosis not present

## 2021-07-20 DIAGNOSIS — K5900 Constipation, unspecified: Secondary | ICD-10-CM | POA: Diagnosis not present

## 2021-07-21 DIAGNOSIS — N4889 Other specified disorders of penis: Secondary | ICD-10-CM | POA: Diagnosis not present

## 2021-07-23 DIAGNOSIS — M79641 Pain in right hand: Secondary | ICD-10-CM | POA: Diagnosis not present

## 2021-07-23 DIAGNOSIS — N4889 Other specified disorders of penis: Secondary | ICD-10-CM | POA: Diagnosis not present

## 2021-07-23 DIAGNOSIS — G629 Polyneuropathy, unspecified: Secondary | ICD-10-CM | POA: Diagnosis not present

## 2021-07-23 DIAGNOSIS — M79642 Pain in left hand: Secondary | ICD-10-CM | POA: Diagnosis not present

## 2021-07-23 DIAGNOSIS — M25552 Pain in left hip: Secondary | ICD-10-CM | POA: Diagnosis not present

## 2021-07-26 DIAGNOSIS — G8250 Quadriplegia, unspecified: Secondary | ICD-10-CM | POA: Diagnosis not present

## 2021-07-26 DIAGNOSIS — M6281 Muscle weakness (generalized): Secondary | ICD-10-CM | POA: Diagnosis not present

## 2021-07-27 DIAGNOSIS — S73002D Unspecified subluxation of left hip, subsequent encounter: Secondary | ICD-10-CM | POA: Diagnosis not present

## 2021-07-27 DIAGNOSIS — M6281 Muscle weakness (generalized): Secondary | ICD-10-CM | POA: Diagnosis not present

## 2021-07-27 DIAGNOSIS — G8250 Quadriplegia, unspecified: Secondary | ICD-10-CM | POA: Diagnosis not present

## 2021-07-28 DIAGNOSIS — M6281 Muscle weakness (generalized): Secondary | ICD-10-CM | POA: Diagnosis not present

## 2021-07-28 DIAGNOSIS — G8250 Quadriplegia, unspecified: Secondary | ICD-10-CM | POA: Diagnosis not present

## 2021-07-29 DIAGNOSIS — G8250 Quadriplegia, unspecified: Secondary | ICD-10-CM | POA: Diagnosis not present

## 2021-07-29 DIAGNOSIS — M6281 Muscle weakness (generalized): Secondary | ICD-10-CM | POA: Diagnosis not present

## 2021-07-30 DIAGNOSIS — M6281 Muscle weakness (generalized): Secondary | ICD-10-CM | POA: Diagnosis not present

## 2021-07-30 DIAGNOSIS — G8250 Quadriplegia, unspecified: Secondary | ICD-10-CM | POA: Diagnosis not present

## 2021-08-03 DIAGNOSIS — M6281 Muscle weakness (generalized): Secondary | ICD-10-CM | POA: Diagnosis not present

## 2021-08-03 DIAGNOSIS — G8250 Quadriplegia, unspecified: Secondary | ICD-10-CM | POA: Diagnosis not present

## 2021-08-04 DIAGNOSIS — G8250 Quadriplegia, unspecified: Secondary | ICD-10-CM | POA: Diagnosis not present

## 2021-08-04 DIAGNOSIS — M6281 Muscle weakness (generalized): Secondary | ICD-10-CM | POA: Diagnosis not present

## 2021-08-05 DIAGNOSIS — M6281 Muscle weakness (generalized): Secondary | ICD-10-CM | POA: Diagnosis not present

## 2021-08-05 DIAGNOSIS — G8250 Quadriplegia, unspecified: Secondary | ICD-10-CM | POA: Diagnosis not present

## 2021-08-06 DIAGNOSIS — M6281 Muscle weakness (generalized): Secondary | ICD-10-CM | POA: Diagnosis not present

## 2021-08-06 DIAGNOSIS — K219 Gastro-esophageal reflux disease without esophagitis: Secondary | ICD-10-CM | POA: Diagnosis not present

## 2021-08-06 DIAGNOSIS — G8250 Quadriplegia, unspecified: Secondary | ICD-10-CM | POA: Diagnosis not present

## 2021-08-06 DIAGNOSIS — R300 Dysuria: Secondary | ICD-10-CM | POA: Diagnosis not present

## 2021-08-06 DIAGNOSIS — G894 Chronic pain syndrome: Secondary | ICD-10-CM | POA: Diagnosis not present

## 2021-08-06 DIAGNOSIS — N4889 Other specified disorders of penis: Secondary | ICD-10-CM | POA: Diagnosis not present

## 2021-08-06 DIAGNOSIS — L84 Corns and callosities: Secondary | ICD-10-CM | POA: Diagnosis not present

## 2021-08-07 DIAGNOSIS — M6281 Muscle weakness (generalized): Secondary | ICD-10-CM | POA: Diagnosis not present

## 2021-08-07 DIAGNOSIS — N390 Urinary tract infection, site not specified: Secondary | ICD-10-CM | POA: Diagnosis not present

## 2021-08-07 DIAGNOSIS — G8250 Quadriplegia, unspecified: Secondary | ICD-10-CM | POA: Diagnosis not present

## 2021-08-10 DIAGNOSIS — R300 Dysuria: Secondary | ICD-10-CM | POA: Diagnosis not present

## 2021-08-11 DIAGNOSIS — G8250 Quadriplegia, unspecified: Secondary | ICD-10-CM | POA: Diagnosis not present

## 2021-08-11 DIAGNOSIS — M6281 Muscle weakness (generalized): Secondary | ICD-10-CM | POA: Diagnosis not present

## 2021-08-12 DIAGNOSIS — M6281 Muscle weakness (generalized): Secondary | ICD-10-CM | POA: Diagnosis not present

## 2021-08-12 DIAGNOSIS — G8250 Quadriplegia, unspecified: Secondary | ICD-10-CM | POA: Diagnosis not present

## 2021-08-13 DIAGNOSIS — G8250 Quadriplegia, unspecified: Secondary | ICD-10-CM | POA: Diagnosis not present

## 2021-08-13 DIAGNOSIS — M6281 Muscle weakness (generalized): Secondary | ICD-10-CM | POA: Diagnosis not present

## 2021-08-14 DIAGNOSIS — M24561 Contracture, right knee: Secondary | ICD-10-CM | POA: Diagnosis not present

## 2021-08-14 DIAGNOSIS — M24562 Contracture, left knee: Secondary | ICD-10-CM | POA: Diagnosis not present

## 2021-08-14 DIAGNOSIS — E785 Hyperlipidemia, unspecified: Secondary | ICD-10-CM | POA: Diagnosis not present

## 2021-08-14 DIAGNOSIS — I10 Essential (primary) hypertension: Secondary | ICD-10-CM | POA: Diagnosis not present

## 2021-08-14 DIAGNOSIS — G8929 Other chronic pain: Secondary | ICD-10-CM | POA: Diagnosis not present

## 2021-08-14 DIAGNOSIS — K219 Gastro-esophageal reflux disease without esophagitis: Secondary | ICD-10-CM | POA: Diagnosis not present

## 2021-08-14 DIAGNOSIS — S32421D Displaced fracture of posterior wall of right acetabulum, subsequent encounter for fracture with routine healing: Secondary | ICD-10-CM | POA: Diagnosis not present

## 2021-08-14 DIAGNOSIS — G8250 Quadriplegia, unspecified: Secondary | ICD-10-CM | POA: Diagnosis not present

## 2021-08-14 DIAGNOSIS — Z96642 Presence of left artificial hip joint: Secondary | ICD-10-CM | POA: Diagnosis not present

## 2021-08-14 DIAGNOSIS — M6281 Muscle weakness (generalized): Secondary | ICD-10-CM | POA: Diagnosis not present

## 2021-08-15 DIAGNOSIS — M6281 Muscle weakness (generalized): Secondary | ICD-10-CM | POA: Diagnosis not present

## 2021-08-15 DIAGNOSIS — G8250 Quadriplegia, unspecified: Secondary | ICD-10-CM | POA: Diagnosis not present

## 2021-08-16 DIAGNOSIS — G8250 Quadriplegia, unspecified: Secondary | ICD-10-CM | POA: Diagnosis not present

## 2021-08-16 DIAGNOSIS — M6281 Muscle weakness (generalized): Secondary | ICD-10-CM | POA: Diagnosis not present

## 2021-08-18 DIAGNOSIS — G8250 Quadriplegia, unspecified: Secondary | ICD-10-CM | POA: Diagnosis not present

## 2021-08-18 DIAGNOSIS — M6281 Muscle weakness (generalized): Secondary | ICD-10-CM | POA: Diagnosis not present

## 2021-08-19 DIAGNOSIS — M6281 Muscle weakness (generalized): Secondary | ICD-10-CM | POA: Diagnosis not present

## 2021-08-19 DIAGNOSIS — G8250 Quadriplegia, unspecified: Secondary | ICD-10-CM | POA: Diagnosis not present

## 2021-08-20 DIAGNOSIS — M6281 Muscle weakness (generalized): Secondary | ICD-10-CM | POA: Diagnosis not present

## 2021-08-20 DIAGNOSIS — G8250 Quadriplegia, unspecified: Secondary | ICD-10-CM | POA: Diagnosis not present

## 2021-08-21 DIAGNOSIS — G8250 Quadriplegia, unspecified: Secondary | ICD-10-CM | POA: Diagnosis not present

## 2021-08-21 DIAGNOSIS — M6281 Muscle weakness (generalized): Secondary | ICD-10-CM | POA: Diagnosis not present

## 2021-08-28 DIAGNOSIS — M25552 Pain in left hip: Secondary | ICD-10-CM | POA: Diagnosis not present

## 2021-08-28 DIAGNOSIS — G894 Chronic pain syndrome: Secondary | ICD-10-CM | POA: Diagnosis not present

## 2021-08-28 DIAGNOSIS — R600 Localized edema: Secondary | ICD-10-CM | POA: Diagnosis not present

## 2021-08-28 DIAGNOSIS — M24562 Contracture, left knee: Secondary | ICD-10-CM | POA: Diagnosis not present

## 2021-08-31 DIAGNOSIS — N3289 Other specified disorders of bladder: Secondary | ICD-10-CM | POA: Diagnosis not present

## 2021-08-31 DIAGNOSIS — R300 Dysuria: Secondary | ICD-10-CM | POA: Diagnosis not present

## 2021-08-31 DIAGNOSIS — Z87440 Personal history of urinary (tract) infections: Secondary | ICD-10-CM | POA: Diagnosis not present

## 2021-09-01 DIAGNOSIS — N390 Urinary tract infection, site not specified: Secondary | ICD-10-CM | POA: Diagnosis not present

## 2021-09-06 DIAGNOSIS — R079 Chest pain, unspecified: Secondary | ICD-10-CM | POA: Diagnosis not present

## 2021-09-07 DIAGNOSIS — R202 Paresthesia of skin: Secondary | ICD-10-CM | POA: Diagnosis not present

## 2021-09-07 DIAGNOSIS — Z87440 Personal history of urinary (tract) infections: Secondary | ICD-10-CM | POA: Diagnosis not present

## 2021-09-07 DIAGNOSIS — G8250 Quadriplegia, unspecified: Secondary | ICD-10-CM | POA: Diagnosis not present

## 2021-09-07 DIAGNOSIS — K219 Gastro-esophageal reflux disease without esophagitis: Secondary | ICD-10-CM | POA: Diagnosis not present

## 2021-09-07 DIAGNOSIS — I10 Essential (primary) hypertension: Secondary | ICD-10-CM | POA: Diagnosis not present

## 2021-09-07 DIAGNOSIS — R300 Dysuria: Secondary | ICD-10-CM | POA: Diagnosis not present

## 2021-09-08 DIAGNOSIS — Z79899 Other long term (current) drug therapy: Secondary | ICD-10-CM | POA: Diagnosis not present

## 2021-09-09 DIAGNOSIS — N3289 Other specified disorders of bladder: Secondary | ICD-10-CM | POA: Diagnosis not present

## 2021-09-09 DIAGNOSIS — R0789 Other chest pain: Secondary | ICD-10-CM | POA: Diagnosis not present

## 2021-09-09 DIAGNOSIS — K219 Gastro-esophageal reflux disease without esophagitis: Secondary | ICD-10-CM | POA: Diagnosis not present

## 2021-09-09 DIAGNOSIS — N390 Urinary tract infection, site not specified: Secondary | ICD-10-CM | POA: Diagnosis not present

## 2021-09-09 DIAGNOSIS — Z87440 Personal history of urinary (tract) infections: Secondary | ICD-10-CM | POA: Diagnosis not present

## 2021-09-09 DIAGNOSIS — N4889 Other specified disorders of penis: Secondary | ICD-10-CM | POA: Diagnosis not present

## 2021-09-09 DIAGNOSIS — R202 Paresthesia of skin: Secondary | ICD-10-CM | POA: Diagnosis not present

## 2021-09-13 DIAGNOSIS — R278 Other lack of coordination: Secondary | ICD-10-CM | POA: Diagnosis not present

## 2021-09-13 DIAGNOSIS — G8250 Quadriplegia, unspecified: Secondary | ICD-10-CM | POA: Diagnosis not present

## 2021-09-14 DIAGNOSIS — R278 Other lack of coordination: Secondary | ICD-10-CM | POA: Diagnosis not present

## 2021-09-14 DIAGNOSIS — G8250 Quadriplegia, unspecified: Secondary | ICD-10-CM | POA: Diagnosis not present

## 2021-09-15 DIAGNOSIS — G8250 Quadriplegia, unspecified: Secondary | ICD-10-CM | POA: Diagnosis not present

## 2021-09-15 DIAGNOSIS — R278 Other lack of coordination: Secondary | ICD-10-CM | POA: Diagnosis not present

## 2021-09-15 NOTE — Progress Notes (Unsigned)
Patient referred by Lajean Manes, MD for bilateral arm pain  Subjective:   Herbert Williams, male    DOB: [birth year], 56 y.o.   MRN: [id number]   Chief Complaint  Patient presents with   Hypertension   Chest Pain    HPI  56 y.o. Caucasian male with h/o brain tumor (choroid plexus papilloma) resection (1978), spasticity, hypertension, hyperlipidemia, smoker, referred for bilateral arm pain  Patient currently lives in [place] [facility].  Patient needs help with all ADLs due to his spasticity from prior brain tumor and resection.  Recently, he noticed pain in both his forearms, and tightness in his hands.  He denies any chest pain.  On further questioning, these pain symptoms seem to occur after pushing his old wheelchair, shortness reportedly hard to push.  His changes recheck 3 days ago which is much better according to him.   Past Medical History:  Diagnosis Date   Anxiety    claustrophobic   Brain tumor ([place]) 1979   no current problems, no deficits   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   Brain tumor - Patient with moderate tremor and ataxia uses walker   GERD (gastroesophageal reflux disease) 09/10/2013   HTN (hypertension) 09/10/2013   Hyperlipemia 09/10/2013     Past Surgical History:  Procedure Laterality Date   BRAIN SURGERY     EYE SURGERY Left    cataracts removed   GIRDLESTONE ARTHROPLASTY Left 05/30/2020   Procedure: GIRDLESTONE ARTHROPLASTY LEFT HIP;  Surgeon: Shona Needles, MD;  Location: [place];  Service: Orthopedics;  Laterality: Left;   KNEE ARTHROSCOPY Left 09/11/2013   Procedure: LEFT ARTHROSCOPY WITH FIXATION OF FEMUR FX.;  Surgeon: Renette Butters, MD;  Location: [place];  Service: Orthopedics;  Laterality: Left;   PERCUTANEOUS PINNING Left 09/11/2013   Procedure: PERCUTANEOUS PINNING EXTREMITY;  Surgeon: Renette Butters, MD;  Location: [place];  Service: Orthopedics;  Laterality: Left;   TOTAL HIP ARTHROPLASTY Left       Social History   Tobacco Use  Smoking Status Former   Packs/day: 0.50   Years: 38.00   Total pack years: 19.00   Types: Cigarettes   Quit date: 02/2020   Years since quitting: 1.6  Smokeless Tobacco Current    Social History   Substance and Sexual Activity  Alcohol Use Not Currently   Comment: daily 3-4 cans beer, None since Jan 2022     History reviewed. No pertinent family history.    Current Outpatient Medications:    aspirin 81 MG tablet, Take 1 tablet (81 mg total) by mouth daily., Disp: 30 tablet, Rfl: 0   atenolol (TENORMIN) 50 MG tablet, Take 1 tablet by mouth daily., Disp: , Rfl:    atorvastatin (LIPITOR) 10 MG tablet, Take 10 mg by mouth daily., Disp: , Rfl:    calcium carbonate (TUMS - DOSED IN MG ELEMENTAL CALCIUM) 500 MG chewable tablet, Chew 500 mg by mouth 3 (three) times daily as needed for indigestion or heartburn. (Patient not taking: Reported on 01/06/2021), Disp: , Rfl:    ceFEPIme (MAXIPIME) 1 g injection, , Disp: , Rfl:    cephALEXin (KEFLEX) 500 MG capsule, Take by mouth., Disp: , Rfl:    CHOLECALCIFEROL PO, Take 1,000 Units by mouth. One a day, Disp: , Rfl:    ciprofloxacin (CIPRO) 500 MG tablet, Take 500 mg by mouth 2 (two) times daily., Disp: , Rfl:    Cyanocobalamin (VITAMIN B12) 1000 MCG TBCR, 2 tablets,  Disp: , Rfl:    docusate sodium (COLACE) 100 MG capsule, Take 1 capsule (100 mg total) by mouth 2 (two) times daily., Disp: 20 capsule, Rfl: 0   esomeprazole (NEXIUM) 20 MG capsule, Take by mouth., Disp: , Rfl:    fenofibrate 160 MG tablet, Take 160 mg by mouth daily., Disp: , Rfl:    ferrous sulfate 325 (65 FE) MG tablet, Take 1 tablet (325 mg total) by mouth 3 (three) times daily with meals., Disp: 30 tablet, Rfl: 0   gabapentin (NEURONTIN) 100 MG capsule, , Disp: , Rfl:    ibuprofen (ADVIL) 200 MG tablet, Take 800 mg by mouth every 8 (eight) hours as needed for moderate pain. (Patient not taking: Reported on 04/02/2021), Disp: , Rfl:     lidocaine (XYLOCAINE) 1 % (with preservative) injection, SMARTSIG:Rectally, Disp: , Rfl:    methocarbamol (ROBAXIN) 500 MG tablet, Take 2 tablets (1,000 mg total) by mouth every 6 (six) hours., Disp: 40 tablet, Rfl: 0   nystatin cream (MYCOSTATIN), Apply topically. (Patient not taking: Reported on 04/02/2021), Disp: , Rfl:    Omega-3 Fatty Acids (FISH OIL) 1000 MG CAPS, one tablet, Disp: , Rfl:    ondansetron (ZOFRAN) 4 MG tablet, Take 4 mg by mouth every 8 (eight) hours as needed., Disp: , Rfl:    oxyCODONE-acetaminophen (PERCOCET) 10-325 MG tablet, Take 1 tablet by mouth every 4 (four) hours as needed for pain., Disp: , Rfl:    polyethylene glycol (MIRALAX / GLYCOLAX) 17 g packet, Take 17 g by mouth daily., Disp: , Rfl:    tamsulosin (FLOMAX) 0.4 MG CAPS capsule, Take 1 capsule by mouth daily., Disp: , Rfl:    tolterodine (DETROL LA) 4 MG 24 hr capsule, Take 4 mg by mouth daily., Disp: , Rfl:    Vitamin D, Ergocalciferol, (DRISDOL) 1.25 MG (50000 UNIT) CAPS capsule, Take 1 capsule by mouth once a week., Disp: , Rfl:    zinc oxide (BALMEX) 11.3 % CREA cream, Apply 1 application topically 2 (two) times daily., Disp: , Rfl:    Cardiovascular and other pertinent studies:  Reviewed external labs and tests, independently interpreted  EKG 09/16/2021: Sinus rhythm 64 bpm  LAFB Nonspecific T-abnormality  Recent labs: 11/21/2020: Glucose 88, BUN/Cr 10.7/0.75. EGFR >90. Na/K 142/5.1. Rest of the CMP normal H/H 13.3/40.3. MCV 91.1. Platelets 221 HbA1C 5.7% Chol 195, TG 170, HDL 39, LDL 127 TSH 2.1 normal   Review of Systems  Cardiovascular:  Negative for chest pain, dyspnea on exertion, leg swelling, palpitations and syncope.  Musculoskeletal:        Arm pain          Vitals:   09/16/21 1257  BP: (!) 95/57  Pulse: 64  Resp: 16  Temp: 98 F (36.7 C)  SpO2: 96%    Objective:   Physical Exam Vitals and nursing note reviewed.  Constitutional:      General: He is not in acute  distress.    Comments: On wheelchair due to spasticity  Neck:     Vascular: No JVD.  Cardiovascular:     Rate and Rhythm: Normal rate and regular rhythm.     Heart sounds: Normal heart sounds. No murmur heard. Pulmonary:     Effort: Pulmonary effort is normal.     Breath sounds: Normal breath sounds. No wheezing or rales.        Visit diagnoses:   ICD-10-CM   1. Pain in both upper extremities  M79.601 EKG 12-Lead   M79.602  Orders Placed This Encounter  Procedures   EKG 12-Lead    Assessment & Recommendations:   56 y.o. Caucasian male with h/o brain tumor (choroid plexus papilloma) resection (1978), spasticity, hypertension, hyperlipidemia, smoker, referred for bilateral arm pain  Bilateral arm pain: I believe his bilateral arm pain is musculoskeletal owing to pushing his old wheelchair. I do nit think these syptoms are anginal in nature. I do not think he needs any further cardiac workup.  Mixed hyperlipidemia: Consider increasing Lipitor to 20 mg for better lipid control.   Nicotine dependence: Tobacco cessation counseling:  - Currently smoking 1/4 packs/day   - Patient was informed of the dangers of tobacco abuse including stroke, cancer, and MI, as well as benefits of tobacco cessation. - Patient is not willing to quit at this time. - Approximately 5 mins were spent counseling patient cessation techniques.    Thank you for referring the patient to [place]. Please feel free to contact with any questions.   Nigel Mormon, MD Pager: [phone] Office: [phone]

## 2021-09-16 ENCOUNTER — Ambulatory Visit: Payer: Medicare HMO | Admitting: Cardiology

## 2021-09-16 ENCOUNTER — Encounter: Payer: Self-pay | Admitting: Cardiology

## 2021-09-16 VITALS — BP 95/57 | HR 64 | Temp 98.0°F | Resp 16

## 2021-09-16 DIAGNOSIS — I10 Essential (primary) hypertension: Secondary | ICD-10-CM | POA: Diagnosis not present

## 2021-09-16 DIAGNOSIS — M79601 Pain in right arm: Secondary | ICD-10-CM | POA: Diagnosis not present

## 2021-09-16 DIAGNOSIS — R278 Other lack of coordination: Secondary | ICD-10-CM | POA: Diagnosis not present

## 2021-09-16 DIAGNOSIS — G8250 Quadriplegia, unspecified: Secondary | ICD-10-CM | POA: Diagnosis not present

## 2021-09-16 DIAGNOSIS — E782 Mixed hyperlipidemia: Secondary | ICD-10-CM | POA: Diagnosis not present

## 2021-09-16 DIAGNOSIS — F17210 Nicotine dependence, cigarettes, uncomplicated: Secondary | ICD-10-CM

## 2021-09-16 DIAGNOSIS — M79602 Pain in left arm: Secondary | ICD-10-CM | POA: Diagnosis not present

## 2021-09-16 NOTE — Addendum Note (Signed)
Addended by: Nigel Mormon on: 09/16/2021 01:59 PM   Modules accepted: Level of Service

## 2021-09-18 DIAGNOSIS — G8250 Quadriplegia, unspecified: Secondary | ICD-10-CM | POA: Diagnosis not present

## 2021-09-18 DIAGNOSIS — K219 Gastro-esophageal reflux disease without esophagitis: Secondary | ICD-10-CM | POA: Diagnosis not present

## 2021-09-18 DIAGNOSIS — R278 Other lack of coordination: Secondary | ICD-10-CM | POA: Diagnosis not present

## 2021-09-21 DIAGNOSIS — G8250 Quadriplegia, unspecified: Secondary | ICD-10-CM | POA: Diagnosis not present

## 2021-09-21 DIAGNOSIS — R278 Other lack of coordination: Secondary | ICD-10-CM | POA: Diagnosis not present

## 2021-09-22 DIAGNOSIS — R278 Other lack of coordination: Secondary | ICD-10-CM | POA: Diagnosis not present

## 2021-09-22 DIAGNOSIS — G8250 Quadriplegia, unspecified: Secondary | ICD-10-CM | POA: Diagnosis not present

## 2021-09-23 DIAGNOSIS — G8250 Quadriplegia, unspecified: Secondary | ICD-10-CM | POA: Diagnosis not present

## 2021-09-23 DIAGNOSIS — R278 Other lack of coordination: Secondary | ICD-10-CM | POA: Diagnosis not present

## 2021-09-24 DIAGNOSIS — R278 Other lack of coordination: Secondary | ICD-10-CM | POA: Diagnosis not present

## 2021-09-24 DIAGNOSIS — G8250 Quadriplegia, unspecified: Secondary | ICD-10-CM | POA: Diagnosis not present

## 2021-09-25 DIAGNOSIS — R278 Other lack of coordination: Secondary | ICD-10-CM | POA: Diagnosis not present

## 2021-09-25 DIAGNOSIS — G8250 Quadriplegia, unspecified: Secondary | ICD-10-CM | POA: Diagnosis not present

## 2021-09-28 DIAGNOSIS — G8250 Quadriplegia, unspecified: Secondary | ICD-10-CM | POA: Diagnosis not present

## 2021-09-28 DIAGNOSIS — R278 Other lack of coordination: Secondary | ICD-10-CM | POA: Diagnosis not present

## 2021-09-29 DIAGNOSIS — G8250 Quadriplegia, unspecified: Secondary | ICD-10-CM | POA: Diagnosis not present

## 2021-09-29 DIAGNOSIS — R278 Other lack of coordination: Secondary | ICD-10-CM | POA: Diagnosis not present

## 2021-09-30 DIAGNOSIS — R278 Other lack of coordination: Secondary | ICD-10-CM | POA: Diagnosis not present

## 2021-09-30 DIAGNOSIS — G8250 Quadriplegia, unspecified: Secondary | ICD-10-CM | POA: Diagnosis not present

## 2021-10-01 ENCOUNTER — Encounter: Payer: Self-pay | Admitting: *Deleted

## 2021-10-01 DIAGNOSIS — G8250 Quadriplegia, unspecified: Secondary | ICD-10-CM | POA: Diagnosis not present

## 2021-10-01 DIAGNOSIS — R278 Other lack of coordination: Secondary | ICD-10-CM | POA: Diagnosis not present

## 2021-10-02 DIAGNOSIS — R278 Other lack of coordination: Secondary | ICD-10-CM | POA: Diagnosis not present

## 2021-10-02 DIAGNOSIS — G8250 Quadriplegia, unspecified: Secondary | ICD-10-CM | POA: Diagnosis not present

## 2021-10-05 ENCOUNTER — Ambulatory Visit (INDEPENDENT_AMBULATORY_CARE_PROVIDER_SITE_OTHER): Payer: Medicare HMO | Admitting: Diagnostic Neuroimaging

## 2021-10-05 ENCOUNTER — Encounter: Payer: Self-pay | Admitting: Diagnostic Neuroimaging

## 2021-10-05 VITALS — BP 110/67 | HR 58

## 2021-10-05 DIAGNOSIS — R200 Anesthesia of skin: Secondary | ICD-10-CM

## 2021-10-05 DIAGNOSIS — M542 Cervicalgia: Secondary | ICD-10-CM

## 2021-10-05 DIAGNOSIS — M2042 Other hammer toe(s) (acquired), left foot: Secondary | ICD-10-CM | POA: Diagnosis not present

## 2021-10-05 DIAGNOSIS — Z79899 Other long term (current) drug therapy: Secondary | ICD-10-CM | POA: Diagnosis not present

## 2021-10-05 DIAGNOSIS — I739 Peripheral vascular disease, unspecified: Secondary | ICD-10-CM | POA: Diagnosis not present

## 2021-10-05 DIAGNOSIS — B351 Tinea unguium: Secondary | ICD-10-CM | POA: Diagnosis not present

## 2021-10-05 DIAGNOSIS — M2041 Other hammer toe(s) (acquired), right foot: Secondary | ICD-10-CM | POA: Diagnosis not present

## 2021-10-05 NOTE — Progress Notes (Signed)
[facility]  PATIENT: Herbert Williams DOB: 09/21/1965  REFERRING CLINICIAN: Miller, [place], FNP HISTORY FROM: patient  REASON FOR VISIT: new consult    HISTORICAL  CHIEF COMPLAINT:  Chief Complaint  Patient presents with   New Patient (Initial Visit)    He states his hands are always tight and he states he is feeling a tingling sensation that is sometimes up his arms as well. He reports a couple weeks ago he states he felt the tingling sensation up his arms and across his chest. Room 6 alone    HISTORY OF PRESENT ILLNESS:   56 year old male here for evaluation of numbness and tingling in arms.  Symptoms have been present for at least last 2 years.  He describes numbness and tingling in his left greater than right hand.  Has some numbness in his feet.  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   REVIEW OF SYSTEMS: Full 14 system review of systems performed and negative with exception of: as per HPI.   ALLERGIES: Allergies  Allergen Reactions   Tape    Pedi-Pre Tape Spray [Wound Dressing Adhesive] Rash   Penicillins Rash and Other (See Comments)    Tolerated cefazolin peri-operatively on 05/30/20    HOME MEDICATIONS: Outpatient Medications Prior to Visit  Medication Sig Dispense Refill   aspirin 81 MG tablet Take 1 tablet (81 mg total) by mouth daily. 30 tablet 0   atenolol (TENORMIN) 50 MG tablet Take 1 tablet by mouth daily.     atorvastatin (LIPITOR) 10 MG tablet Take 10 mg by mouth daily.     calcium carbonate (TUMS - DOSED IN MG ELEMENTAL CALCIUM) 500 MG chewable tablet Chew 500 mg by mouth 3 (three) times daily as needed for indigestion or heartburn.     CHOLECALCIFEROL PO Take 1,000 Units by mouth. One a day     famotidine (PEPCID) 20 MG tablet Take 20 mg by mouth 2 (two) times daily.     fenofibrate 160 MG tablet Take 160 mg by mouth  daily.     ferrous sulfate 325 (65 FE) MG tablet Take 1 tablet (325 mg total) by mouth 3 (three) times daily with meals. 30 tablet 0   gabapentin (NEURONTIN) 100 MG capsule      methocarbamol (ROBAXIN) 500 MG tablet Take 2 tablets (1,000 mg total) by mouth every 6 (six) hours. 40 tablet 0   oxyCODONE-acetaminophen (PERCOCET) 10-325 MG tablet Take 1 tablet by mouth every 4 (four) hours as needed for pain.     senna-docusate (SENOKOT-S) 8.6-50 MG tablet Take 1 tablet by mouth daily.     tamsulosin (FLOMAX) 0.4 MG CAPS capsule Take 1 capsule by mouth daily.     tolterodine (DETROL LA) 4 MG 24 hr capsule Take 4 mg by mouth daily.     Vitamin D, Ergocalciferol, (DRISDOL) 1.25 MG (50000 UNIT) CAPS capsule Take 1 capsule by mouth once a week.     zinc oxide (BALMEX) 11.3 % CREA cream Apply 1 application topically 2 (two) times daily.     No facility-administered medications prior to visit.    PAST MEDICAL HISTORY: Past Medical History:  Diagnosis Date   Anxiety    claustrophobic   Brain tumor ([place]) 1979   no current problems, no deficits   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   Brain tumor - Patient with moderate tremor and ataxia uses walker  GERD (gastroesophageal reflux disease) 09/10/2013   HTN (hypertension) 09/10/2013   Hyperlipemia 09/10/2013   Paresthesia     PAST SURGICAL HISTORY: Past Surgical History:  Procedure Laterality Date   BRAIN SURGERY     EYE SURGERY Left    cataracts removed   GIRDLESTONE ARTHROPLASTY Left 05/30/2020   Procedure: GIRDLESTONE ARTHROPLASTY LEFT HIP;  Surgeon: Shona Needles, MD;  Location: [place];  Service: Orthopedics;  Laterality: Left;   KNEE ARTHROSCOPY Left 09/11/2013   Procedure: LEFT ARTHROSCOPY WITH FIXATION OF FEMUR FX.;  Surgeon: Renette Butters, MD;  Location: [place];  Service: Orthopedics;  Laterality: Left;   PERCUTANEOUS PINNING Left 09/11/2013   Procedure: PERCUTANEOUS PINNING EXTREMITY;  Surgeon: Renette Butters, MD;   Location: [place];  Service: Orthopedics;  Laterality: Left;   TOTAL HIP ARTHROPLASTY Left     FAMILY HISTORY: No family history on file.  SOCIAL HISTORY: Social History   Socioeconomic History   Marital status: Single    Spouse name: Not on file   Number of children: 0   Years of education: Not on file   Highest education level: Not on file  Occupational History   Not on file  Tobacco Use   Smoking status: Former    Packs/day: 0.50    Years: 38.00    Total pack years: 19.00    Types: Cigarettes    Quit date: 02/2020    Years since quitting: 1.6   Smokeless tobacco: Current  Vaping Use   Vaping Use: Never used  Substance and Sexual Activity   Alcohol use: Not Currently    Comment: daily 3-4 cans beer, None since Jan 2022   Drug use: No   Sexual activity: Not on file  Other Topics Concern   Not on file  Social History Narrative   10/01/21 resides at [place] Strain: Not on file  Food Insecurity: Not on file  Transportation Needs: Not on file  Physical Activity: Not on file  Stress: Not on file  Social Connections: Not on file  Intimate Partner Violence: Not on file     PHYSICAL EXAM  GENERAL EXAM/CONSTITUTIONAL: Vitals:  Vitals:   10/05/21 1045  BP: 110/67  Pulse: (!) 58   There is no height or weight on file to calculate BMI. Wt Readings from Last 3 Encounters:  11/06/20 179 lb 14.4 oz (81.6 kg)  05/30/20 179 lb 14.3 oz (81.6 kg)  05/28/20 179 lb 14.3 oz (81.6 kg)   Patient is in no distress; well developed, nourished and groomed; neck is supple  CARDIOVASCULAR: Examination of carotid arteries is normal; no carotid bruits Regular rate and rhythm, no murmurs Examination of peripheral vascular system by observation and palpation is normal  EYES: Ophthalmoscopic exam of optic discs and posterior segments is normal; no papilledema or hemorrhages No results  found.  MUSCULOSKELETAL: Gait, strength, tone, movements noted in Neurologic exam below  NEUROLOGIC: MENTAL STATUS:      No data to display         awake, alert, oriented to person, place and time recent and remote memory intact normal attention and concentration language fluent, comprehension intact, naming intact fund of knowledge appropriate  CRANIAL NERVE:  2nd - no papilledema on fundoscopic exam 2nd, 3rd, 4th, 6th - pupils equal and reactive to light, visual fields full to confrontation, extraocular muscles --> ESOTROPIA, no nystagmus 5th - facial sensation symmetric 7th - facial strength -->  DECR RIGHT UPPER AND LOWER FACIAL WEAKNESS 8th - hearing intact 9th - palate elevates symmetrically, uvula midline 11th - shoulder shrug symmetric 12th - tongue protrusion midline MILD DYARTHRIA  MOTOR:  ATROPHY AND WEAKNESS IN THENAR AND HYPOTHENAR MUSCLES OF HANDS BUE PROX 4+; DISTAL 3-4 BLE INCR TONE; PROX 3-4; DISTAL 4  SENSORY:  normal and symmetric to light touch, pinprick, temperature, vibration; EXCEPT DECR IN ANKLES / FEET TO PP  COORDINATION:  finger-nose-finger, fine finger movements --> DYSMETRIA IN BUE (LEFT WORSE THAN RIGHT)  REFLEXES:  deep tendon reflexes 2+ IN BUE; 1+ IN BLE and symmetric  GAIT/STATION:  IN WHEELCHAIR      DIAGNOSTIC DATA (LABS, IMAGING, TESTING) - I reviewed patient records, labs, notes, testing and imaging myself where available.  Lab Results  Component Value Date   WBC 7.8 06/06/2020   HGB 8.4 (L) 06/06/2020   HCT 26.8 (L) 06/06/2020   MCV 94.7 06/06/2020   PLT 338 06/06/2020      Component Value Date/Time   NA 137 06/05/2020 1144   K 3.7 06/05/2020 1144   CL 101 06/05/2020 1144   CO2 28 06/05/2020 1144   GLUCOSE 89 06/05/2020 1144   BUN 5 (L) 06/05/2020 1144   CREATININE 0.55 (L) 06/05/2020 1144   CALCIUM 8.5 (L) 06/05/2020 1144   PROT 7.3 06/14/2007 1322   ALBUMIN 4.1 06/14/2007 1322   AST 33 06/14/2007 1322    ALT 43 06/14/2007 1322   ALKPHOS 57 06/14/2007 1322   BILITOT 0.7 06/14/2007 1322   GFRNONAA >60 06/05/2020 1144   GFRAA >90 09/11/2013 0549   Lab Results  Component Value Date   CHOL 161 09/11/2013   HDL 63 09/11/2013   LDLCALC 73 09/11/2013   TRIG 123 09/11/2013   CHOLHDL 2.6 09/11/2013   No results found for: "HGBA1C" No results found for: "VITAMINB12" No results found for: "TSH"   09/18/20 CT head  1. No evidence of acute intracranial abnormality. 2. Ventriculostomy catheter in place without hydrocephalus. 3. 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    ASSESSMENT AND PLAN  56 y.o. year old male here with:   Dx:  1. Numbness in both hands   2. 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    Penni Bombard, MD 0/62/6948, 54:62 AM Certified in Neurology, Neurophysiology and Neuroimaging  [facility] [address] [place],  [address] [phone]

## 2021-10-05 NOTE — Patient Instructions (Signed)
  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

## 2021-10-06 DIAGNOSIS — R278 Other lack of coordination: Secondary | ICD-10-CM | POA: Diagnosis not present

## 2021-10-06 DIAGNOSIS — G8250 Quadriplegia, unspecified: Secondary | ICD-10-CM | POA: Diagnosis not present

## 2021-10-06 LAB — CBC WITH DIFFERENTIAL/PLATELET
Basophils Absolute: 0 10*3/uL (ref 0.0–0.2)
Basos: 0 %
EOS (ABSOLUTE): 0.3 10*3/uL (ref 0.0–0.4)
Eos: 3 %
Hematocrit: 42.6 % (ref 37.5–51.0)
Hemoglobin: 13.8 g/dL (ref 13.0–17.7)
Immature Grans (Abs): 0.1 10*3/uL (ref 0.0–0.1)
Immature Granulocytes: 1 %
Lymphocytes Absolute: 1.8 10*3/uL (ref 0.7–3.1)
Lymphs: 18 %
MCH: 29.6 pg (ref 26.6–33.0)
MCHC: 32.4 g/dL (ref 31.5–35.7)
MCV: 91 fL (ref 79–97)
Monocytes Absolute: 0.7 10*3/uL (ref 0.1–0.9)
Monocytes: 6 %
Neutrophils Absolute: 7.3 10*3/uL — ABNORMAL HIGH (ref 1.4–7.0)
Neutrophils: 72 %
Platelets: 240 10*3/uL (ref 150–450)
RBC: 4.66 x10E6/uL (ref 4.14–5.80)
RDW: 13.6 % (ref 11.6–15.4)
WBC: 10.2 10*3/uL (ref 3.4–10.8)

## 2021-10-06 LAB — COMPREHENSIVE METABOLIC PANEL
ALT: 13 IU/L (ref 0–44)
AST: 12 IU/L (ref 0–40)
Albumin/Globulin Ratio: 1.6 (ref 1.2–2.2)
Albumin: 4.5 g/dL (ref 3.8–4.9)
Alkaline Phosphatase: 51 IU/L (ref 44–121)
BUN/Creatinine Ratio: 16 (ref 9–20)
BUN: 12 mg/dL (ref 6–24)
Bilirubin Total: 0.3 mg/dL (ref 0.0–1.2)
CO2: 25 mmol/L (ref 20–29)
Calcium: 10.1 mg/dL (ref 8.7–10.2)
Chloride: 101 mmol/L (ref 96–106)
Creatinine, Ser: 0.77 mg/dL (ref 0.76–1.27)
Globulin, Total: 2.8 g/dL (ref 1.5–4.5)
Glucose: 96 mg/dL (ref 70–99)
Potassium: 4.5 mmol/L (ref 3.5–5.2)
Sodium: 141 mmol/L (ref 134–144)
Total Protein: 7.3 g/dL (ref 6.0–8.5)
eGFR: 106 mL/min/{1.73_m2} (ref >59)

## 2021-10-06 LAB — TSH: TSH: 0.902 u[IU]/mL (ref 0.450–4.500)

## 2021-10-06 LAB — HEMOGLOBIN A1C
Est. average glucose Bld gHb Est-mCnc: 126 mg/dL
Hgb A1c MFr Bld: 6 % — ABNORMAL HIGH (ref 4.8–5.6)

## 2021-10-06 LAB — VITAMIN B12: Vitamin B-12: 681 pg/mL (ref 232–1245)

## 2021-10-07 DIAGNOSIS — G8250 Quadriplegia, unspecified: Secondary | ICD-10-CM | POA: Diagnosis not present

## 2021-10-07 DIAGNOSIS — R278 Other lack of coordination: Secondary | ICD-10-CM | POA: Diagnosis not present

## 2021-10-08 DIAGNOSIS — R278 Other lack of coordination: Secondary | ICD-10-CM | POA: Diagnosis not present

## 2021-10-08 DIAGNOSIS — G8250 Quadriplegia, unspecified: Secondary | ICD-10-CM | POA: Diagnosis not present

## 2021-10-09 DIAGNOSIS — R278 Other lack of coordination: Secondary | ICD-10-CM | POA: Diagnosis not present

## 2021-10-09 DIAGNOSIS — H9193 Unspecified hearing loss, bilateral: Secondary | ICD-10-CM | POA: Diagnosis not present

## 2021-10-09 DIAGNOSIS — G8250 Quadriplegia, unspecified: Secondary | ICD-10-CM | POA: Diagnosis not present

## 2021-10-10 DIAGNOSIS — R278 Other lack of coordination: Secondary | ICD-10-CM | POA: Diagnosis not present

## 2021-10-10 DIAGNOSIS — G8250 Quadriplegia, unspecified: Secondary | ICD-10-CM | POA: Diagnosis not present

## 2021-10-16 ENCOUNTER — Telehealth: Payer: Self-pay | Admitting: Diagnostic Neuroimaging

## 2021-10-16 DIAGNOSIS — R300 Dysuria: Secondary | ICD-10-CM | POA: Diagnosis not present

## 2021-10-16 NOTE — Telephone Encounter (Signed)
Mcarthur Rossetti Josem Kaufmann: [phone] exp. 10/16/21-11/15/21 sent to GI

## 2021-10-19 DIAGNOSIS — N390 Urinary tract infection, site not specified: Secondary | ICD-10-CM | POA: Diagnosis not present

## 2021-10-19 DIAGNOSIS — Z7689 Persons encountering health services in other specified circumstances: Secondary | ICD-10-CM | POA: Diagnosis not present

## 2021-10-19 DIAGNOSIS — R300 Dysuria: Secondary | ICD-10-CM | POA: Diagnosis not present

## 2021-10-21 DIAGNOSIS — K219 Gastro-esophageal reflux disease without esophagitis: Secondary | ICD-10-CM | POA: Diagnosis not present

## 2021-10-21 DIAGNOSIS — N390 Urinary tract infection, site not specified: Secondary | ICD-10-CM | POA: Diagnosis not present

## 2021-10-23 ENCOUNTER — Ambulatory Visit (INDEPENDENT_AMBULATORY_CARE_PROVIDER_SITE_OTHER): Payer: Medicare HMO | Admitting: Gastroenterology

## 2021-10-23 ENCOUNTER — Encounter: Payer: Self-pay | Admitting: Gastroenterology

## 2021-10-23 VITALS — BP 112/60 | HR 78

## 2021-10-23 DIAGNOSIS — K219 Gastro-esophageal reflux disease without esophagitis: Secondary | ICD-10-CM

## 2021-10-23 DIAGNOSIS — Z1212 Encounter for screening for malignant neoplasm of rectum: Secondary | ICD-10-CM

## 2021-10-23 DIAGNOSIS — Z1211 Encounter for screening for malignant neoplasm of colon: Secondary | ICD-10-CM

## 2021-10-23 NOTE — Progress Notes (Unsigned)
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 Lajean Manes for further evaluation of typical GERD symptoms.  The patient states he has had problems with heartburn for many years.  He previously had been taking Prevacid which worked very well for him.  When he moved in to his new care home he was switched over to Nexium, which does not work as well.  He also used to take Tums, but states he is no longer able to to take this at his care home.  It appears he is prescribed famotidine in the evenings, but the patient was not aware of this medication. He takes Nexium 20 mg every day first thing in the morning. He experiences symptoms of burning sensation in the chest and throat as well as a "queasy" stomach on a near daily basis symptoms are noticed primarily in the evenings, when he is lying down.  His symptoms are worse when he eats certain foods, such as tacos, jalapenos, tomato juice and spicy foods.  He sometimes is awoken from sleep by his reflux symptoms. He denies any dysphagia.  His weight is stable.  He denies problems with nausea or vomiting.  No significant abdominal pain. He drinks coffee in the morning and he smokes about 4 cigarettes/day.  He does not drink alcohol. He has never undergone any colon cancer screening.  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   GERD (gastroesophageal reflux disease) 09/10/2013   HTN (hypertension) 09/10/2013   Hyperlipemia 09/10/2013   Paresthesia      Past Surgical History:  Procedure Laterality Date   BRAIN SURGERY     EYE SURGERY Left    cataracts removed    GIRDLESTONE ARTHROPLASTY Left 05/30/2020   Procedure: GIRDLESTONE ARTHROPLASTY LEFT HIP;  Surgeon: Shona Needles, MD;  Location: [facility];  Service: Orthopedics;  Laterality: Left;   KNEE ARTHROSCOPY Left 09/11/2013   Procedure: LEFT ARTHROSCOPY WITH FIXATION OF FEMUR FX.;  Surgeon: Renette Butters, MD;  Location: [facility];  Service: Orthopedics;  Laterality: Left;   PERCUTANEOUS PINNING Left 09/11/2013   Procedure: PERCUTANEOUS PINNING EXTREMITY;  Surgeon: Renette Butters, MD;  Location: [facility];  Service: Orthopedics;  Laterality: Left;   TOTAL HIP ARTHROPLASTY Left    History reviewed. No pertinent family history. Social History   Tobacco Use   Smoking status: Every Day    Packs/day: 0.50    Years: 38.00    Total pack years: 19.00    Types: Cigarettes    Last attempt to quit: 02/2020    Years since quitting: 1.7   Smokeless tobacco: Former  Scientific laboratory technician Use: Never used  Substance Use Topics   Alcohol use: Not Currently    Comment: daily 3-4 cans beer, None since Jan 2022   Drug use: No   Current Outpatient Medications  Medication Sig Dispense Refill   aspirin 81 MG tablet Take 1 tablet (81 mg total) by mouth daily. 30 tablet 0   atenolol (TENORMIN) 50 MG tablet Take 1 tablet by mouth daily.     atorvastatin (LIPITOR) 10 MG tablet Take 10  mg by mouth daily.     calcium carbonate (TUMS - DOSED IN MG ELEMENTAL CALCIUM) 500 MG chewable tablet Chew 500 mg by mouth 3 (three) times daily as needed for indigestion or heartburn.     CHOLECALCIFEROL PO Take 1,000 Units by mouth. One a day     ciprofloxacin (CIPRO) 500 MG tablet Take 500 mg by mouth 2 (two) times daily.     esomeprazole (NEXIUM) 20 MG capsule Take 20 mg by mouth daily at 12 noon.     famotidine (PEPCID) 20 MG tablet Take 20 mg by mouth 2 (two) times daily.     fenofibrate 160 MG tablet Take 160 mg by mouth daily.     ferrous sulfate 325 (65 FE) MG tablet Take 1 tablet (325 mg total) by mouth 3 (three) times daily with  meals. 30 tablet 0   gabapentin (NEURONTIN) 100 MG capsule      methocarbamol (ROBAXIN) 500 MG tablet Take 2 tablets (1,000 mg total) by mouth every 6 (six) hours. 40 tablet 0   oxyCODONE-acetaminophen (PERCOCET) 10-325 MG tablet Take 1 tablet by mouth every 4 (four) hours as needed for pain.     senna-docusate (SENOKOT-S) 8.6-50 MG tablet Take 1 tablet by mouth daily.     tamsulosin (FLOMAX) 0.4 MG CAPS capsule Take 1 capsule by mouth daily.     tolterodine (DETROL LA) 4 MG 24 hr capsule Take 4 mg by mouth daily.     Vitamin D, Ergocalciferol, (DRISDOL) 1.25 MG (50000 UNIT) CAPS capsule Take 1 capsule by mouth once a week.     zinc oxide (BALMEX) 11.3 % CREA cream Apply 1 application topically 2 (two) times daily.     No current facility-administered medications for this visit.   Allergies  Allergen Reactions   Tape    Pedi-Pre Tape Spray [Wound Dressing Adhesive] Rash   Penicillins Rash and Other (See Comments)    Tolerated cefazolin peri-operatively on 05/30/20     Review of Systems: All systems reviewed and negative except where noted in HPI.    No results found.  Physical Exam: BP 112/60   Pulse 78  Constitutional: Pleasant,well-developed, Caucasian male in no acute distress.  Seated in wheelchair HEENT: Normocephalic and atraumatic. Conjunctivae are normal. No scleral icterus.  Esotropia Cardiovascular: Normal rate, regular rhythm.  Pulmonary/chest: Effort normal and breath sounds normal. No wheezing, rales or rhonchi. Abdominal: Soft, nondistended, nontender. Bowel sounds active throughout. There are no masses palpable. No hepatomegaly. Extremities: no edema Neurological: Alert and oriented to person place and time.  Speech is slowed and slightly dysarthric Skin: Skin is warm and dry. No rashes noted.  Surgical scars over scalp Psychiatric: Normal mood and affect. Behavior is normal.  CBC    Component Value Date/Time   WBC 10.2 10/05/2021 1143   WBC 7.8 06/06/2020  1245   RBC 4.66 10/05/2021 1143   RBC 2.83 (L) 06/06/2020 1245   HGB 13.8 10/05/2021 1143   HCT 42.6 10/05/2021 1143   PLT 240 10/05/2021 1143   MCV 91 10/05/2021 1143   MCH 29.6 10/05/2021 1143   MCH 29.7 06/06/2020 1245   MCHC 32.4 10/05/2021 1143   MCHC 31.3 06/06/2020 1245   RDW 13.6 10/05/2021 1143   LYMPHSABS 1.8 10/05/2021 1143   MONOABS 0.8 06/14/2007 1322   EOSABS 0.3 10/05/2021 1143   BASOSABS 0.0 10/05/2021 1143    CMP     Component Value Date/Time   NA 141 10/05/2021 1143   K 4.5 10/05/2021  1143   CL 101 10/05/2021 1143   CO2 25 10/05/2021 1143   GLUCOSE 96 10/05/2021 1143   GLUCOSE 89 06/05/2020 1144   BUN 12 10/05/2021 1143   CREATININE 0.77 10/05/2021 1143   CALCIUM 10.1 10/05/2021 1143   PROT 7.3 10/05/2021 1143   ALBUMIN 4.5 10/05/2021 1143   AST 12 10/05/2021 1143   ALT 13 10/05/2021 1143   ALKPHOS 51 10/05/2021 1143   BILITOT 0.3 10/05/2021 1143   GFRNONAA >60 06/05/2020 1144   GFRAA >90 09/11/2013 0549     ASSESSMENT AND PLAN: 56 year old male with longstanding typical GERD symptoms, suboptimally controlled with low-dose Nexium.We discussed the pathophysiology of GERD and the principles of GERD management to include lifestyle modifications  such as dietary discretion (avoidance of alcohol, tobacco, caffeinated and carbonated beverages, spicy/greasy foods, citrus, peppermint/chocolate), weight loss if applicable, head of bed elevation andconsuming last meal of day within 3 hours of bedtime; pharmacologic options to include PPIs, H2RAs and OTC antacids; and finally surgical or endoscopic fundoplication. I recommended the patient start by increasing his Nexium dose to 40 mg in the morning, and to use famotidine as needed in the evenings.  He will be more cognizant of avoiding meals within 3 to 4 hours of bedtime.  His reflux may also improve if he is able to stop smoking completely.  The patient has no red flag symptoms that would warrant a diagnostic  endoscopy. We also discussed colon cancer screening.  A bowel prep would be very difficult for the patient given his mobilize status.  I suggested a stool based test as an alternative and he was agreeable to this.  He is aware that if his Cologuard is positive, he will need to proceed with a colonoscopy.  Given his wheelchair-bound status, this would need to be done in the hospital if needed.   GERD -Increase Nexium to 40 mg PO daily - Famotidine as needed at bedtime -Avoid eating within 3 hours of bedtime -Elevated HOB to 30 deg -Smoking cessation -No red flag symptoms to warrant endoscopy  CRC screening - Cologuard  Blythe Veach E. Candis Schatz, MD [facility]  CC:  Lajean Manes, MD

## 2021-10-23 NOTE — Patient Instructions (Signed)
If you are age 55 or older, your body mass index should be between 23-30. Your There is no height or weight on file to calculate BMI. If this is out of the aforementioned range listed, please consider follow up with your Primary Care Provider.  If you are age 36 or younger, your body mass index should be between 19-25. Your There is no height or weight on file to calculate BMI. If this is out of the aformentioned range listed, please consider follow up with your Primary Care Provider.   Increase Esomeprazole to 40 mg daily.  Dietary [place] modifications for GERD.  Your provider has ordered Cologuard testing as an option for colon cancer screening. This is performed by [employer] [facility] and may be out of network with your insurance. PRIOR to completing the test, it is YOUR responsibility to contact your insurance about covered benefits for this test. Your out of pocket expense could be anywhere from $0.00 to $649.00.   When you call to check coverage with your insurer, please provide the following information:   -The ONLY provider of Cologuard is [place] code for Cologuard is [phone].  [employer] NPI # [id number]  -[employer] Tax ID # [id number]   We have already sent your demographic and insurance information to [employer] [facility] (phone number [phone]) and they should contact you within the next week regarding your test. If you have not heard from them within the next week, please call our office at [phone].   The [facility] providers would like to encourage you to use [facility] to communicate with providers for non-urgent requests or questions.  Due to long hold times on the telephone, sending your provider a message by [facility] may be a faster and more efficient way to get a response.  Please allow 48 business hours for a response.  Please remember that this is for non-urgent requests.   It was a pleasure to see you  today!  Thank you for trusting me with your gastrointestinal care!

## 2021-10-26 ENCOUNTER — Telehealth: Payer: Self-pay

## 2021-10-26 NOTE — Telephone Encounter (Signed)
Contacted pt mother, informed her of labs. She verbally understood and was appreciative. Number provided to CB with questions.

## 2021-10-26 NOTE — Telephone Encounter (Signed)
-----   Message from Penni Bombard, MD sent at 10/25/2021  3:32 PM EDT ----- Persistent anemia. Other labs ok. Follow up with PCP. -VRP

## 2021-10-28 DIAGNOSIS — R1110 Vomiting, unspecified: Secondary | ICD-10-CM | POA: Diagnosis not present

## 2021-10-28 DIAGNOSIS — K219 Gastro-esophageal reflux disease without esophagitis: Secondary | ICD-10-CM | POA: Diagnosis not present

## 2021-10-28 DIAGNOSIS — K5903 Drug induced constipation: Secondary | ICD-10-CM | POA: Diagnosis not present

## 2021-10-28 DIAGNOSIS — G894 Chronic pain syndrome: Secondary | ICD-10-CM | POA: Diagnosis not present

## 2021-10-29 DIAGNOSIS — E785 Hyperlipidemia, unspecified: Secondary | ICD-10-CM | POA: Diagnosis not present

## 2021-10-29 DIAGNOSIS — M24561 Contracture, right knee: Secondary | ICD-10-CM | POA: Diagnosis not present

## 2021-10-29 DIAGNOSIS — I10 Essential (primary) hypertension: Secondary | ICD-10-CM | POA: Diagnosis not present

## 2021-10-29 DIAGNOSIS — Z87440 Personal history of urinary (tract) infections: Secondary | ICD-10-CM | POA: Diagnosis not present

## 2021-10-29 DIAGNOSIS — G8929 Other chronic pain: Secondary | ICD-10-CM | POA: Diagnosis not present

## 2021-10-29 DIAGNOSIS — G629 Polyneuropathy, unspecified: Secondary | ICD-10-CM | POA: Diagnosis not present

## 2021-10-29 DIAGNOSIS — G8250 Quadriplegia, unspecified: Secondary | ICD-10-CM | POA: Diagnosis not present

## 2021-10-29 DIAGNOSIS — H6123 Impacted cerumen, bilateral: Secondary | ICD-10-CM | POA: Diagnosis not present

## 2021-10-29 DIAGNOSIS — K219 Gastro-esophageal reflux disease without esophagitis: Secondary | ICD-10-CM | POA: Diagnosis not present

## 2021-10-29 DIAGNOSIS — H93293 Other abnormal auditory perceptions, bilateral: Secondary | ICD-10-CM | POA: Diagnosis not present

## 2021-10-29 DIAGNOSIS — G894 Chronic pain syndrome: Secondary | ICD-10-CM | POA: Diagnosis not present

## 2021-11-19 DIAGNOSIS — N4889 Other specified disorders of penis: Secondary | ICD-10-CM | POA: Diagnosis not present

## 2021-11-19 DIAGNOSIS — R300 Dysuria: Secondary | ICD-10-CM | POA: Diagnosis not present

## 2021-11-19 DIAGNOSIS — G894 Chronic pain syndrome: Secondary | ICD-10-CM | POA: Diagnosis not present

## 2021-11-20 DIAGNOSIS — N390 Urinary tract infection, site not specified: Secondary | ICD-10-CM | POA: Diagnosis not present

## 2021-11-20 DIAGNOSIS — H9193 Unspecified hearing loss, bilateral: Secondary | ICD-10-CM | POA: Diagnosis not present

## 2021-11-20 DIAGNOSIS — K219 Gastro-esophageal reflux disease without esophagitis: Secondary | ICD-10-CM | POA: Diagnosis not present

## 2021-11-23 DIAGNOSIS — Z87440 Personal history of urinary (tract) infections: Secondary | ICD-10-CM | POA: Diagnosis not present

## 2021-11-23 DIAGNOSIS — R319 Hematuria, unspecified: Secondary | ICD-10-CM | POA: Diagnosis not present

## 2021-11-23 DIAGNOSIS — N390 Urinary tract infection, site not specified: Secondary | ICD-10-CM | POA: Diagnosis not present

## 2021-11-23 DIAGNOSIS — M79642 Pain in left hand: Secondary | ICD-10-CM | POA: Diagnosis not present

## 2021-11-23 DIAGNOSIS — K5900 Constipation, unspecified: Secondary | ICD-10-CM | POA: Diagnosis not present

## 2021-11-23 DIAGNOSIS — N4889 Other specified disorders of penis: Secondary | ICD-10-CM | POA: Diagnosis not present

## 2021-12-01 DIAGNOSIS — H2511 Age-related nuclear cataract, right eye: Secondary | ICD-10-CM | POA: Diagnosis not present

## 2021-12-01 DIAGNOSIS — H04123 Dry eye syndrome of bilateral lacrimal glands: Secondary | ICD-10-CM | POA: Diagnosis not present

## 2021-12-01 DIAGNOSIS — Z961 Presence of intraocular lens: Secondary | ICD-10-CM | POA: Diagnosis not present

## 2021-12-01 DIAGNOSIS — H524 Presbyopia: Secondary | ICD-10-CM | POA: Diagnosis not present

## 2021-12-01 DIAGNOSIS — I10 Essential (primary) hypertension: Secondary | ICD-10-CM | POA: Diagnosis not present

## 2021-12-03 DIAGNOSIS — M25552 Pain in left hip: Secondary | ICD-10-CM | POA: Diagnosis not present

## 2021-12-03 DIAGNOSIS — M62838 Other muscle spasm: Secondary | ICD-10-CM | POA: Diagnosis not present

## 2021-12-04 DIAGNOSIS — M25552 Pain in left hip: Secondary | ICD-10-CM | POA: Diagnosis not present

## 2021-12-04 DIAGNOSIS — M79652 Pain in left thigh: Secondary | ICD-10-CM | POA: Diagnosis not present

## 2021-12-07 DIAGNOSIS — B351 Tinea unguium: Secondary | ICD-10-CM | POA: Diagnosis not present

## 2021-12-07 DIAGNOSIS — S73002D Unspecified subluxation of left hip, subsequent encounter: Secondary | ICD-10-CM | POA: Diagnosis not present

## 2021-12-07 DIAGNOSIS — G894 Chronic pain syndrome: Secondary | ICD-10-CM | POA: Diagnosis not present

## 2021-12-07 DIAGNOSIS — M6281 Muscle weakness (generalized): Secondary | ICD-10-CM | POA: Diagnosis not present

## 2021-12-07 DIAGNOSIS — G8250 Quadriplegia, unspecified: Secondary | ICD-10-CM | POA: Diagnosis not present

## 2021-12-07 DIAGNOSIS — M2041 Other hammer toe(s) (acquired), right foot: Secondary | ICD-10-CM | POA: Diagnosis not present

## 2021-12-07 DIAGNOSIS — I739 Peripheral vascular disease, unspecified: Secondary | ICD-10-CM | POA: Diagnosis not present

## 2021-12-07 DIAGNOSIS — M2042 Other hammer toe(s) (acquired), left foot: Secondary | ICD-10-CM | POA: Diagnosis not present

## 2021-12-07 DIAGNOSIS — Z712 Person consulting for explanation of examination or test findings: Secondary | ICD-10-CM | POA: Diagnosis not present

## 2021-12-07 DIAGNOSIS — Z7689 Persons encountering health services in other specified circumstances: Secondary | ICD-10-CM | POA: Diagnosis not present

## 2021-12-16 DIAGNOSIS — Z23 Encounter for immunization: Secondary | ICD-10-CM | POA: Diagnosis not present

## 2021-12-16 DIAGNOSIS — G8250 Quadriplegia, unspecified: Secondary | ICD-10-CM | POA: Diagnosis not present

## 2021-12-16 DIAGNOSIS — M24562 Contracture, left knee: Secondary | ICD-10-CM | POA: Diagnosis not present

## 2021-12-16 DIAGNOSIS — R278 Other lack of coordination: Secondary | ICD-10-CM | POA: Diagnosis not present

## 2021-12-16 DIAGNOSIS — M6281 Muscle weakness (generalized): Secondary | ICD-10-CM | POA: Diagnosis not present

## 2021-12-16 DIAGNOSIS — M24561 Contracture, right knee: Secondary | ICD-10-CM | POA: Diagnosis not present

## 2021-12-21 DIAGNOSIS — L84 Corns and callosities: Secondary | ICD-10-CM | POA: Diagnosis not present

## 2021-12-21 DIAGNOSIS — G894 Chronic pain syndrome: Secondary | ICD-10-CM | POA: Diagnosis not present

## 2021-12-23 DIAGNOSIS — R0981 Nasal congestion: Secondary | ICD-10-CM | POA: Diagnosis not present

## 2021-12-23 DIAGNOSIS — J302 Other seasonal allergic rhinitis: Secondary | ICD-10-CM | POA: Diagnosis not present

## 2021-12-25 DIAGNOSIS — M542 Cervicalgia: Secondary | ICD-10-CM | POA: Diagnosis not present

## 2021-12-25 DIAGNOSIS — G894 Chronic pain syndrome: Secondary | ICD-10-CM | POA: Diagnosis not present

## 2021-12-25 DIAGNOSIS — M62838 Other muscle spasm: Secondary | ICD-10-CM | POA: Diagnosis not present

## 2021-12-29 DIAGNOSIS — S73002D Unspecified subluxation of left hip, subsequent encounter: Secondary | ICD-10-CM | POA: Diagnosis not present

## 2021-12-29 DIAGNOSIS — M542 Cervicalgia: Secondary | ICD-10-CM | POA: Diagnosis not present

## 2021-12-29 DIAGNOSIS — G894 Chronic pain syndrome: Secondary | ICD-10-CM | POA: Diagnosis not present

## 2021-12-29 DIAGNOSIS — M25552 Pain in left hip: Secondary | ICD-10-CM | POA: Diagnosis not present

## 2022-01-04 DIAGNOSIS — M24561 Contracture, right knee: Secondary | ICD-10-CM | POA: Diagnosis not present

## 2022-01-04 DIAGNOSIS — R278 Other lack of coordination: Secondary | ICD-10-CM | POA: Diagnosis not present

## 2022-01-04 DIAGNOSIS — M24562 Contracture, left knee: Secondary | ICD-10-CM | POA: Diagnosis not present

## 2022-01-04 DIAGNOSIS — Z23 Encounter for immunization: Secondary | ICD-10-CM | POA: Diagnosis not present

## 2022-01-04 DIAGNOSIS — G8250 Quadriplegia, unspecified: Secondary | ICD-10-CM | POA: Diagnosis not present

## 2022-01-04 DIAGNOSIS — M6281 Muscle weakness (generalized): Secondary | ICD-10-CM | POA: Diagnosis not present

## 2022-01-05 DIAGNOSIS — M24562 Contracture, left knee: Secondary | ICD-10-CM | POA: Diagnosis not present

## 2022-01-05 DIAGNOSIS — G8250 Quadriplegia, unspecified: Secondary | ICD-10-CM | POA: Diagnosis not present

## 2022-01-05 DIAGNOSIS — Z23 Encounter for immunization: Secondary | ICD-10-CM | POA: Diagnosis not present

## 2022-01-05 DIAGNOSIS — M24561 Contracture, right knee: Secondary | ICD-10-CM | POA: Diagnosis not present

## 2022-01-05 DIAGNOSIS — R278 Other lack of coordination: Secondary | ICD-10-CM | POA: Diagnosis not present

## 2022-01-05 DIAGNOSIS — M6281 Muscle weakness (generalized): Secondary | ICD-10-CM | POA: Diagnosis not present

## 2022-01-06 DIAGNOSIS — M24561 Contracture, right knee: Secondary | ICD-10-CM | POA: Diagnosis not present

## 2022-01-06 DIAGNOSIS — M6281 Muscle weakness (generalized): Secondary | ICD-10-CM | POA: Diagnosis not present

## 2022-01-06 DIAGNOSIS — Z23 Encounter for immunization: Secondary | ICD-10-CM | POA: Diagnosis not present

## 2022-01-06 DIAGNOSIS — G8250 Quadriplegia, unspecified: Secondary | ICD-10-CM | POA: Diagnosis not present

## 2022-01-06 DIAGNOSIS — M24562 Contracture, left knee: Secondary | ICD-10-CM | POA: Diagnosis not present

## 2022-01-06 DIAGNOSIS — R278 Other lack of coordination: Secondary | ICD-10-CM | POA: Diagnosis not present

## 2022-01-07 DIAGNOSIS — G8250 Quadriplegia, unspecified: Secondary | ICD-10-CM | POA: Diagnosis not present

## 2022-01-07 DIAGNOSIS — M6281 Muscle weakness (generalized): Secondary | ICD-10-CM | POA: Diagnosis not present

## 2022-01-07 DIAGNOSIS — R278 Other lack of coordination: Secondary | ICD-10-CM | POA: Diagnosis not present

## 2022-01-07 DIAGNOSIS — M24562 Contracture, left knee: Secondary | ICD-10-CM | POA: Diagnosis not present

## 2022-01-07 DIAGNOSIS — Z23 Encounter for immunization: Secondary | ICD-10-CM | POA: Diagnosis not present

## 2022-01-07 DIAGNOSIS — M24561 Contracture, right knee: Secondary | ICD-10-CM | POA: Diagnosis not present

## 2022-12-18 IMAGING — CR DG HIP (WITH OR WITHOUT PELVIS) 2-3V*L*
3 series · 3 of 3 positions shown · non-contrast
Comparison: June 01, 2016.

CLINICAL DATA: Left hip pain after fall.

EXAM:
DG HIP (WITH OR WITHOUT PELVIS) 2-3V LEFT

[t pelvis ap]
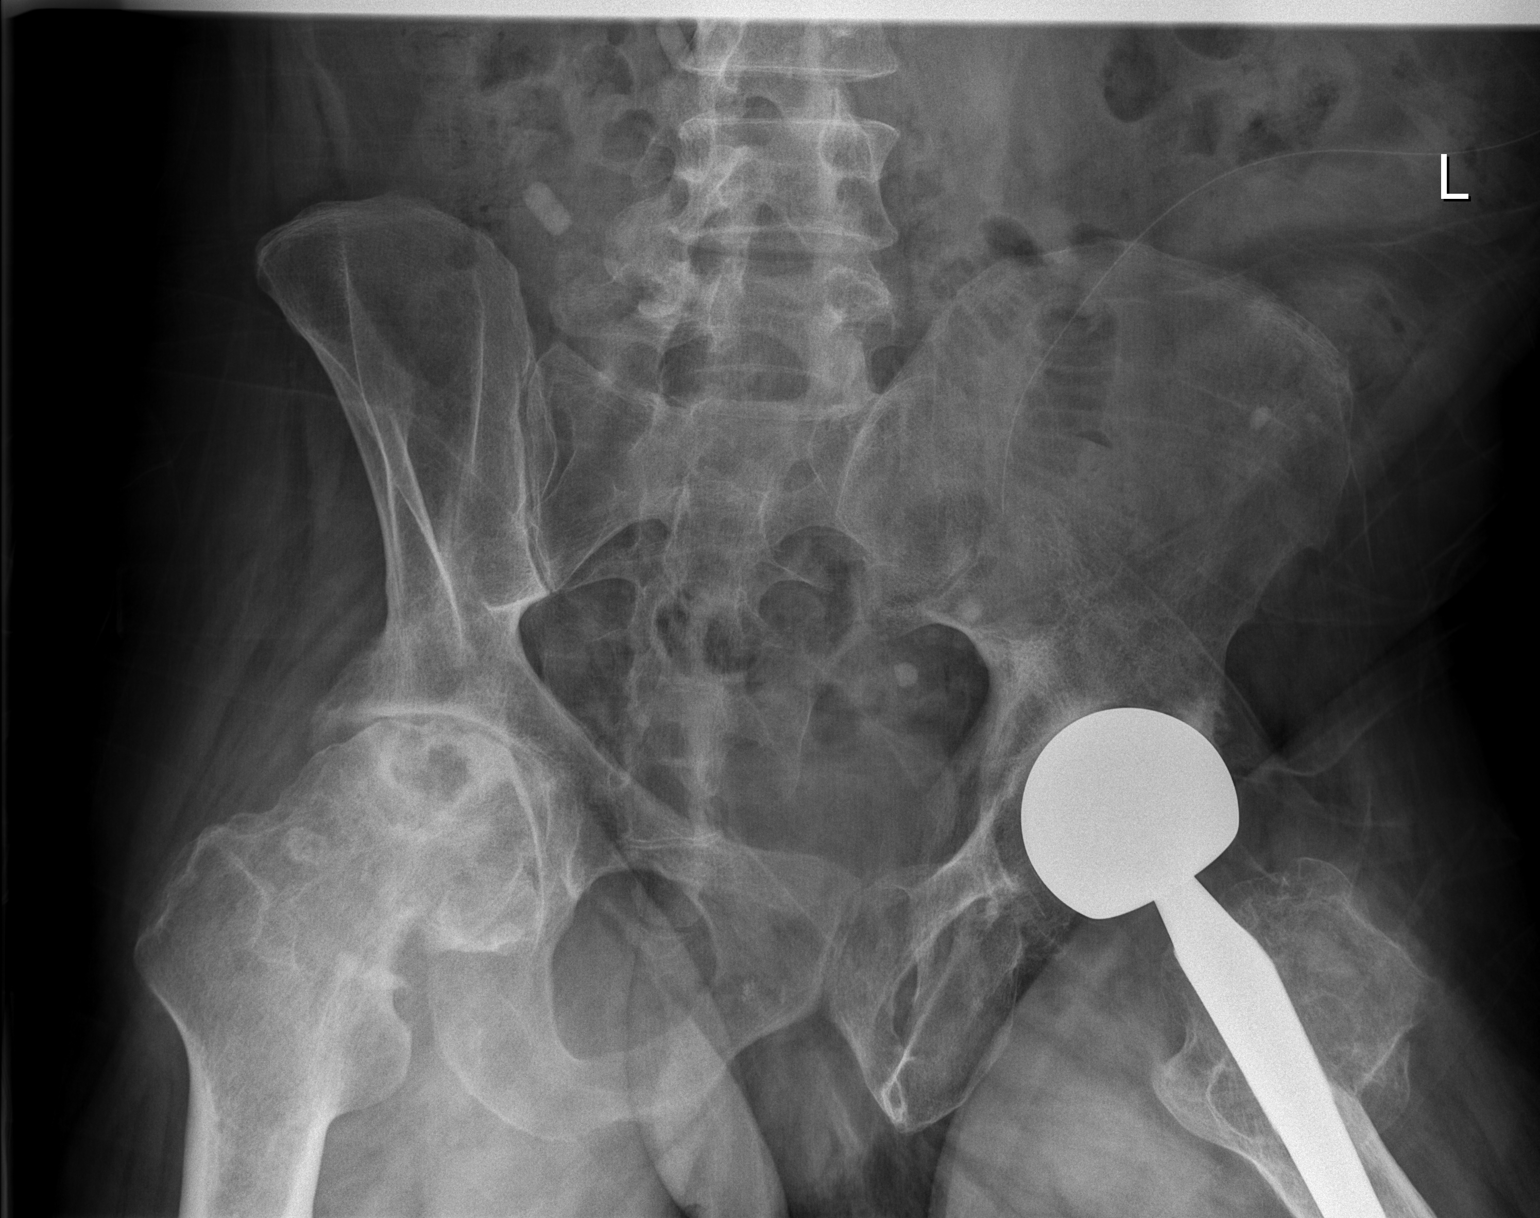

[t hip ap left]
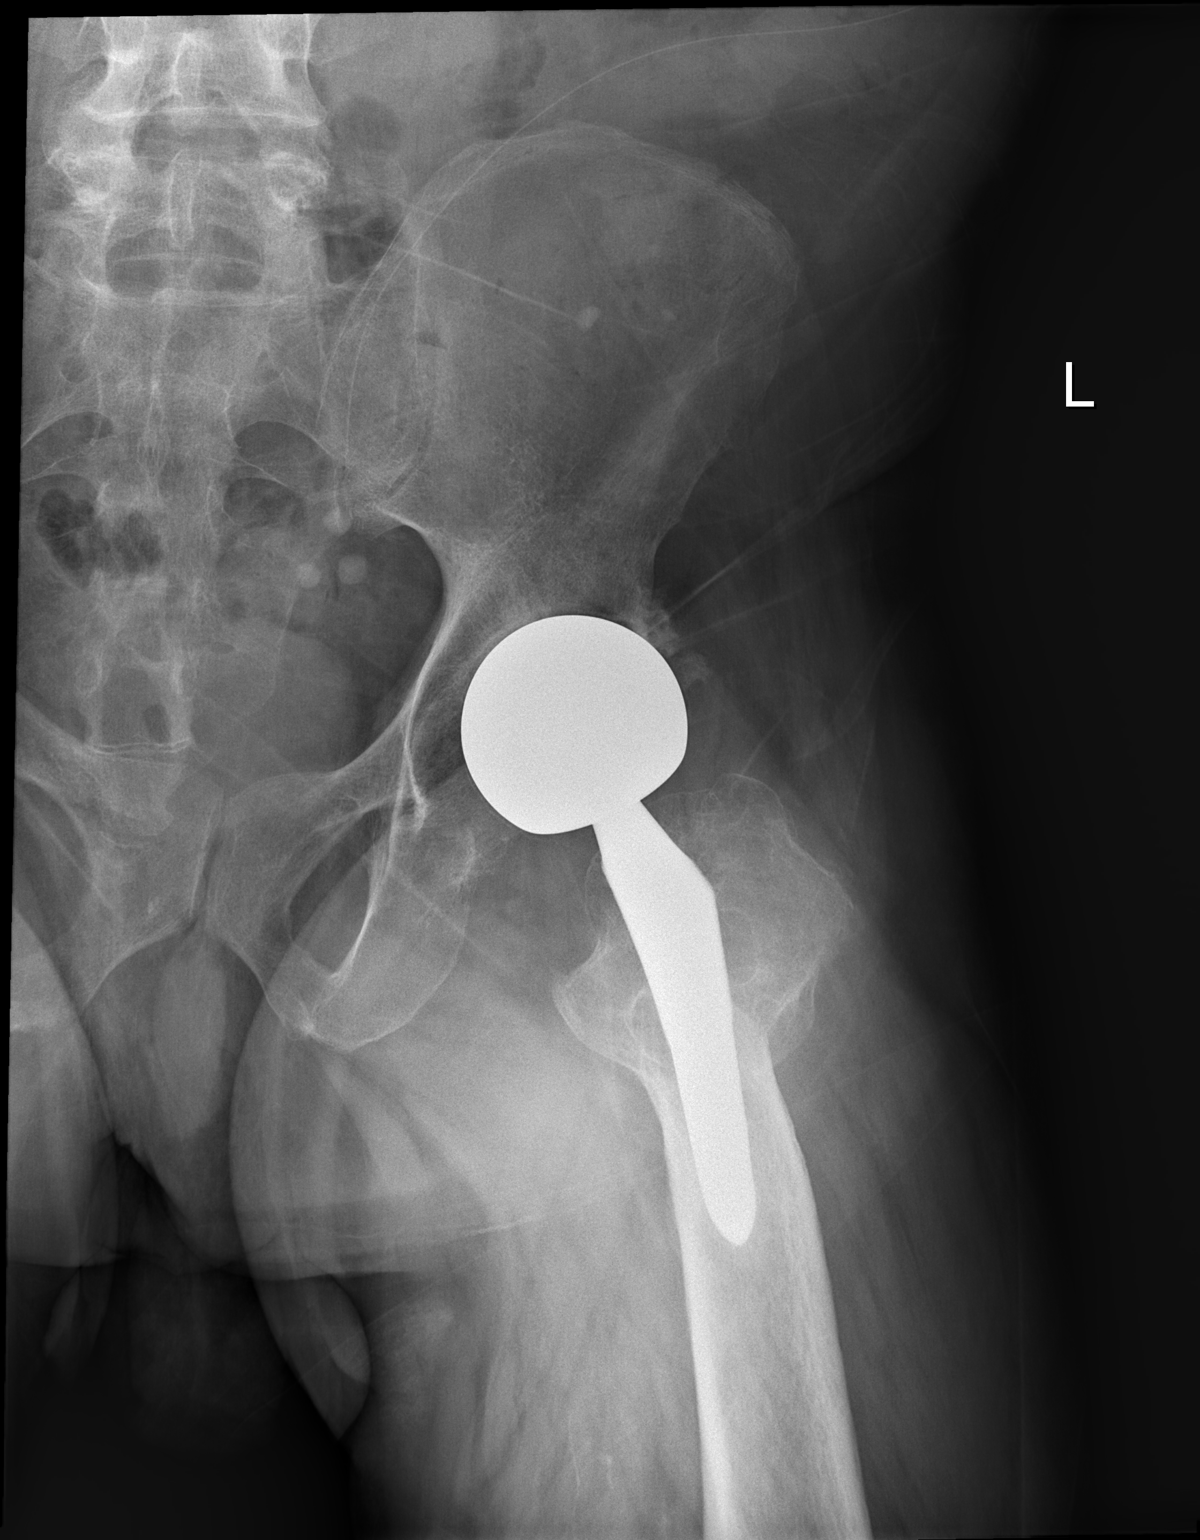

[t hip frog leg left]
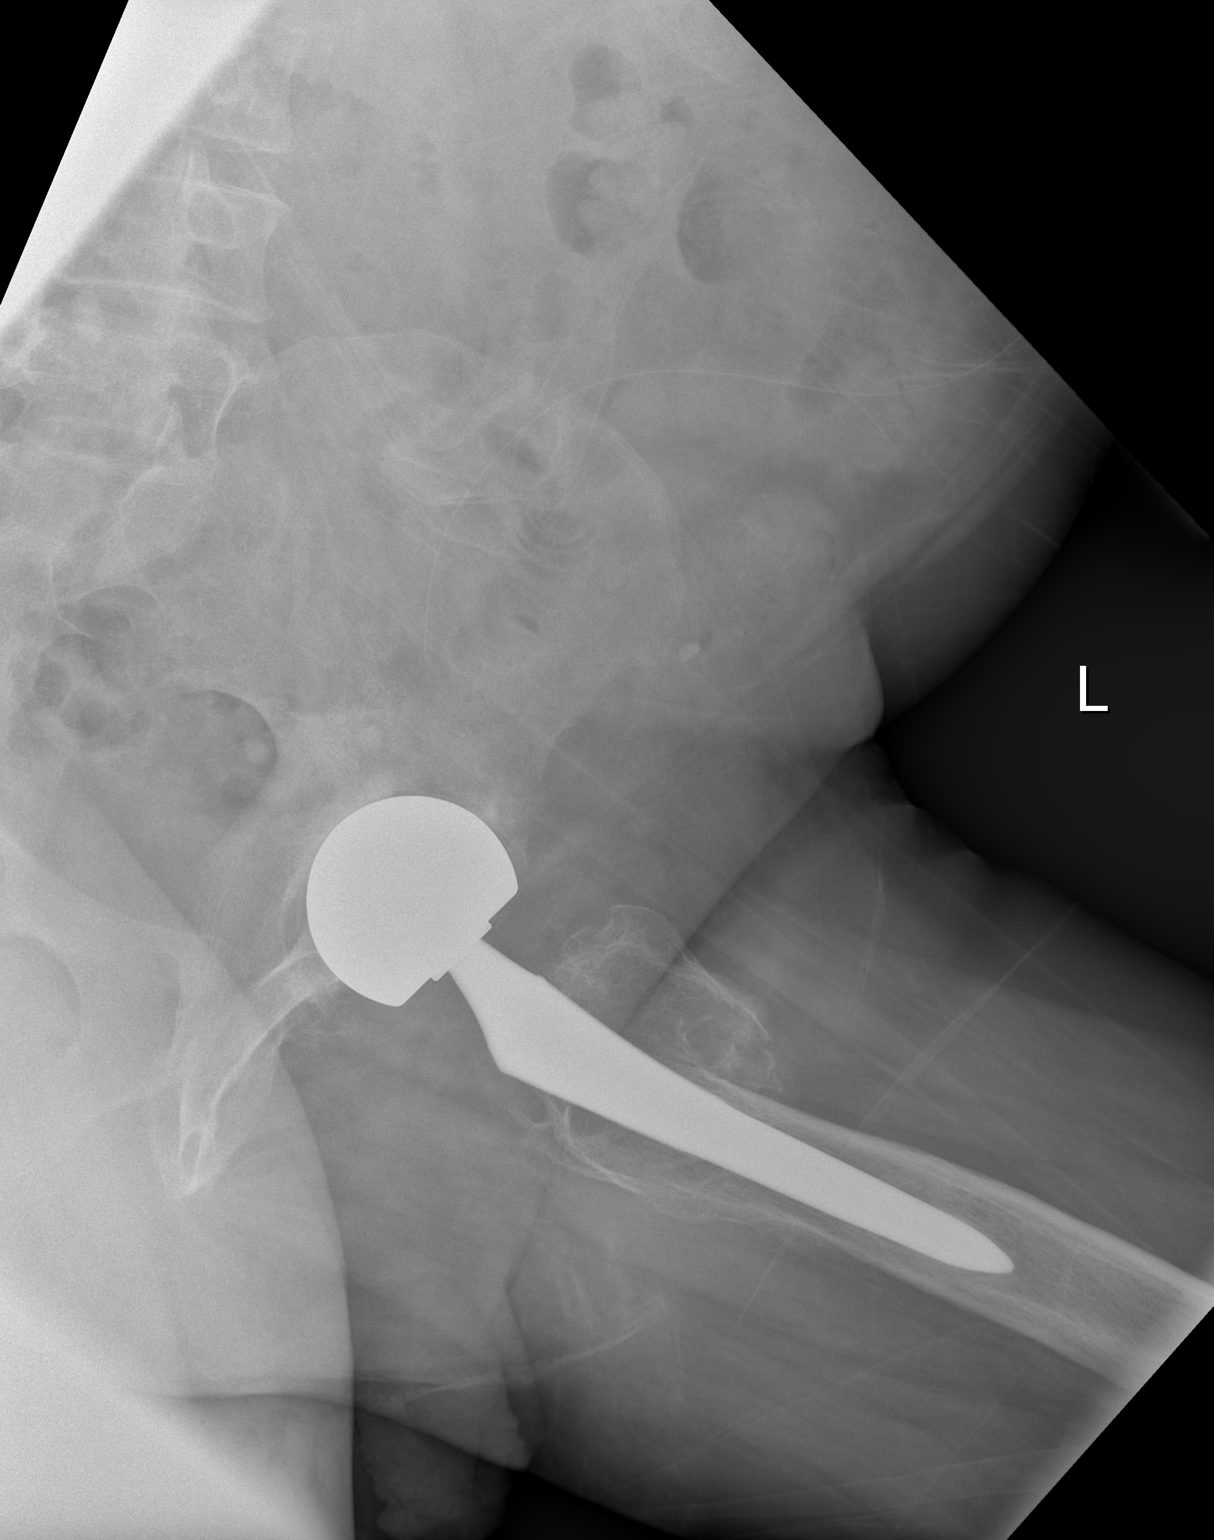

[3 of 3 positions shown; findings below may reference images not displayed]

FINDINGS: Status post left hip arthroplasty. Large amount of heterotopic bone
formation is seen around the trochanteric region. Possible mildly
displaced fracture involving a portion of this bone formation is
noted of indeterminate age. Severe degenerative changes seen
involving the right hip with probable avascular necrosis involving
the right femoral head.
IMPRESSION: Severe degenerative joint disease of the right hip with probable
avascular necrosis of the right femoral head. Status post left hip
arthroplasty. Large amount of heterotopic bone formation is noted
around the left trochanteric region, with possible mildly displaced
fracture involving a portion of this bone formation of indeterminate
age.

## 2022-12-18 IMAGING — CT CT HIP*L* W/O CM
2 of 5 series · 15 of 46 positions shown, 18 images · non-contrast
Comparison: Radiographs, same date.

CLINICAL DATA: Left hip pain. Fell 2 weeks ago. Significant
contraction deformities.

EXAM:
CT OF THE LEFT HIP WITHOUT CONTRAST
TECHNIQUE: Multidetector CT imaging of the left hip was performed according to
the standard protocol. Multiplanar CT image reconstructions were
also generated.

[Series 5: axial st · axial · 0.59mm/px · z∈[-580,-314]mm · 12 of 148 slices shown, 15 images]
[im 10/148  soft-tissue]
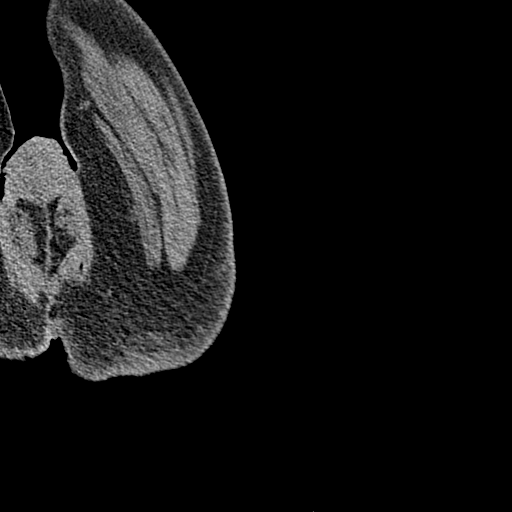
[im 10/148  bone]
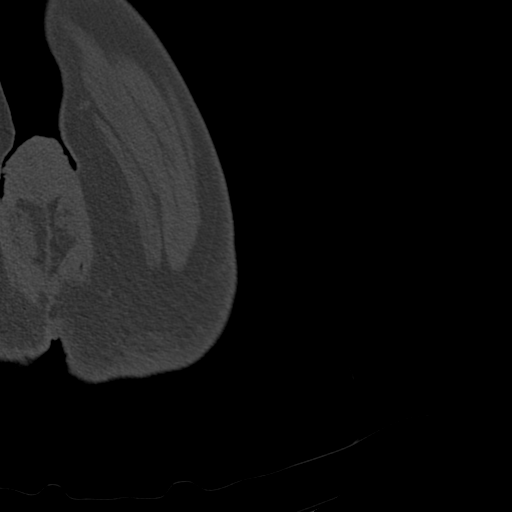
[im 29/148  soft-tissue]
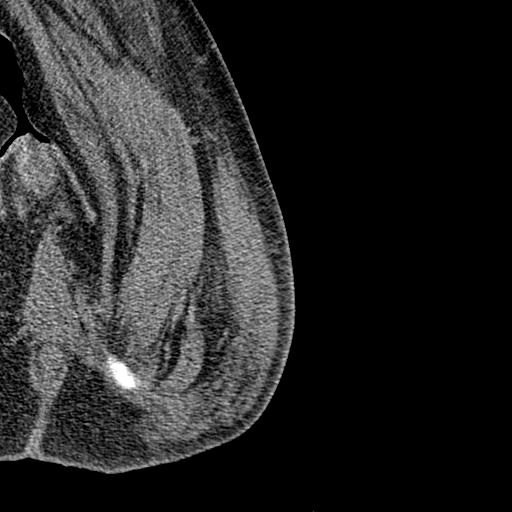
[im 43/148  soft-tissue]
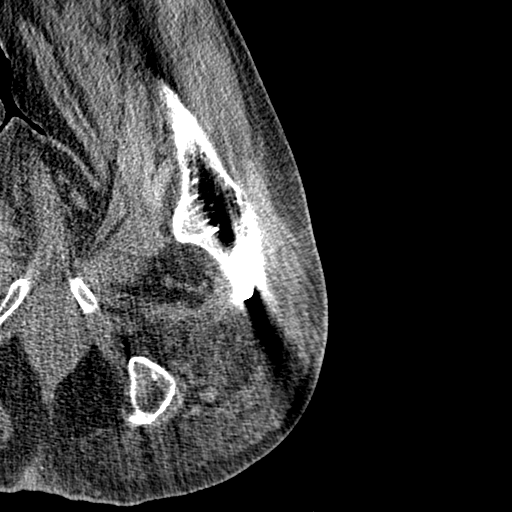
[im 57/148  soft-tissue]
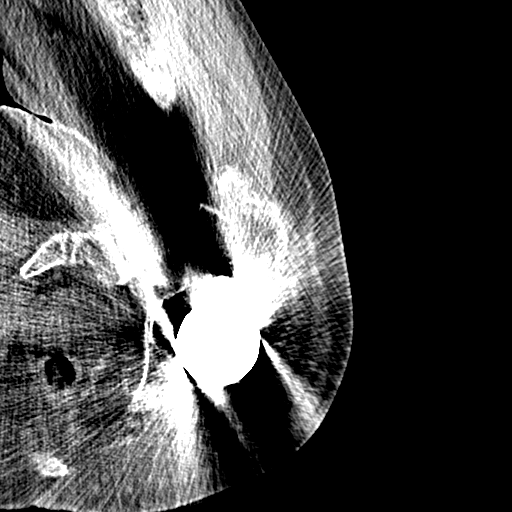
[im 76/148  soft-tissue]
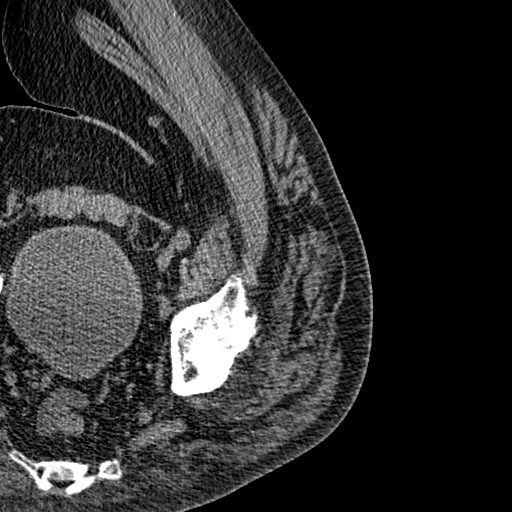
[im 91/148  soft-tissue]
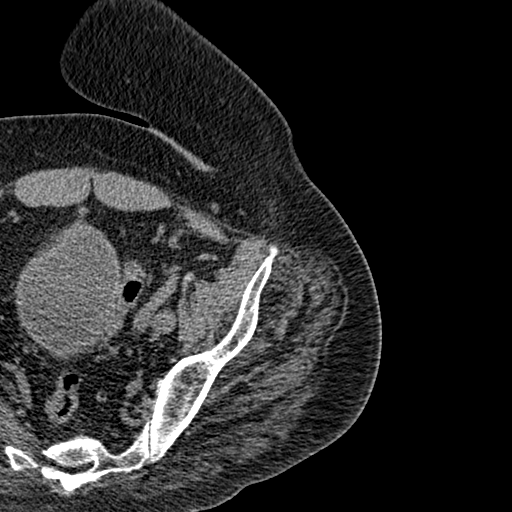
[im 105/148  soft-tissue]
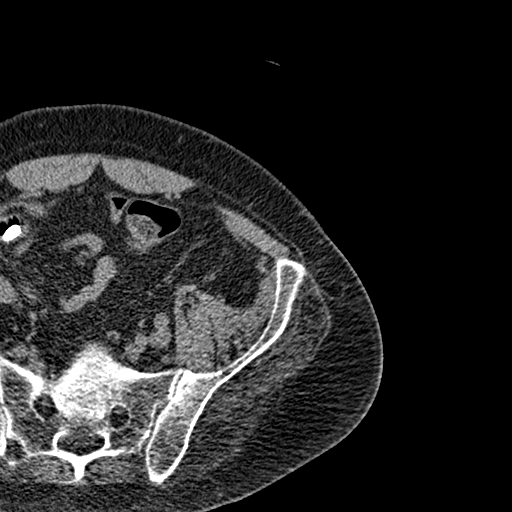
[im 124/148  soft-tissue]
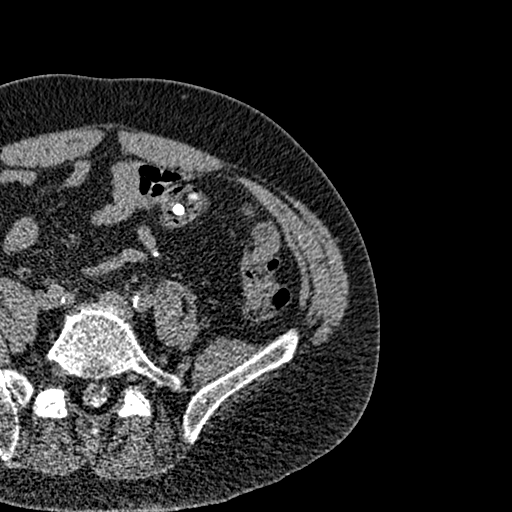
[im 129/148  lung]
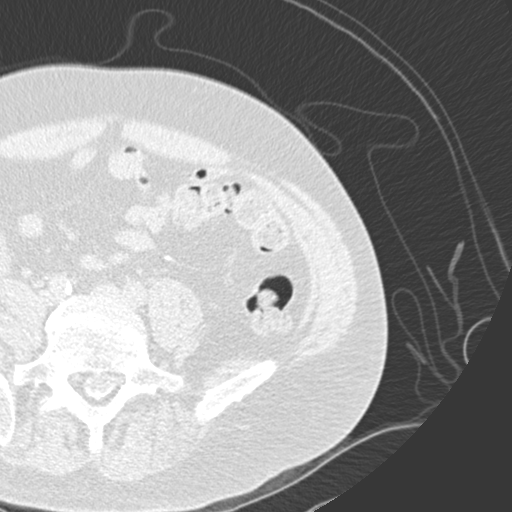
[im 133/148  lung]
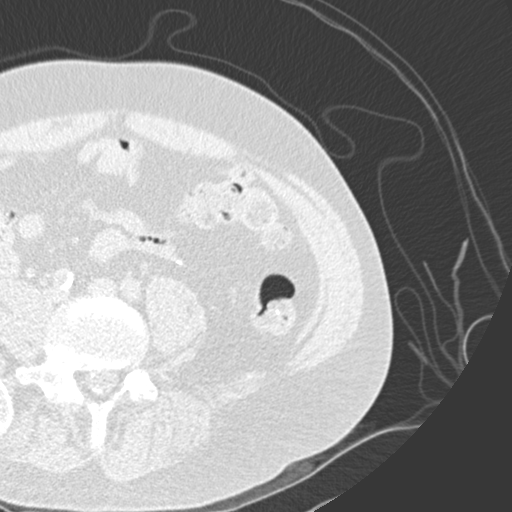
[im 138/148  soft-tissue]
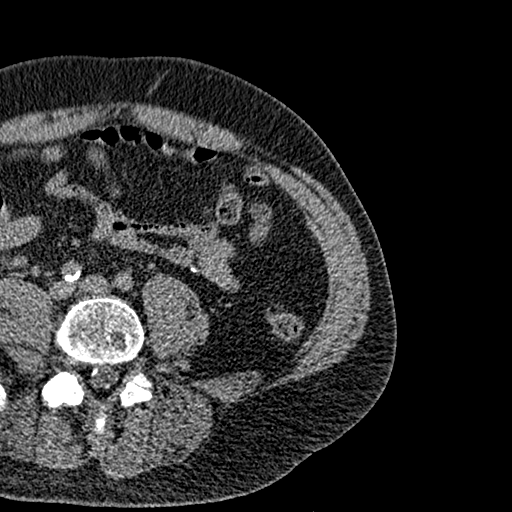
[im 138/148  lung]
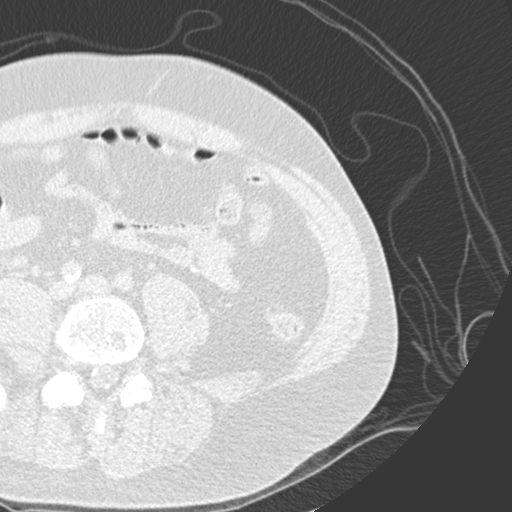
[im 138/148  bone]
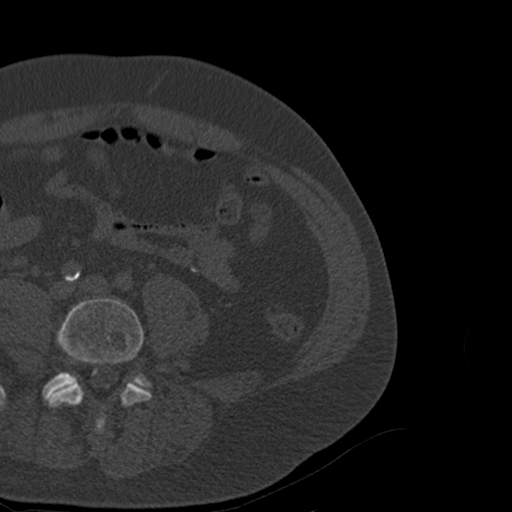
[im 143/148  lung]
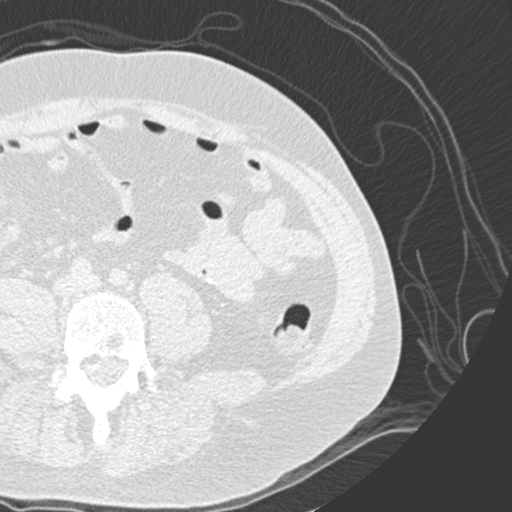

[Series 10: coronal st · coronal · 0.38mm/px · 3 of 147 slices shown]
[im 49/147  soft-tissue]
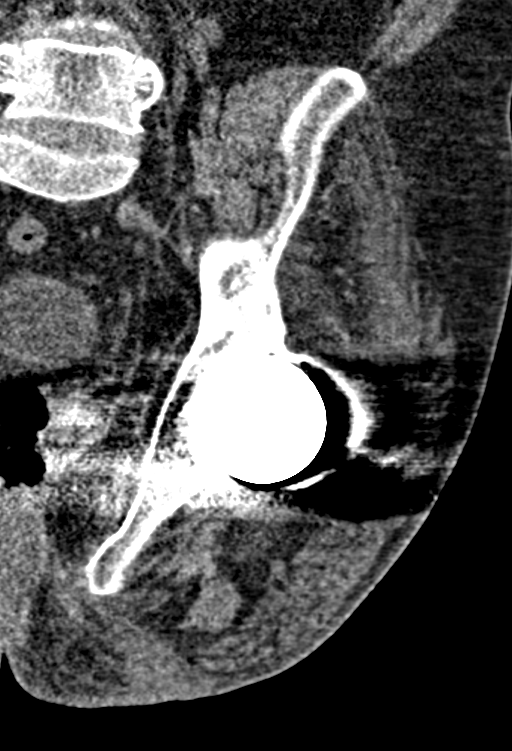
[im 74/147  soft-tissue]
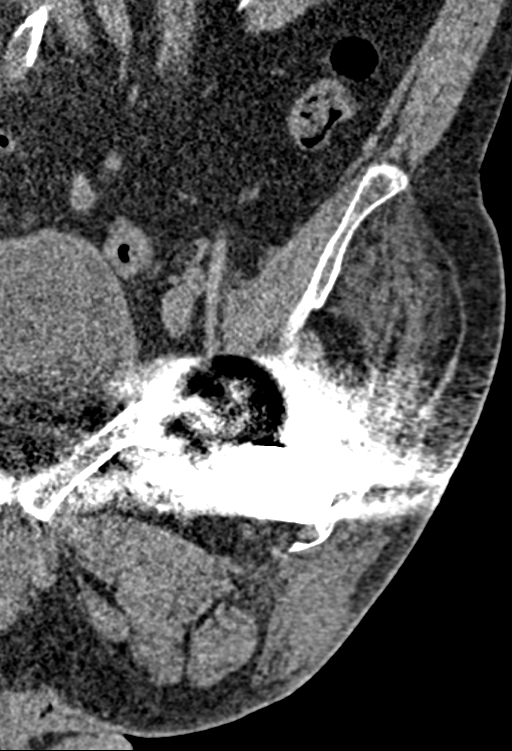
[im 98/147  soft-tissue]
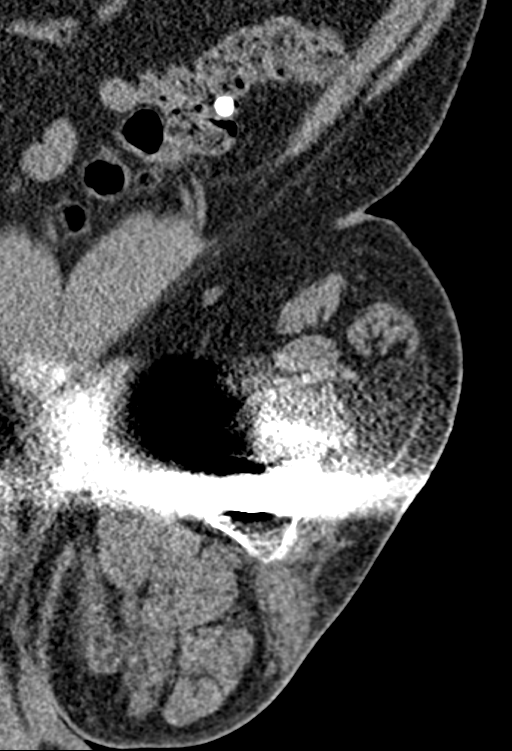

[15 of 46 positions shown; findings below may reference images not displayed]

FINDINGS: Examination is quite limited due to the left hip contracture and
metal artifact. No obvious periprosthetic hip fracture is
identified. No acetabular fracture. The visualized bony pelvis is
intact.
IMPRESSION: 1. Very limited examination due to the left hip contracture and
metal artifact.
2. No obvious periprosthetic hip fracture is identified.
